# Patient Record
Sex: Male | Born: 1945
Health system: Southern US, Community
[De-identification: ages and names within clinical notes are randomized; demographics above are authoritative.]

## PROBLEM LIST (undated history)

## (undated) DIAGNOSIS — Z7901 Long term (current) use of anticoagulants: Secondary | ICD-10-CM

## (undated) DIAGNOSIS — I10 Essential (primary) hypertension: Secondary | ICD-10-CM

## (undated) DIAGNOSIS — E785 Hyperlipidemia, unspecified: Secondary | ICD-10-CM

## (undated) DIAGNOSIS — I4891 Unspecified atrial fibrillation: Secondary | ICD-10-CM

## (undated) DIAGNOSIS — Z720 Tobacco use: Secondary | ICD-10-CM

## (undated) HISTORY — DX: Hyperlipidemia, unspecified: E78.5

## (undated) HISTORY — DX: Essential (primary) hypertension: I10

## (undated) HISTORY — DX: Unspecified atrial fibrillation: I48.91

## (undated) HISTORY — PX: CARDIOVERSION: SHX1299

## (undated) HISTORY — DX: Tobacco use: Z72.0

## (undated) HISTORY — DX: Long term (current) use of anticoagulants: Z79.01

## (undated) HISTORY — PX: TONSILLECTOMY: SHX5217

## (undated) HISTORY — PX: NOSE SURGERY: SHX723

---

## 1997-09-17 ENCOUNTER — Emergency Department (HOSPITAL_COMMUNITY): Admission: EM | Admit: 1997-09-17 | Discharge: 1997-09-17 | Payer: Self-pay | Admitting: Unknown Physician Specialty

## 1999-03-29 ENCOUNTER — Encounter: Payer: Self-pay | Admitting: Family Medicine

## 1999-03-29 ENCOUNTER — Encounter: Admission: RE | Admit: 1999-03-29 | Discharge: 1999-03-29 | Payer: Self-pay | Admitting: Family Medicine

## 2003-01-03 ENCOUNTER — Encounter: Admission: RE | Admit: 2003-01-03 | Discharge: 2003-01-03 | Payer: Self-pay | Admitting: Family Medicine

## 2003-01-03 ENCOUNTER — Encounter: Payer: Self-pay | Admitting: Family Medicine

## 2003-01-10 ENCOUNTER — Encounter: Admission: RE | Admit: 2003-01-10 | Discharge: 2003-01-10 | Payer: Self-pay | Admitting: Family Medicine

## 2003-01-10 ENCOUNTER — Encounter: Payer: Self-pay | Admitting: Family Medicine

## 2003-01-15 ENCOUNTER — Ambulatory Visit (HOSPITAL_COMMUNITY): Admission: RE | Admit: 2003-01-15 | Discharge: 2003-01-15 | Payer: Self-pay | Admitting: Cardiovascular Disease

## 2003-01-15 HISTORY — PX: CARDIAC CATHETERIZATION: SHX172

## 2003-12-02 ENCOUNTER — Encounter (INDEPENDENT_AMBULATORY_CARE_PROVIDER_SITE_OTHER): Payer: Self-pay | Admitting: Specialist

## 2003-12-02 ENCOUNTER — Ambulatory Visit (HOSPITAL_COMMUNITY): Admission: RE | Admit: 2003-12-02 | Discharge: 2003-12-02 | Payer: Self-pay | Admitting: Gastroenterology

## 2003-12-02 HISTORY — PX: COLONOSCOPY W/ POLYPECTOMY: SHX1380

## 2004-11-19 ENCOUNTER — Ambulatory Visit (HOSPITAL_COMMUNITY): Admission: RE | Admit: 2004-11-19 | Discharge: 2004-11-19 | Payer: Self-pay | Admitting: *Deleted

## 2005-09-09 ENCOUNTER — Encounter: Admission: RE | Admit: 2005-09-09 | Discharge: 2005-09-09 | Payer: Self-pay | Admitting: Family Medicine

## 2005-09-28 ENCOUNTER — Encounter: Admission: RE | Admit: 2005-09-28 | Discharge: 2005-09-28 | Payer: Self-pay | Admitting: Family Medicine

## 2009-11-17 ENCOUNTER — Ambulatory Visit: Payer: Self-pay | Admitting: Cardiovascular Disease

## 2009-12-09 ENCOUNTER — Ambulatory Visit: Payer: Self-pay | Admitting: Cardiovascular Disease

## 2010-01-06 ENCOUNTER — Ambulatory Visit: Payer: Self-pay | Admitting: Cardiovascular Disease

## 2010-02-10 ENCOUNTER — Ambulatory Visit: Payer: Self-pay | Admitting: Cardiovascular Disease

## 2010-03-10 ENCOUNTER — Ambulatory Visit: Payer: Self-pay | Admitting: Cardiology

## 2010-03-18 ENCOUNTER — Ambulatory Visit: Payer: Self-pay | Admitting: Cardiology

## 2010-04-16 ENCOUNTER — Ambulatory Visit: Payer: Self-pay | Admitting: Cardiology

## 2010-05-18 ENCOUNTER — Ambulatory Visit: Payer: Self-pay | Admitting: Cardiovascular Disease

## 2010-06-16 ENCOUNTER — Encounter (INDEPENDENT_AMBULATORY_CARE_PROVIDER_SITE_OTHER): Payer: Medicare Other

## 2010-06-16 DIAGNOSIS — I4891 Unspecified atrial fibrillation: Secondary | ICD-10-CM

## 2010-06-16 DIAGNOSIS — Z7901 Long term (current) use of anticoagulants: Secondary | ICD-10-CM

## 2010-06-30 ENCOUNTER — Encounter (INDEPENDENT_AMBULATORY_CARE_PROVIDER_SITE_OTHER): Payer: Medicare Other

## 2010-06-30 DIAGNOSIS — Z7901 Long term (current) use of anticoagulants: Secondary | ICD-10-CM

## 2010-06-30 DIAGNOSIS — I4891 Unspecified atrial fibrillation: Secondary | ICD-10-CM

## 2010-07-13 ENCOUNTER — Ambulatory Visit (INDEPENDENT_AMBULATORY_CARE_PROVIDER_SITE_OTHER): Payer: Medicare Other | Admitting: *Deleted

## 2010-07-13 DIAGNOSIS — Z7901 Long term (current) use of anticoagulants: Secondary | ICD-10-CM

## 2010-07-13 DIAGNOSIS — I4891 Unspecified atrial fibrillation: Secondary | ICD-10-CM

## 2010-08-10 ENCOUNTER — Encounter: Payer: Medicare Other | Admitting: *Deleted

## 2010-08-24 ENCOUNTER — Ambulatory Visit (INDEPENDENT_AMBULATORY_CARE_PROVIDER_SITE_OTHER): Payer: Medicare Other | Admitting: *Deleted

## 2010-08-24 DIAGNOSIS — I4891 Unspecified atrial fibrillation: Secondary | ICD-10-CM

## 2010-08-24 LAB — POCT INR: INR: 2.9

## 2010-09-03 NOTE — H&P (Signed)
NAME:  John Buckley, John Buckley                        ACCOUNT NO.:  [id number]   MEDICAL RECORD NO.:  [id number]                   PATIENT TYPE:  OIB   LOCATION:  NA                                   FACILITY:  [facility]   PHYSICIAN:  Vesta Mixer, M.D.              DATE OF BIRTH:  02-16-1946   DATE OF ADMISSION:  DATE OF DISCHARGE:                                HISTORY & PHYSICAL   HISTORY OF PRESENT ILLNESS:  [name] is a 65 year old gentleman with a  history of hypertension and hypercholesterolemia.  He was recently seen in  our office for a treadmill test for further evaluation of some shortness of  breath.   [name] has had some fatigue and shortness of breath over the past  several months.  It all started as what sounded like a viral illness.  He  stated that most of these symptoms resolved but he still has some mild  shortness of breath.  He has never had any real episodes of chest  discomfort.  These episodes of shortness of breath typically occur with  exertion.  They are not associated with eating, drinking, changing position,  or taking a deep breath.   CURRENT MEDICATIONS:  1. Norvasc 10 mg a day.  2. Doxazosin 2 mg a day.  3. Lipitor 40 mg a day.  4. Aspirin 325 mg a day.   ALLERGIES:  No known drug allergies.   PAST MEDICAL HISTORY:  1. Hypertension.  2. Hypercholesterolemia.   SOCIAL HISTORY:  The patient works in Training and development officer for [employer].  He  does not get any regular exercise.  He smoked one-and-a-half packs of  cigarettes for the past 31 years.  He drinks beer regularly.   FAMILY HISTORY:  His father is 50 years old and still alive.  His mother is  62 years old and has Alzheimer's disease.   REVIEW OF SYSTEMS:  His review of systems was reviewed.  He denies any  problems with his eyes, ears, nose, and throat.  He denies any heat or cold  intolerance, or weight gain or weight loss.  He denies any cough or sputum  production.  He has the  shortness of breath as noted above.  He denies any  PND, orthopnea, syncope, or presyncope.  He denies any GI or GU problems.  He denies any rash or skin nodules.  His review of systems was reviewed and  is otherwise negative.   PHYSICAL EXAMINATION:  GENERAL:  He is a middle-aged gentleman in no acute  distress.  He is alert and oriented x3 and his mood and affect are normal.  VITAL SIGNS:  His weight is 293.  His blood pressure is 138/80 with a heart  rate of 72.  HEENT:  Reveals 2+ carotids.  He has no bruits.  There is no JVD, no  thyromegaly.  LUNGS:  Clear to auscultation.  HEART:  Regular rate, S1, S2.  He has no murmurs, gallops, or rubs.  ABDOMEN:  Reveals good bowel sounds and is nontender.  EXTREMITIES:  He has no clubbing, cyanosis, or edema.  NEUROLOGIC:  Nonfocal.   LABORATORY DATA:  The stress test today:  The patient walked for eight  minutes which is two minutes into stage 3.  He stopped due to generalized  fatigue and denied any chest pain.  He did have a moderate amount of  dyspnea.  He had several PVCs and several couplets during and after the  treadmill test.  At peak exercise he had no ST or T wave changes to suggest  ischemia.  He had one three-beat run of nonsustained ventricular tachycardia  during the recovery phase.   This is interpreted as an abnormal stress test because of his marked  ventricular ectopy.  I think that he is at increased risk for having  cardiovascular complications given this ectopy.  I have recommended that we  proceed with heart catheterization.  We discussed the risks, benefits, and  options of heart catheterization.  He understands and agrees to proceed.                                                Vesta Mixer, M.D.    PJN/MEDQ  D:  01/13/2003  T:  01/13/2003  Job:  [id number]   cc:   Talmadge Coventry, M.D.  [address]  [place]  [place] [address]  Fax: [phone]

## 2010-09-03 NOTE — H&P (Signed)
NAME:  John, Buckley NO.:  [id number]   MEDICAL RECORD NO.:  [id number]          PATIENT TYPE:  OIB   LOCATION:                               FACILITY:  [facility]   PHYSICIAN:  Vesta Mixer, M.D. DATE OF BIRTH:  [birth year]   DATE OF ADMISSION:  11/19/2004  DATE OF DISCHARGE:                                HISTORY & PHYSICAL   John Buckley is a middle-aged gentleman with a history of atrial fibrillation.  He  is now admitted to the hospital for elective cardioversion.   John Buckley has a history of hypertension, hypercholesterolemia, and has  had an abnormal stress test.  He had a heart catheterization back in  September 2004 which revealed only minor luminal coronary artery  irregularities.  He had atrial fibrillation recently.  We have had him on  Coumadin, and he has done fairly well.  He has not had any episodes of chest  pain, shortness of breath, syncope, or presyncope. He has been therapeutic  on his Coumadin now for several weeks and is scheduled for cardioversion.   CURRENT MEDICATIONS:  1.  Doxazosin 1 mg a day.  2.  Lipitor 40 mg a day.  3.  Aspirin 81 mg a day.  4.  Lotrel 10 mg/20 mg once a day.  5.  Coumadin 5 mg 7 days a week.  6.  Multivitamin once a day.  7.  Toprol XL 50 mg a day.  8.  Digoxin 0.25 mg a day.   PHYSICAL EXAMINATION:  GENERAL:  He is a middle-aged gentleman in no acute  distress.  He is alert and oriented x 3, and his mood and affect are normal.  VITAL SIGNS: Weight 280.  Blood pressure 110/70, heart rate 68.  HEENT:  Reveals 2+ carotids.  He has no bruits, no JVD, no thyromegaly.  LUNGS:  Clear to auscultation.  HEART:  Irregularly irregular.  ABDOMEN:  Obese but otherwise benign.  EXTREMITIES:  No clubbing, cyanosis, or edema.  NEUROLOGIC:  Exam was nonfocal.   ASSESSMENT AND PLAN:  Overall, John Buckley is doing fairly well.  We have  scheduled him for a cardioversion. We discussed the risks, benefits, and  options  concerning cardioversion.  He understands and agrees to proceed.   Once again I have reminded him to quit smoking.  I think that this will help  reduce his cardiovascular risks.  I will see him back in the office a week  or so following the cardioversion.           ______________________________  Vesta Mixer, M.D.     PJN/MEDQ  D:  11/02/2004  T:  11/02/2004  Job:  [id number]   cc:   Talmadge Coventry, M.D.  [address]  [place]  [place] [address]  Fax: [phone]

## 2010-09-03 NOTE — Op Note (Signed)
NAME:  John Buckley, John Buckley                        ACCOUNT NO.:  [id number]   MEDICAL RECORD NO.:  [id number]                   PATIENT TYPE:  AMB   LOCATION:  ENDO                                 FACILITY:  [facility]   PHYSICIAN:  John C. Madilyn Fireman, M.D.                 DATE OF BIRTH:  1945/05/22   DATE OF PROCEDURE:  12/02/2003  DATE OF DISCHARGE:                                 OPERATIVE REPORT   PROCEDURE:  Colonoscopy with polypectomy.   INDICATION FOR PROCEDURE:  Family history of colon cancer in a first degree  relative.   PROCEDURE:  The patient was placed in the left lateral decubitus position  and placed on a pulse monitor with continuous low-flow oxygen delivered by  nasal cannula.  He was sedated with 100 mcg IV fentanyl and 10 mg IV Versed.  The Olympus video colonoscope was inserted into the rectum and advanced to  the cecum, confirmed by transillumination of McBurney's point and  visualization of the ileocecal valve and the appendiceal orifice.  The prep  was fairly good but suboptimal in some areas, and I could not rule out  lesions less than 1 cm in all areas.  Otherwise, the cecum, ascending, and  transverse colon all appeared normal with no masses, polyps, diverticula, or  other mucosal abnormalities.  Within the descending and sigmoid colon there  were seen a few scattered diverticula.  In the rectum at 15 cm there was  seen a vaguely-delineated sessile polyp approximately 1.2-1.4 cm in  diameter.  An attempt was made to remove it with a snare, but the snare  inadvertently cut through part of the polyp, causing some bleeding and  obscuring the view thereafter.  I then snared what appeared to be the  remainder of the polyp, although it was somewhat vaguely delineated from the  surrounding mucosa and applied another snare and removed a second fragment.  It was not entirely clear whether the entire lesion was removed.  The  remainder of the rectum appeared normal.  The scope was  then withdrawn and  the patient returned to the recovery room in stable condition.  He tolerated  the procedure well, and there were no immediate complications.   IMPRESSION:  A moderate-sized rectal polyp at 15 cm.   PLAN:  Await histology to rule out malignancy and if villous component, will  probably repeat flexible sigmoidoscopy in six weeks to ensure that the  lesion was completely removed.                                               John C. Madilyn Fireman, M.D.    JCH/MEDQ  D:  12/02/2003  T:  12/02/2003  Job:  [id number]   cc:   Talmadge Coventry, M.D.  [address]  [place]  [place] [address]  Fax: [phone]

## 2010-09-03 NOTE — Cardiovascular Report (Signed)
   NAME:  John Buckley, John Buckley                        ACCOUNT NO.:  [id number]   MEDICAL RECORD NO.:  [id number]                   PATIENT TYPE:  OIB   LOCATION:  2855                                 FACILITY:  [facility]   PHYSICIAN:  Vesta Mixer, M.D.              DATE OF BIRTH:  02/26/1946   DATE OF PROCEDURE:  01/15/2003  DATE OF DISCHARGE:                              CARDIAC CATHETERIZATION   INDICATIONS:  [name] is a 65 year old gentleman who was referred for  heart catheterization after having an abnormal stress test.   PROCEDURE:  Left heart catheterization with coronary angiography.   The right femoral artery was easily cannulated using a modified Seldinger  technique.   HEMODYNAMICS:  The left ventricular pressure was 159/11 with an aortic  pressure of 159/116.   ANGIOGRAPHY:  The left main coronary artery is smooth and normal.   The left anterior descending artery has minor luminal irregularities, but is  otherwise normal.  The first diagonal vessel was normal.   The left circumflex artery is a large vessel.  There is a 20% stenosis at  the takeoff of the left circumflex artery but it is otherwise normal.   The ramus intermediate vessel is relatively normal.   The right coronary artery is large and dominant.  There are a few minor  luminal irregularities but the vessel is otherwise normal.   The posterior descending artery and the posterolateral segment artery are  normal.   LEFT VENTRICULOGRAM:  The left ventriculogram was performed in a 30 RAO  position.  It reveals normal left ventricular systolic function.  There are  no segmental wall motion abnormalities.  The ejection fraction is around  65%.   COMPLICATIONS:  None.    CONCLUSION:  1. Relatively smooth coronary arteries.  He has a few minor luminal     irregularities.  2. Normal left ventricular systolic function.                                               Vesta Mixer, M.D.    PJN/MEDQ  D:  01/15/2003  T:  01/15/2003  Job:  [id number]   cc:   Talmadge Coventry, M.D.  [address]  [place]  [place] [address]  Fax: [phone]

## 2010-09-21 ENCOUNTER — Ambulatory Visit (INDEPENDENT_AMBULATORY_CARE_PROVIDER_SITE_OTHER): Payer: Medicare Other | Admitting: *Deleted

## 2010-09-21 DIAGNOSIS — I4891 Unspecified atrial fibrillation: Secondary | ICD-10-CM

## 2010-10-19 ENCOUNTER — Ambulatory Visit (INDEPENDENT_AMBULATORY_CARE_PROVIDER_SITE_OTHER): Payer: Medicare Other | Admitting: *Deleted

## 2010-10-19 DIAGNOSIS — I4891 Unspecified atrial fibrillation: Secondary | ICD-10-CM

## 2010-11-04 ENCOUNTER — Encounter: Payer: Self-pay | Admitting: *Deleted

## 2010-11-05 ENCOUNTER — Ambulatory Visit (INDEPENDENT_AMBULATORY_CARE_PROVIDER_SITE_OTHER): Payer: Medicare Other | Admitting: Cardiovascular Disease

## 2010-11-05 ENCOUNTER — Encounter: Payer: Self-pay | Admitting: Cardiovascular Disease

## 2010-11-05 VITALS — BP 135/95 | HR 56 | Ht 74.0 in | Wt 292.0 lb

## 2010-11-05 DIAGNOSIS — I4891 Unspecified atrial fibrillation: Secondary | ICD-10-CM

## 2010-11-05 DIAGNOSIS — E785 Hyperlipidemia, unspecified: Secondary | ICD-10-CM

## 2010-11-05 DIAGNOSIS — I10 Essential (primary) hypertension: Secondary | ICD-10-CM

## 2010-11-05 DIAGNOSIS — E669 Obesity, unspecified: Secondary | ICD-10-CM

## 2010-11-05 MED ORDER — LISINOPRIL 20 MG PO TABS: 20.0000 mg | ORAL_TABLET | Freq: Every day | ORAL | Status: DC

## 2010-11-05 NOTE — Assessment & Plan Note (Signed)
His rhythm remained stable. We'll continue with the same medications.

## 2010-11-05 NOTE — Assessment & Plan Note (Signed)
He is to continue with his atorvastatin. We'll check a fasting lipid profile today and again in 6 months.

## 2010-11-05 NOTE — Progress Notes (Signed)
John Buckley Date of Birth  01-Feb-1946 [facility] / [facility] [address]     [address] [place], [place]  [address] [phone]  Fax  [phone]  History of Present Illness:  John Buckley is a 65 year old gentleman with a history of atrial fibrillation, obesity, hyperlipidemia, chronic anticoagulation, hypertension, and cigarette smoking. He has done very well since I last saw him. His heart rhythm has stayed fairly stable.  Current Outpatient Prescriptions on File Prior to Visit  Medication Sig Dispense Refill  . amLODipine (NORVASC) 5 MG tablet Take 2.5 mg by mouth daily.       Marland Kitchen aspirin 81 MG tablet Take 81 mg by mouth daily.        Marland Kitchen atorvastatin (LIPITOR) 40 MG tablet Take 40 mg by mouth daily.       . Cholecalciferol (VITAMIN D3) 2000 UNITS capsule Take 4,000 Units by mouth daily.        . digoxin (LANOXIN) 0.25 MG tablet Take 250 mcg by mouth daily.       . metoprolol (TOPROL-XL) 200 MG 24 hr tablet Take 200 mg by mouth daily.       . multivitamin (THERAGRAN) per tablet Take 1 tablet by mouth daily.        Marland Kitchen warfarin (COUMADIN) 5 MG tablet Take by mouth. Take as directed by the [facility]        No Known Allergies  Past Medical History  Diagnosis Date  . Hyperlipidemia   . Hypertension   . Atrial fibrillation   . Long-term (current) use of anticoagulants   . Tobacco abuse     Past Surgical History  Procedure Date  . Cardiac catheterization 01/15/2003    Est. EF is around 65% --  Relatively smooth coronary arteries.  He has a few minor luminal irregularities -- Normal left ventricular systolic function -- John Buckley, M.D.  . Colonoscopy w/ polypectomy 12/02/2003    A moderate-sized rectal polyp at 15 cm -- ohn C. Madilyn Buckley, M.D.  . Cardioversion 08/04/20006    was successful  . Tonsillectomy   . Nose surgery     History  Smoking status  . Current Everyday Smoker -- 0.5 packs/day for 39 years  . Types: Cigarettes  Smokeless  tobacco  . Not on file    History  Alcohol Use No    Family History  Problem Relation Age of Onset  . Alzheimer's disease Mother   . Colon cancer      family history of     Reviw of Systems:  Reviewed in the HPI.  All other systems are negative.  Physical Exam: BP 135/95  Pulse 56  Ht 6\' 2"  (1.88 m)  Wt 292 lb (132.45 kg)  BMI 37.49 kg/m2 The patient is alert and oriented x 3.  The mood and affect are normal.   Skin: warm and dry.  Color is normal.    HEENT:   the sclera are nonicteric.  The mucous membranes are moist.  The carotids are 2+ without bruits.  There is no thyromegaly.  There is no JVD.    Lungs: clear.  The chest wall is non tender.    Heart: regular rate with a normal S1 and S2.  There are no murmurs, gallops, or rubs. The PMI is not displaced.     Abdomin: good bowel sounds.  There is no guarding or rebound.  There is no hepatosplenomegaly or tenderness.  There are no masses.  Extremities:  no clubbing, cyanosis, or edema.  The legs are without rashes.  The distal pulses are intact.   Neuro:  Cranial nerves II - XII are intact.  Motor and sensory functions are intact.    The gait is normal.  ECG:  Assessment / Plan:

## 2010-11-05 NOTE — Assessment & Plan Note (Signed)
His blood pressure remains elevated today. We'll increase his Isordil to 20 mg a day. We may need to start HCTZ and potassium at his next visit.  I've asked him to exercise on a regular basis and to make sure he watches his salt. Rest and to lose weight which will help with his blood pressure control.

## 2010-11-10 ENCOUNTER — Other Ambulatory Visit (INDEPENDENT_AMBULATORY_CARE_PROVIDER_SITE_OTHER): Payer: Medicare Other | Admitting: *Deleted

## 2010-11-10 LAB — HEPATIC FUNCTION PANEL
Albumin: 4.6 g/dL (ref 3.5–5.2)
Alkaline Phosphatase: 87 U/L (ref 39–117)
Total Bilirubin: 1 mg/dL (ref 0.3–1.2)
Total Protein: 7.5 g/dL (ref 6.0–8.3)

## 2010-11-10 LAB — BASIC METABOLIC PANEL
BUN: 20 mg/dL (ref 6–23)
Glucose, Bld: 98 mg/dL (ref 70–99)
Potassium: 4.4 mEq/L (ref 3.5–5.1)
Sodium: 142 mEq/L (ref 135–145)

## 2010-11-10 LAB — LIPID PANEL
Cholesterol: 136 mg/dL (ref 0–200)
LDL Cholesterol: 59 mg/dL (ref 0–99)
Triglycerides: 156 mg/dL — ABNORMAL HIGH (ref 0.0–149.0)

## 2010-11-11 ENCOUNTER — Telehealth: Payer: Self-pay | Admitting: *Deleted

## 2010-11-11 NOTE — Telephone Encounter (Signed)
Message copied by Antony Odea on Thu Nov 11, 2010  4:59 PM ------      Message from: Vesta Mixer      Created: Thu Nov 11, 2010  6:01 AM       Needs to continue a low fat diet and lose weight to lower trigs

## 2010-11-11 NOTE — Telephone Encounter (Signed)
Patient called with lab results. Pt verbalized understanding. Jodette Timohty Renbarger RN  

## 2010-11-16 ENCOUNTER — Ambulatory Visit (INDEPENDENT_AMBULATORY_CARE_PROVIDER_SITE_OTHER): Payer: Medicare Other | Admitting: *Deleted

## 2010-11-16 DIAGNOSIS — I4891 Unspecified atrial fibrillation: Secondary | ICD-10-CM

## 2010-11-16 LAB — POCT INR: INR: 3.4

## 2010-12-13 ENCOUNTER — Ambulatory Visit (INDEPENDENT_AMBULATORY_CARE_PROVIDER_SITE_OTHER): Payer: Medicare Other | Admitting: *Deleted

## 2010-12-13 DIAGNOSIS — I4891 Unspecified atrial fibrillation: Secondary | ICD-10-CM

## 2010-12-13 LAB — POCT INR: INR: 2.9

## 2011-01-10 ENCOUNTER — Ambulatory Visit (INDEPENDENT_AMBULATORY_CARE_PROVIDER_SITE_OTHER): Payer: Medicare Other | Admitting: *Deleted

## 2011-01-10 DIAGNOSIS — I4891 Unspecified atrial fibrillation: Secondary | ICD-10-CM

## 2011-01-10 LAB — POCT INR: INR: 2.4

## 2011-02-07 ENCOUNTER — Ambulatory Visit (INDEPENDENT_AMBULATORY_CARE_PROVIDER_SITE_OTHER): Payer: Medicare Other | Admitting: *Deleted

## 2011-02-07 DIAGNOSIS — I4891 Unspecified atrial fibrillation: Secondary | ICD-10-CM

## 2011-03-21 ENCOUNTER — Ambulatory Visit (INDEPENDENT_AMBULATORY_CARE_PROVIDER_SITE_OTHER): Payer: Medicare Other | Admitting: *Deleted

## 2011-03-21 DIAGNOSIS — I4891 Unspecified atrial fibrillation: Secondary | ICD-10-CM

## 2011-03-21 LAB — POCT INR: INR: 2.5

## 2011-05-04 ENCOUNTER — Ambulatory Visit (INDEPENDENT_AMBULATORY_CARE_PROVIDER_SITE_OTHER): Payer: Medicare Other | Admitting: *Deleted

## 2011-05-04 ENCOUNTER — Encounter: Payer: Self-pay | Admitting: Cardiovascular Disease

## 2011-05-04 ENCOUNTER — Ambulatory Visit (INDEPENDENT_AMBULATORY_CARE_PROVIDER_SITE_OTHER): Payer: Medicare Other | Admitting: Cardiovascular Disease

## 2011-05-04 DIAGNOSIS — I10 Essential (primary) hypertension: Secondary | ICD-10-CM | POA: Diagnosis not present

## 2011-05-04 DIAGNOSIS — I4891 Unspecified atrial fibrillation: Secondary | ICD-10-CM | POA: Diagnosis not present

## 2011-05-04 DIAGNOSIS — E785 Hyperlipidemia, unspecified: Secondary | ICD-10-CM

## 2011-05-04 LAB — LIPID PANEL
Cholesterol: 157 mg/dL (ref 0–200)
HDL: 46.9 mg/dL (ref >39.00)
LDL Cholesterol: 75 mg/dL (ref 0–99)
Total CHOL/HDL Ratio: 3
Triglycerides: 175 mg/dL — ABNORMAL HIGH (ref 0.0–149.0)
VLDL: 35 mg/dL (ref 0.0–40.0)

## 2011-05-04 LAB — BASIC METABOLIC PANEL
BUN: 17 mg/dL (ref 6–23)
Calcium: 9 mg/dL (ref 8.4–10.5)
Chloride: 104 mEq/L (ref 96–112)
Creatinine, Ser: 1 mg/dL (ref 0.4–1.5)
GFR: 79.36 mL/min (ref >60.00)

## 2011-05-04 LAB — HEPATIC FUNCTION PANEL
AST: 24 U/L (ref 0–37)
Alkaline Phosphatase: 76 U/L (ref 39–117)
Total Protein: 7 g/dL (ref 6.0–8.3)

## 2011-05-04 MED ORDER — AMLODIPINE BESYLATE 5 MG PO TABS: 2.5000 mg | ORAL_TABLET | Freq: Every day | ORAL | Status: DC

## 2011-05-04 MED ORDER — LISINOPRIL 20 MG PO TABS: 20.0000 mg | ORAL_TABLET | Freq: Every day | ORAL | Status: DC

## 2011-05-04 MED ORDER — ATORVASTATIN CALCIUM 40 MG PO TABS: 40.0000 mg | ORAL_TABLET | Freq: Every day | ORAL | Status: DC

## 2011-05-04 MED ORDER — DIGOXIN 250 MCG PO TABS: 250.0000 ug | ORAL_TABLET | Freq: Every day | ORAL | Status: DC

## 2011-05-04 MED ORDER — METOPROLOL SUCCINATE ER 200 MG PO TB24: 200.0000 mg | ORAL_TABLET | Freq: Every day | ORAL | Status: DC

## 2011-05-04 NOTE — Progress Notes (Signed)
[name] Date of Birth  1945/06/17 [facility]     [place] Office  [address]    [address]   [address] [place], [place]  [address]    [place], [place]  [address] [phone]  Fax  [phone]  [phone]  Fax [phone]   History of Present Illness:  John Buckley is a 66 year old gentleman with history of chronic atrial fibrillation, obesity, hyperlipidemia, chronic anticoagulation, hypertension, and ongoing cigarette smoking. He's done very well since I last saw him. Unfortunately he still smokes about a pack of cigarettes a day. He's not had any particular problems.  During his last visit we increased his lisinopril to 20 mg a day which resulted in a well controlled blood pressure. Unfortunately he  reduced his dose back down to 10 mg a day because he thought he didn't need the extra dose.  He is feeling quite well. He denies any episodes of chest pain or shortness of breath.  Current Outpatient Prescriptions on File Prior to Visit  Medication Sig Dispense Refill  . amLODipine (NORVASC) 5 MG tablet Take 2.5 mg by mouth daily.       Marland Kitchen aspirin 81 MG tablet Take 81 mg by mouth daily.        Marland Kitchen atorvastatin (LIPITOR) 40 MG tablet Take 40 mg by mouth daily.       . Cholecalciferol (VITAMIN D3) 2000 UNITS capsule Take 4,000 Units by mouth daily.        . digoxin (LANOXIN) 0.25 MG tablet Take 250 mcg by mouth daily.       . metoprolol (TOPROL-XL) 200 MG 24 hr tablet Take 200 mg by mouth daily.       . multivitamin (THERAGRAN) per tablet Take 1 tablet by mouth daily.        Marland Kitchen warfarin (COUMADIN) 5 MG tablet Take by mouth. Take as directed by the [facility]        No Known Allergies  Past Medical History  Diagnosis Date  . Hyperlipidemia   . Hypertension   . Atrial fibrillation   . Long-term (current) use of anticoagulants   . Tobacco abuse     Past Surgical History  Procedure Date  . Cardiac catheterization 01/15/2003    Est. EF is around 65% --   Relatively smooth coronary arteries.  He has a few minor luminal irregularities -- Normal left ventricular systolic function -- Vesta Mixer, M.D.  . Colonoscopy w/ polypectomy 12/02/2003    A moderate-sized rectal polyp at 15 cm -- [name], M.D.  . Cardioversion 08/04/20006    was successful  . Tonsillectomy   . Nose surgery     History  Smoking status  . Current Everyday Smoker -- 0.5 packs/day for 39 years  . Types: Cigarettes  Smokeless tobacco  . Not on file    History  Alcohol Use No    Family History  Problem Relation Age of Onset  . Alzheimer's disease Mother   . Colon cancer      family history of     Reviw of Systems:  Reviewed in the HPI.  All other systems are negative.  Physical Exam: BP 146/92  Pulse 68  Ht 6\' 2"  (1.88 m)  Wt 286 lb (129.729 kg)  BMI 36.72 kg/m2 The patient is alert and oriented x 3.  The mood and affect are normal.   Skin: warm and dry.  Color is normal.    HEENT:  His mucous membranes are moist. His neck is supple. No JVD. Normal carotids.  Lungs: Lungs are clear.   Heart: Irregularly irregular.    Abdomen: He is mildly obese. He is good bowel sounds. There is no hepatosplenomegaly.  Extremities:  No clubbing cyanosis or edema the  Neuro:  There exam is nonfocal    ECG: Atrial fibrillation. His rate is well controlled.  Assessment / Plan:

## 2011-05-04 NOTE — Assessment & Plan Note (Signed)
We will increase his lisinopril back up to 20 mg a day. I've asked him to watch his diet and to limit his salt intake. I have asked him to exercise on a daily basis and to try to stop smoking. I'll see him again in 6 months. We'll check fasting labs at that time.

## 2011-05-04 NOTE — Patient Instructions (Signed)
Your physician wants you to follow-up in: 6 months  You will receive a reminder letter in the mail two months in advance. If you don't receive a letter, please call our office to schedule the follow-up appointment.  Your physician recommends that you return for a FASTING lipid profile: 6 months  WATCH YOUR SALT/SODIUM INTAKE LIKE LUNCH MEAT, FAST FOODS, READY PREPARED FOODS IN FREEZER SECTION, CHINESE FOOD, MEXICAN FOOD ETC...  Your physician has recommended you make the following change in your medication:   TAKE LISINOPRIL 20 MG FOR HIGH BLOOD PRESSURE

## 2011-05-04 NOTE — Assessment & Plan Note (Signed)
We'll draw fasting lipids today as well as basic metabolic profile, hepatic profile, and lipids.

## 2011-05-10 ENCOUNTER — Other Ambulatory Visit: Payer: Self-pay | Admitting: Cardiovascular Disease

## 2011-05-10 DIAGNOSIS — E785 Hyperlipidemia, unspecified: Secondary | ICD-10-CM

## 2011-05-10 DIAGNOSIS — I4891 Unspecified atrial fibrillation: Secondary | ICD-10-CM

## 2011-05-10 DIAGNOSIS — I10 Essential (primary) hypertension: Secondary | ICD-10-CM

## 2011-05-10 MED ORDER — LISINOPRIL 20 MG PO TABS: 20.0000 mg | ORAL_TABLET | Freq: Every day | ORAL | Status: DC

## 2011-05-10 MED ORDER — DIGOXIN 250 MCG PO TABS: 250.0000 ug | ORAL_TABLET | Freq: Every day | ORAL | Status: DC

## 2011-05-10 MED ORDER — METOPROLOL SUCCINATE ER 200 MG PO TB24: 200.0000 mg | ORAL_TABLET | Freq: Every day | ORAL | Status: DC

## 2011-05-10 MED ORDER — AMLODIPINE BESYLATE 5 MG PO TABS: 2.5000 mg | ORAL_TABLET | Freq: Every day | ORAL | Status: DC

## 2011-05-10 MED ORDER — ATORVASTATIN CALCIUM 40 MG PO TABS: 40.0000 mg | ORAL_TABLET | Freq: Every day | ORAL | Status: AC

## 2011-05-10 MED ORDER — WARFARIN SODIUM 5 MG PO TABS: ORAL_TABLET | ORAL | Status: DC

## 2011-05-10 NOTE — Telephone Encounter (Signed)
Pt was to get refill of six rx's for 90 day supply, only  Five were called in and called in for 30 , needs that changed to 90 days and his warfarin needs to be called in as well, [facility]

## 2011-05-24 DIAGNOSIS — I10 Essential (primary) hypertension: Secondary | ICD-10-CM | POA: Diagnosis not present

## 2011-05-24 DIAGNOSIS — N529 Male erectile dysfunction, unspecified: Secondary | ICD-10-CM | POA: Diagnosis not present

## 2011-06-15 ENCOUNTER — Ambulatory Visit (INDEPENDENT_AMBULATORY_CARE_PROVIDER_SITE_OTHER): Payer: Medicare Other | Admitting: *Deleted

## 2011-06-15 DIAGNOSIS — I4891 Unspecified atrial fibrillation: Secondary | ICD-10-CM

## 2011-07-27 ENCOUNTER — Ambulatory Visit (INDEPENDENT_AMBULATORY_CARE_PROVIDER_SITE_OTHER): Payer: Medicare Other | Admitting: *Deleted

## 2011-07-27 DIAGNOSIS — I4891 Unspecified atrial fibrillation: Secondary | ICD-10-CM

## 2011-07-27 DIAGNOSIS — Z7901 Long term (current) use of anticoagulants: Secondary | ICD-10-CM | POA: Diagnosis not present

## 2011-07-27 LAB — POCT INR: INR: 2.9

## 2011-09-07 ENCOUNTER — Ambulatory Visit (INDEPENDENT_AMBULATORY_CARE_PROVIDER_SITE_OTHER): Payer: Medicare Other | Admitting: Pharmacist

## 2011-09-07 DIAGNOSIS — I4891 Unspecified atrial fibrillation: Secondary | ICD-10-CM | POA: Diagnosis not present

## 2011-09-07 DIAGNOSIS — Z7901 Long term (current) use of anticoagulants: Secondary | ICD-10-CM | POA: Diagnosis not present

## 2011-09-07 LAB — POCT INR: INR: 3.4

## 2011-09-28 ENCOUNTER — Ambulatory Visit (INDEPENDENT_AMBULATORY_CARE_PROVIDER_SITE_OTHER): Payer: Medicare Other | Admitting: *Deleted

## 2011-09-28 DIAGNOSIS — I4891 Unspecified atrial fibrillation: Secondary | ICD-10-CM | POA: Diagnosis not present

## 2011-09-28 DIAGNOSIS — Z7901 Long term (current) use of anticoagulants: Secondary | ICD-10-CM

## 2011-09-28 LAB — POCT INR: INR: 2.3

## 2011-10-05 ENCOUNTER — Other Ambulatory Visit: Payer: Self-pay | Admitting: Cardiovascular Disease

## 2011-10-05 MED ORDER — WARFARIN SODIUM 5 MG PO TABS: ORAL_TABLET | ORAL | Status: DC

## 2011-10-05 NOTE — Telephone Encounter (Signed)
Pt needs refill of coumadin, [facility], 90 day refills

## 2011-10-26 ENCOUNTER — Ambulatory Visit (INDEPENDENT_AMBULATORY_CARE_PROVIDER_SITE_OTHER): Payer: Medicare Other | Admitting: Pharmacist

## 2011-10-26 DIAGNOSIS — I4891 Unspecified atrial fibrillation: Secondary | ICD-10-CM

## 2011-10-26 DIAGNOSIS — Z7901 Long term (current) use of anticoagulants: Secondary | ICD-10-CM

## 2011-10-26 LAB — POCT INR: INR: 2.4

## 2011-11-23 ENCOUNTER — Ambulatory Visit (INDEPENDENT_AMBULATORY_CARE_PROVIDER_SITE_OTHER): Payer: Medicare Other | Admitting: *Deleted

## 2011-11-23 DIAGNOSIS — Z7901 Long term (current) use of anticoagulants: Secondary | ICD-10-CM | POA: Diagnosis not present

## 2011-11-23 DIAGNOSIS — I4891 Unspecified atrial fibrillation: Secondary | ICD-10-CM | POA: Diagnosis not present

## 2011-11-24 DIAGNOSIS — I10 Essential (primary) hypertension: Secondary | ICD-10-CM | POA: Diagnosis not present

## 2011-11-24 DIAGNOSIS — Z1331 Encounter for screening for depression: Secondary | ICD-10-CM | POA: Diagnosis not present

## 2011-11-24 DIAGNOSIS — Z0000 Encounter for general adult medical examination without abnormal findings: Secondary | ICD-10-CM | POA: Diagnosis not present

## 2011-12-14 ENCOUNTER — Ambulatory Visit (INDEPENDENT_AMBULATORY_CARE_PROVIDER_SITE_OTHER): Payer: Medicare Other | Admitting: *Deleted

## 2011-12-14 DIAGNOSIS — I4891 Unspecified atrial fibrillation: Secondary | ICD-10-CM | POA: Diagnosis not present

## 2011-12-14 DIAGNOSIS — Z7901 Long term (current) use of anticoagulants: Secondary | ICD-10-CM

## 2011-12-14 LAB — POCT INR: INR: 3.1

## 2011-12-23 DIAGNOSIS — Z85828 Personal history of other malignant neoplasm of skin: Secondary | ICD-10-CM | POA: Diagnosis not present

## 2011-12-23 DIAGNOSIS — D235 Other benign neoplasm of skin of trunk: Secondary | ICD-10-CM | POA: Diagnosis not present

## 2012-01-04 ENCOUNTER — Ambulatory Visit (INDEPENDENT_AMBULATORY_CARE_PROVIDER_SITE_OTHER): Payer: Medicare Other | Admitting: Pharmacist

## 2012-01-04 DIAGNOSIS — I4891 Unspecified atrial fibrillation: Secondary | ICD-10-CM | POA: Diagnosis not present

## 2012-01-04 DIAGNOSIS — Z7901 Long term (current) use of anticoagulants: Secondary | ICD-10-CM

## 2012-01-04 LAB — POCT INR: INR: 2.3

## 2012-01-31 DIAGNOSIS — Z23 Encounter for immunization: Secondary | ICD-10-CM | POA: Diagnosis not present

## 2012-02-01 ENCOUNTER — Ambulatory Visit (INDEPENDENT_AMBULATORY_CARE_PROVIDER_SITE_OTHER): Payer: Medicare Other | Admitting: *Deleted

## 2012-02-01 DIAGNOSIS — I4891 Unspecified atrial fibrillation: Secondary | ICD-10-CM

## 2012-02-01 DIAGNOSIS — Z7901 Long term (current) use of anticoagulants: Secondary | ICD-10-CM | POA: Diagnosis not present

## 2012-02-29 ENCOUNTER — Ambulatory Visit (INDEPENDENT_AMBULATORY_CARE_PROVIDER_SITE_OTHER): Payer: Medicare Other | Admitting: *Deleted

## 2012-02-29 DIAGNOSIS — Z7901 Long term (current) use of anticoagulants: Secondary | ICD-10-CM | POA: Diagnosis not present

## 2012-02-29 DIAGNOSIS — I4891 Unspecified atrial fibrillation: Secondary | ICD-10-CM | POA: Diagnosis not present

## 2012-04-09 ENCOUNTER — Ambulatory Visit (INDEPENDENT_AMBULATORY_CARE_PROVIDER_SITE_OTHER): Payer: Medicare Other | Admitting: *Deleted

## 2012-04-09 DIAGNOSIS — I4891 Unspecified atrial fibrillation: Secondary | ICD-10-CM

## 2012-04-09 DIAGNOSIS — Z7901 Long term (current) use of anticoagulants: Secondary | ICD-10-CM

## 2012-04-23 ENCOUNTER — Ambulatory Visit (INDEPENDENT_AMBULATORY_CARE_PROVIDER_SITE_OTHER): Payer: Medicare Other | Admitting: *Deleted

## 2012-04-23 DIAGNOSIS — Z7901 Long term (current) use of anticoagulants: Secondary | ICD-10-CM | POA: Diagnosis not present

## 2012-04-23 DIAGNOSIS — I4891 Unspecified atrial fibrillation: Secondary | ICD-10-CM

## 2012-05-23 ENCOUNTER — Ambulatory Visit (INDEPENDENT_AMBULATORY_CARE_PROVIDER_SITE_OTHER): Payer: Medicare Other | Admitting: Pharmacist

## 2012-05-23 DIAGNOSIS — Z7901 Long term (current) use of anticoagulants: Secondary | ICD-10-CM | POA: Diagnosis not present

## 2012-05-23 DIAGNOSIS — I4891 Unspecified atrial fibrillation: Secondary | ICD-10-CM | POA: Diagnosis not present

## 2012-05-30 ENCOUNTER — Other Ambulatory Visit: Payer: Self-pay | Admitting: Internal Medicine

## 2012-05-30 DIAGNOSIS — E785 Hyperlipidemia, unspecified: Secondary | ICD-10-CM | POA: Diagnosis not present

## 2012-05-30 DIAGNOSIS — R918 Other nonspecific abnormal finding of lung field: Secondary | ICD-10-CM

## 2012-05-30 DIAGNOSIS — I10 Essential (primary) hypertension: Secondary | ICD-10-CM | POA: Diagnosis not present

## 2012-05-30 DIAGNOSIS — F17200 Nicotine dependence, unspecified, uncomplicated: Secondary | ICD-10-CM | POA: Diagnosis not present

## 2012-05-30 DIAGNOSIS — R05 Cough: Secondary | ICD-10-CM | POA: Diagnosis not present

## 2012-06-06 ENCOUNTER — Ambulatory Visit
Admission: RE | Admit: 2012-06-06 | Discharge: 2012-06-06 | Disposition: A | Discharge status: Home / Self Care | Payer: No Typology Code available for payment source | Source: Ambulatory Visit | Attending: Internal Medicine | Admitting: Internal Medicine

## 2012-07-04 ENCOUNTER — Ambulatory Visit (INDEPENDENT_AMBULATORY_CARE_PROVIDER_SITE_OTHER): Payer: Medicare Other | Admitting: *Deleted

## 2012-07-04 DIAGNOSIS — Z7901 Long term (current) use of anticoagulants: Secondary | ICD-10-CM | POA: Diagnosis not present

## 2012-07-13 ENCOUNTER — Encounter: Payer: Self-pay | Admitting: Cardiovascular Disease

## 2012-07-18 ENCOUNTER — Encounter: Payer: Self-pay | Admitting: Cardiovascular Disease

## 2012-07-18 ENCOUNTER — Other Ambulatory Visit: Payer: Self-pay | Admitting: Pharmacist

## 2012-07-18 ENCOUNTER — Ambulatory Visit (INDEPENDENT_AMBULATORY_CARE_PROVIDER_SITE_OTHER): Payer: Medicare Other | Admitting: Cardiovascular Disease

## 2012-07-18 VITALS — BP 114/74 | HR 53 | Ht 74.0 in | Wt 283.0 lb

## 2012-07-18 DIAGNOSIS — E785 Hyperlipidemia, unspecified: Secondary | ICD-10-CM

## 2012-07-18 DIAGNOSIS — I4891 Unspecified atrial fibrillation: Secondary | ICD-10-CM

## 2012-07-18 DIAGNOSIS — I10 Essential (primary) hypertension: Secondary | ICD-10-CM

## 2012-07-18 MED ORDER — WARFARIN SODIUM 5 MG PO TABS: ORAL_TABLET | ORAL | Status: DC

## 2012-07-18 MED ORDER — DIGOXIN 250 MCG PO TABS: 250.0000 ug | ORAL_TABLET | Freq: Every day | ORAL | Status: DC

## 2012-07-18 NOTE — Assessment & Plan Note (Signed)
John Buckley is doing well.  Continue same meds.  Will see him again in 1 year.  He knows that he needs to stop smoking.

## 2012-07-18 NOTE — Progress Notes (Signed)
John Buckley Date of Birth  05-03-1945 [facility]     [place] Office  [address]    [address]   [address] [place], [place]  [address]    [place], [place]  [address] [phone]  Fax  [phone]  [phone]  Fax [phone]  Problems: 1. Atrial fibrillation 2. Obesity 3. Hyperlipidemia 4. Chronic ankle relation 5. Hypertension History of Present Illness:  John Buckley is a 67 year old gentleman with history of chronic atrial fibrillation, obesity, hyperlipidemia, chronic anticoagulation, hypertension, and ongoing cigarette smoking. He's done very well since I last saw him. Unfortunately he still smokes about a pack of cigarettes a day. He's not had any particular problems.  During his last visit we increased his lisinopril to 20 mg a day which resulted in a well controlled blood pressure. Unfortunately he  reduced his dose back down to 10 mg a day because he thought he didn't need the extra dose.  He is feeling quite well. He denies any episodes of chest pain or shortness of breath.  July 18, 2012:  John Buckley is doing well.  No chest pain, no dyspnea.  Getting some exercise.   Current Outpatient Prescriptions on File Prior to Visit  Medication Sig Dispense Refill  . amLODipine (NORVASC) 5 MG tablet Take 5 mg by mouth daily.      Marland Kitchen aspirin 81 MG tablet Take 81 mg by mouth daily.        Marland Kitchen atorvastatin (LIPITOR) 40 MG tablet Take 1 tablet (40 mg total) by mouth daily.  90 tablet  3  . Cholecalciferol (VITAMIN D3) 2000 UNITS capsule Take 4,000 Units by mouth daily.        . digoxin (LANOXIN) 0.25 MG tablet Take 1 tablet (250 mcg total) by mouth daily.  90 tablet  3  . lisinopril (PRINIVIL,ZESTRIL) 20 MG tablet Take 1 tablet (20 mg total) by mouth daily.  90 tablet  3  . metoprolol (TOPROL-XL) 200 MG 24 hr tablet Take 1 tablet (200 mg total) by mouth daily.  90 tablet  3  . multivitamin (THERAGRAN) per tablet Take 1 tablet by mouth daily.        Marland Kitchen warfarin  (COUMADIN) 5 MG tablet Take as directed by the [facility]  90 tablet  1   No current facility-administered medications on file prior to visit.    No Known Allergies  Past Medical History  Diagnosis Date  . Hyperlipidemia   . Hypertension   . Atrial fibrillation   . Long-term (current) use of anticoagulants   . Tobacco abuse     Past Surgical History  Procedure Laterality Date  . Cardiac catheterization  01/15/2003    Est. EF is around 65% --  Relatively smooth coronary arteries.  He has a few minor luminal irregularities -- Normal left ventricular systolic function -- Vesta Mixer, M.D.  . Colonoscopy w/ polypectomy  12/02/2003    A moderate-sized rectal polyp at 15 cm -- [name], M.D.  . Cardioversion  08/04/20006    was successful  . Tonsillectomy    . Nose surgery      History  Smoking status  . Current Every Day Smoker -- 0.50 packs/day for 39 years  . Types: Cigarettes  Smokeless tobacco  . Not on file    History  Alcohol Use No    Family History  Problem Relation Age of Onset  . Alzheimer's disease Mother   . Colon cancer  family history of     Reviw of Systems:  Reviewed in the HPI.  All other systems are negative.  Physical Exam: BP 114/74  Pulse 53  Ht 6\' 2"  (1.88 m)  Wt 283 lb (128.368 kg)  BMI 36.32 kg/m2 The patient is alert and oriented x 3.  The mood and affect are normal.   Skin: warm and dry.  Color is normal.    HEENT:   His mucous membranes are moist. His neck is supple. No JVD. Normal carotids.  Lungs: Lungs are clear.   Heart: Irregularly irregular.    Abdomen: He is mildly obese. He is good bowel sounds. There is no hepatosplenomegaly.  Extremities:  No clubbing cyanosis or edema the  Neuro:  There exam is nonfocal    ECG: July 18, 2012:  Atrial fibrillation. His rate is well controlled at 53  Assessment / Plan:

## 2012-07-18 NOTE — Patient Instructions (Signed)
Your physician recommends that you continue on your current medications as directed. Please refer to the Current Medication list given to you today.  Your physician wants you to follow-up in: 1 Year You will receive a reminder letter in the mail two months in advance. If you don't receive a letter, please call our office to schedule the follow-up appointment.  

## 2012-08-06 ENCOUNTER — Encounter: Payer: Self-pay | Admitting: *Deleted

## 2012-08-06 ENCOUNTER — Ambulatory Visit (INDEPENDENT_AMBULATORY_CARE_PROVIDER_SITE_OTHER): Payer: Medicare Other | Admitting: *Deleted

## 2012-08-06 DIAGNOSIS — Z7901 Long term (current) use of anticoagulants: Secondary | ICD-10-CM

## 2012-08-06 DIAGNOSIS — I4891 Unspecified atrial fibrillation: Secondary | ICD-10-CM

## 2012-08-27 ENCOUNTER — Ambulatory Visit (INDEPENDENT_AMBULATORY_CARE_PROVIDER_SITE_OTHER): Payer: Medicare Other | Admitting: Pharmacist

## 2012-08-27 DIAGNOSIS — I4891 Unspecified atrial fibrillation: Secondary | ICD-10-CM | POA: Diagnosis not present

## 2012-08-27 DIAGNOSIS — Z7901 Long term (current) use of anticoagulants: Secondary | ICD-10-CM

## 2012-10-08 ENCOUNTER — Ambulatory Visit (INDEPENDENT_AMBULATORY_CARE_PROVIDER_SITE_OTHER): Payer: Medicare Other | Admitting: Pharmacist

## 2012-10-08 DIAGNOSIS — Z7901 Long term (current) use of anticoagulants: Secondary | ICD-10-CM

## 2012-10-08 DIAGNOSIS — I4891 Unspecified atrial fibrillation: Secondary | ICD-10-CM

## 2012-11-05 ENCOUNTER — Ambulatory Visit (INDEPENDENT_AMBULATORY_CARE_PROVIDER_SITE_OTHER): Payer: Medicare Other | Admitting: Pharmacist

## 2012-11-05 DIAGNOSIS — I4891 Unspecified atrial fibrillation: Secondary | ICD-10-CM

## 2012-11-05 DIAGNOSIS — Z7901 Long term (current) use of anticoagulants: Secondary | ICD-10-CM | POA: Diagnosis not present

## 2012-11-05 LAB — POCT INR: INR: 2.6

## 2012-11-21 ENCOUNTER — Other Ambulatory Visit: Payer: Self-pay

## 2012-11-27 DIAGNOSIS — F17200 Nicotine dependence, unspecified, uncomplicated: Secondary | ICD-10-CM | POA: Diagnosis not present

## 2012-11-27 DIAGNOSIS — I10 Essential (primary) hypertension: Secondary | ICD-10-CM | POA: Diagnosis not present

## 2012-12-03 ENCOUNTER — Ambulatory Visit (INDEPENDENT_AMBULATORY_CARE_PROVIDER_SITE_OTHER): Payer: Medicare Other | Admitting: Pharmacist

## 2012-12-03 DIAGNOSIS — I4891 Unspecified atrial fibrillation: Secondary | ICD-10-CM

## 2012-12-03 DIAGNOSIS — Z7901 Long term (current) use of anticoagulants: Secondary | ICD-10-CM

## 2012-12-03 LAB — POCT INR: INR: 2.3

## 2012-12-31 ENCOUNTER — Ambulatory Visit (INDEPENDENT_AMBULATORY_CARE_PROVIDER_SITE_OTHER): Payer: Medicare Other

## 2012-12-31 DIAGNOSIS — I4891 Unspecified atrial fibrillation: Secondary | ICD-10-CM | POA: Diagnosis not present

## 2012-12-31 DIAGNOSIS — Z7901 Long term (current) use of anticoagulants: Secondary | ICD-10-CM | POA: Diagnosis not present

## 2013-01-15 ENCOUNTER — Other Ambulatory Visit: Payer: Self-pay | Admitting: Cardiovascular Disease

## 2013-01-18 DIAGNOSIS — D235 Other benign neoplasm of skin of trunk: Secondary | ICD-10-CM | POA: Diagnosis not present

## 2013-01-18 DIAGNOSIS — D1801 Hemangioma of skin and subcutaneous tissue: Secondary | ICD-10-CM | POA: Diagnosis not present

## 2013-01-18 DIAGNOSIS — L723 Sebaceous cyst: Secondary | ICD-10-CM | POA: Diagnosis not present

## 2013-01-23 DIAGNOSIS — Z23 Encounter for immunization: Secondary | ICD-10-CM | POA: Diagnosis not present

## 2013-02-11 ENCOUNTER — Ambulatory Visit (INDEPENDENT_AMBULATORY_CARE_PROVIDER_SITE_OTHER): Payer: Medicare Other | Admitting: General Practice

## 2013-02-11 DIAGNOSIS — Z7901 Long term (current) use of anticoagulants: Secondary | ICD-10-CM

## 2013-02-11 DIAGNOSIS — I4891 Unspecified atrial fibrillation: Secondary | ICD-10-CM

## 2013-02-11 LAB — POCT INR: INR: 2.6

## 2013-02-21 ENCOUNTER — Other Ambulatory Visit: Payer: Self-pay

## 2013-03-25 ENCOUNTER — Encounter: Payer: Self-pay | Admitting: Cardiovascular Disease

## 2013-03-25 ENCOUNTER — Ambulatory Visit (INDEPENDENT_AMBULATORY_CARE_PROVIDER_SITE_OTHER): Payer: Medicare Other | Admitting: *Deleted

## 2013-03-25 DIAGNOSIS — I4891 Unspecified atrial fibrillation: Secondary | ICD-10-CM

## 2013-03-25 DIAGNOSIS — Z7901 Long term (current) use of anticoagulants: Secondary | ICD-10-CM

## 2013-03-25 LAB — POCT INR: INR: 2.9

## 2013-05-06 ENCOUNTER — Ambulatory Visit (INDEPENDENT_AMBULATORY_CARE_PROVIDER_SITE_OTHER): Payer: Medicare Other | Admitting: Pharmacist

## 2013-05-06 DIAGNOSIS — I4891 Unspecified atrial fibrillation: Secondary | ICD-10-CM | POA: Diagnosis not present

## 2013-05-06 DIAGNOSIS — Z7901 Long term (current) use of anticoagulants: Secondary | ICD-10-CM | POA: Diagnosis not present

## 2013-05-06 LAB — POCT INR: INR: 1.7

## 2013-05-28 ENCOUNTER — Other Ambulatory Visit: Payer: Self-pay | Admitting: Internal Medicine

## 2013-05-28 ENCOUNTER — Encounter: Payer: Self-pay | Admitting: Cardiovascular Disease

## 2013-05-28 DIAGNOSIS — I10 Essential (primary) hypertension: Secondary | ICD-10-CM | POA: Diagnosis not present

## 2013-05-28 DIAGNOSIS — R911 Solitary pulmonary nodule: Secondary | ICD-10-CM

## 2013-05-28 DIAGNOSIS — Z1331 Encounter for screening for depression: Secondary | ICD-10-CM | POA: Diagnosis not present

## 2013-05-28 DIAGNOSIS — F17200 Nicotine dependence, unspecified, uncomplicated: Secondary | ICD-10-CM | POA: Diagnosis not present

## 2013-05-28 DIAGNOSIS — E785 Hyperlipidemia, unspecified: Secondary | ICD-10-CM | POA: Diagnosis not present

## 2013-05-31 ENCOUNTER — Ambulatory Visit
Admission: RE | Admit: 2013-05-31 | Discharge: 2013-05-31 | Disposition: A | Discharge status: Home / Self Care | Payer: Medicare Other | Source: Ambulatory Visit | Attending: Internal Medicine | Admitting: Internal Medicine

## 2013-05-31 ENCOUNTER — Encounter: Payer: Self-pay | Admitting: Cardiovascular Disease

## 2013-05-31 DIAGNOSIS — R911 Solitary pulmonary nodule: Secondary | ICD-10-CM

## 2013-06-05 ENCOUNTER — Ambulatory Visit (INDEPENDENT_AMBULATORY_CARE_PROVIDER_SITE_OTHER): Payer: Medicare Other | Admitting: *Deleted

## 2013-06-05 DIAGNOSIS — Z7901 Long term (current) use of anticoagulants: Secondary | ICD-10-CM | POA: Diagnosis not present

## 2013-06-05 DIAGNOSIS — I4891 Unspecified atrial fibrillation: Secondary | ICD-10-CM | POA: Diagnosis not present

## 2013-06-05 LAB — POCT INR: INR: 2.7

## 2013-07-17 ENCOUNTER — Ambulatory Visit (INDEPENDENT_AMBULATORY_CARE_PROVIDER_SITE_OTHER): Payer: Medicare Other | Admitting: *Deleted

## 2013-07-17 DIAGNOSIS — Z7901 Long term (current) use of anticoagulants: Secondary | ICD-10-CM

## 2013-07-17 DIAGNOSIS — I4891 Unspecified atrial fibrillation: Secondary | ICD-10-CM | POA: Diagnosis not present

## 2013-07-17 LAB — POCT INR: INR: 2.6

## 2013-07-17 MED ORDER — WARFARIN SODIUM 5 MG PO TABS: 5.0000 mg | ORAL_TABLET | ORAL | Status: DC

## 2013-07-19 ENCOUNTER — Encounter: Payer: Self-pay | Admitting: Cardiovascular Disease

## 2013-07-19 ENCOUNTER — Ambulatory Visit (INDEPENDENT_AMBULATORY_CARE_PROVIDER_SITE_OTHER): Payer: Medicare Other | Admitting: Cardiovascular Disease

## 2013-07-19 VITALS — BP 148/84 | HR 53 | Ht 74.0 in | Wt 284.0 lb

## 2013-07-19 DIAGNOSIS — I10 Essential (primary) hypertension: Secondary | ICD-10-CM

## 2013-07-19 DIAGNOSIS — E785 Hyperlipidemia, unspecified: Secondary | ICD-10-CM | POA: Diagnosis not present

## 2013-07-19 DIAGNOSIS — I4891 Unspecified atrial fibrillation: Secondary | ICD-10-CM

## 2013-07-19 NOTE — Patient Instructions (Signed)
Your physician wants you to follow-up in: 1 YEAR.  You will receive a reminder letter in the mail two months in advance. If you don't receive a letter, please call our office to schedule the follow-up appointment.  Your physician recommends that you continue on your current medications as directed. Please refer to the Current Medication list given to you today.  

## 2013-07-19 NOTE — Assessment & Plan Note (Addendum)
John Buckley  is doing well. His episodes of chest pain or shortness breath. Stable atrial fibrillation.  Unfortunately, he continues to smoke. I've advised him to stop smoking. I will see him again in one year for followup visit.

## 2013-07-19 NOTE — Progress Notes (Signed)
John Buckley Date of Birth  1945-08-04 [place] [address]    [place]   [place], [place]  [address]    [place], [place]  [address] [phone]  Fax  [phone]  [phone]  Fax [phone]  Problems: 1. Atrial fibrillation 2. Obesity 3. Hyperlipidemia 4. Chronic ankle relation 5. Hypertension History of Present Illness:  John Buckley is a 68 year old gentleman with history of chronic atrial fibrillation, obesity, hyperlipidemia, chronic anticoagulation, hypertension, and ongoing cigarette smoking. He's done very well since I last saw him. Unfortunately he still smokes about a pack of cigarettes a day. He's not had any particular problems.  During his last visit we increased his lisinopril to 20 mg a day which resulted in a well controlled blood pressure. Unfortunately he  reduced his dose back down to 10 mg a day because he thought he didn't need the extra dose.  He is feeling quite well. He denies any episodes of chest pain or shortness of breath.  July 18, 2012:  John Buckley is doing well.  No chest pain, no dyspnea.  Getting some exercise.  July 19, 2013:  Feeling well.  Exercising some.  Still smoking - 1/2 ppd.    Current Outpatient Prescriptions on File Prior to Visit  Medication Sig Dispense Refill  . amLODipine (NORVASC) 5 MG tablet Take 5 mg by mouth daily.      Marland Kitchen aspirin 81 MG tablet Take 81 mg by mouth daily.        Marland Kitchen atorvastatin (LIPITOR) 40 MG tablet Take 1 tablet (40 mg total) by mouth daily.  90 tablet  3  . Cholecalciferol (VITAMIN D3) 2000 UNITS capsule Take 4,000 Units by mouth daily.        . digoxin (LANOXIN) 0.25 MG tablet Take 1 tablet (250 mcg total) by mouth daily.  90 tablet  3  . fish oil-omega-3 fatty acids 1000 MG capsule Take 2 g by mouth daily.      Marland Kitchen losartan (COZAAR) 100 MG tablet Take 100 mg by mouth daily.      . metoprolol (TOPROL-XL) 200 MG 24 hr tablet Take 1 tablet (200 mg total) by  mouth daily.  90 tablet  3  . multivitamin (THERAGRAN) per tablet Take 1 tablet by mouth daily.        Marland Kitchen warfarin (COUMADIN) 5 MG tablet Take 1 tablet (5 mg total) by mouth as directed.  90 tablet  3   No current facility-administered medications on file prior to visit.    Allergies  Allergen Reactions  . Lisinopril Cough    Past Medical History  Diagnosis Date  . Hyperlipidemia   . Hypertension   . Atrial fibrillation   . Long-term (current) use of anticoagulants   . Tobacco abuse     Past Surgical History  Procedure Laterality Date  . Cardiac catheterization  01/15/2003    Est. EF is around 65% --  Relatively smooth coronary arteries.  He has a few minor luminal irregularities -- Normal left ventricular systolic function -- Thayer Headings, M.D.  . Colonoscopy w/ polypectomy  12/02/2003    A moderate-sized rectal polyp at 15 cm -- ohn C. Amedeo Plenty, M.D.  . Cardioversion  08/04/20006    was successful  . Tonsillectomy    . Nose surgery      History  Smoking status  . Current Every Day Smoker -- 0.50 packs/day for 39 years  .  Types: Cigarettes  Smokeless tobacco  . Not on file    History  Alcohol Use No    Family History  Problem Relation Age of Onset  . Alzheimer's disease Mother   . Colon cancer      family history of     Reviw of Systems:  Reviewed in the HPI.  All other systems are negative.  Physical Exam: BP 148/84  Pulse 53  Ht 6\' 2"  (1.88 m)  Wt 284 lb (128.822 kg)  BMI 36.45 kg/m2 The patient is alert and oriented x 3.  The mood and affect are normal.   Skin: warm and dry.  Color is normal.    HEENT:   His mucous membranes are moist. His neck is supple. No JVD. Normal carotids.  Lungs: Lungs are clear.   Heart: Irregularly irregular.    Abdomen: He is mildly obese. He is good bowel sounds. There is no hepatosplenomegaly.  Extremities:  No clubbing cyanosis or edema   Neuro:  There exam is nonfocal    ECG: 07/19/2013: Atrial  fibrillation with controlled ventricular response at 53. His nonspecific ST and T wave changes.  Assessment / Plan:

## 2013-08-08 DIAGNOSIS — J069 Acute upper respiratory infection, unspecified: Secondary | ICD-10-CM | POA: Diagnosis not present

## 2013-08-28 ENCOUNTER — Ambulatory Visit (INDEPENDENT_AMBULATORY_CARE_PROVIDER_SITE_OTHER): Payer: Medicare Other | Admitting: *Deleted

## 2013-08-28 DIAGNOSIS — Z7901 Long term (current) use of anticoagulants: Secondary | ICD-10-CM

## 2013-08-28 DIAGNOSIS — I4891 Unspecified atrial fibrillation: Secondary | ICD-10-CM

## 2013-08-28 LAB — POCT INR: INR: 2.8

## 2013-10-09 ENCOUNTER — Ambulatory Visit (INDEPENDENT_AMBULATORY_CARE_PROVIDER_SITE_OTHER): Payer: Medicare Other | Admitting: Pharmacist

## 2013-10-09 DIAGNOSIS — Z7901 Long term (current) use of anticoagulants: Secondary | ICD-10-CM

## 2013-10-09 DIAGNOSIS — I4891 Unspecified atrial fibrillation: Secondary | ICD-10-CM | POA: Diagnosis not present

## 2013-10-09 LAB — POCT INR: INR: 2.4

## 2013-11-20 ENCOUNTER — Ambulatory Visit (INDEPENDENT_AMBULATORY_CARE_PROVIDER_SITE_OTHER): Payer: Medicare Other | Admitting: Pharmacist

## 2013-11-20 DIAGNOSIS — I4891 Unspecified atrial fibrillation: Secondary | ICD-10-CM | POA: Diagnosis not present

## 2013-11-20 DIAGNOSIS — Z7901 Long term (current) use of anticoagulants: Secondary | ICD-10-CM

## 2013-11-20 LAB — POCT INR: INR: 3.2

## 2013-11-27 ENCOUNTER — Telehealth: Payer: Self-pay | Admitting: Cardiovascular Disease

## 2013-11-27 NOTE — Telephone Encounter (Signed)
Received request from Nurse fax box, documents faxed for surgical clearance. To: [facility] Fax number: [phone] Attention: 8.12.15/km

## 2013-12-03 DIAGNOSIS — E669 Obesity, unspecified: Secondary | ICD-10-CM | POA: Diagnosis not present

## 2013-12-03 DIAGNOSIS — Z1331 Encounter for screening for depression: Secondary | ICD-10-CM | POA: Diagnosis not present

## 2013-12-03 DIAGNOSIS — I10 Essential (primary) hypertension: Secondary | ICD-10-CM | POA: Diagnosis not present

## 2013-12-03 DIAGNOSIS — Z0000 Encounter for general adult medical examination without abnormal findings: Secondary | ICD-10-CM | POA: Diagnosis not present

## 2013-12-03 DIAGNOSIS — Z23 Encounter for immunization: Secondary | ICD-10-CM | POA: Diagnosis not present

## 2013-12-03 DIAGNOSIS — I4891 Unspecified atrial fibrillation: Secondary | ICD-10-CM | POA: Diagnosis not present

## 2013-12-03 DIAGNOSIS — F17200 Nicotine dependence, unspecified, uncomplicated: Secondary | ICD-10-CM | POA: Diagnosis not present

## 2013-12-03 DIAGNOSIS — Z6837 Body mass index (BMI) 37.0-37.9, adult: Secondary | ICD-10-CM | POA: Diagnosis not present

## 2013-12-12 ENCOUNTER — Other Ambulatory Visit: Payer: Self-pay | Admitting: Gastroenterology

## 2013-12-12 DIAGNOSIS — Z09 Encounter for follow-up examination after completed treatment for conditions other than malignant neoplasm: Secondary | ICD-10-CM | POA: Diagnosis not present

## 2013-12-12 DIAGNOSIS — Z86010 Personal history of colonic polyps: Secondary | ICD-10-CM | POA: Diagnosis not present

## 2013-12-12 DIAGNOSIS — D128 Benign neoplasm of rectum: Secondary | ICD-10-CM | POA: Diagnosis not present

## 2013-12-12 DIAGNOSIS — K5289 Other specified noninfective gastroenteritis and colitis: Secondary | ICD-10-CM | POA: Diagnosis not present

## 2013-12-12 DIAGNOSIS — K6389 Other specified diseases of intestine: Secondary | ICD-10-CM | POA: Diagnosis not present

## 2013-12-12 DIAGNOSIS — K5730 Diverticulosis of large intestine without perforation or abscess without bleeding: Secondary | ICD-10-CM | POA: Diagnosis not present

## 2013-12-12 DIAGNOSIS — D126 Benign neoplasm of colon, unspecified: Secondary | ICD-10-CM | POA: Diagnosis not present

## 2013-12-12 DIAGNOSIS — D129 Benign neoplasm of anus and anal canal: Secondary | ICD-10-CM | POA: Diagnosis not present

## 2014-01-01 ENCOUNTER — Ambulatory Visit (INDEPENDENT_AMBULATORY_CARE_PROVIDER_SITE_OTHER): Payer: Medicare Other | Admitting: *Deleted

## 2014-01-01 DIAGNOSIS — I4891 Unspecified atrial fibrillation: Secondary | ICD-10-CM

## 2014-01-01 DIAGNOSIS — Z7901 Long term (current) use of anticoagulants: Secondary | ICD-10-CM | POA: Diagnosis not present

## 2014-01-01 LAB — POCT INR: INR: 2.2

## 2014-01-08 DIAGNOSIS — Z23 Encounter for immunization: Secondary | ICD-10-CM | POA: Diagnosis not present

## 2014-01-28 DIAGNOSIS — L219 Seborrheic dermatitis, unspecified: Secondary | ICD-10-CM | POA: Diagnosis not present

## 2014-01-28 DIAGNOSIS — Z08 Encounter for follow-up examination after completed treatment for malignant neoplasm: Secondary | ICD-10-CM | POA: Diagnosis not present

## 2014-01-28 DIAGNOSIS — Z85828 Personal history of other malignant neoplasm of skin: Secondary | ICD-10-CM | POA: Diagnosis not present

## 2014-01-28 DIAGNOSIS — B079 Viral wart, unspecified: Secondary | ICD-10-CM | POA: Diagnosis not present

## 2014-01-31 ENCOUNTER — Other Ambulatory Visit: Payer: Self-pay

## 2014-02-12 ENCOUNTER — Ambulatory Visit (INDEPENDENT_AMBULATORY_CARE_PROVIDER_SITE_OTHER): Payer: Medicare Other | Admitting: *Deleted

## 2014-02-12 DIAGNOSIS — I4891 Unspecified atrial fibrillation: Secondary | ICD-10-CM | POA: Diagnosis not present

## 2014-02-12 DIAGNOSIS — Z7901 Long term (current) use of anticoagulants: Secondary | ICD-10-CM | POA: Diagnosis not present

## 2014-02-12 LAB — POCT INR: INR: 2.2

## 2014-03-26 ENCOUNTER — Ambulatory Visit (INDEPENDENT_AMBULATORY_CARE_PROVIDER_SITE_OTHER): Payer: Medicare Other | Admitting: *Deleted

## 2014-03-26 DIAGNOSIS — I4891 Unspecified atrial fibrillation: Secondary | ICD-10-CM | POA: Diagnosis not present

## 2014-03-26 DIAGNOSIS — Z7901 Long term (current) use of anticoagulants: Secondary | ICD-10-CM | POA: Diagnosis not present

## 2014-03-26 LAB — POCT INR: INR: 2.3

## 2014-03-28 ENCOUNTER — Other Ambulatory Visit: Payer: Self-pay | Admitting: *Deleted

## 2014-03-28 MED ORDER — WARFARIN SODIUM 5 MG PO TABS: 5.0000 mg | ORAL_TABLET | ORAL | Status: DC

## 2014-04-24 DIAGNOSIS — L02612 Cutaneous abscess of left foot: Secondary | ICD-10-CM | POA: Diagnosis not present

## 2014-04-24 DIAGNOSIS — L03032 Cellulitis of left toe: Secondary | ICD-10-CM | POA: Diagnosis not present

## 2014-04-30 DIAGNOSIS — H9202 Otalgia, left ear: Secondary | ICD-10-CM | POA: Diagnosis not present

## 2014-05-07 ENCOUNTER — Ambulatory Visit (INDEPENDENT_AMBULATORY_CARE_PROVIDER_SITE_OTHER): Payer: Medicare Other | Admitting: *Deleted

## 2014-05-07 DIAGNOSIS — I4891 Unspecified atrial fibrillation: Secondary | ICD-10-CM

## 2014-05-07 DIAGNOSIS — Z7901 Long term (current) use of anticoagulants: Secondary | ICD-10-CM

## 2014-05-07 LAB — POCT INR: INR: 2.7

## 2014-05-29 ENCOUNTER — Other Ambulatory Visit: Payer: Self-pay | Admitting: Internal Medicine

## 2014-05-29 DIAGNOSIS — R911 Solitary pulmonary nodule: Secondary | ICD-10-CM

## 2014-05-29 DIAGNOSIS — F17210 Nicotine dependence, cigarettes, uncomplicated: Secondary | ICD-10-CM | POA: Diagnosis not present

## 2014-05-29 DIAGNOSIS — I482 Chronic atrial fibrillation: Secondary | ICD-10-CM | POA: Diagnosis not present

## 2014-05-29 DIAGNOSIS — Z125 Encounter for screening for malignant neoplasm of prostate: Secondary | ICD-10-CM | POA: Diagnosis not present

## 2014-05-29 DIAGNOSIS — I10 Essential (primary) hypertension: Secondary | ICD-10-CM | POA: Diagnosis not present

## 2014-05-29 DIAGNOSIS — E780 Pure hypercholesterolemia: Secondary | ICD-10-CM | POA: Diagnosis not present

## 2014-06-03 ENCOUNTER — Ambulatory Visit
Admission: RE | Admit: 2014-06-03 | Discharge: 2014-06-03 | Disposition: A | Discharge status: Home / Self Care | Payer: Medicare Other | Source: Ambulatory Visit | Attending: Internal Medicine | Admitting: Internal Medicine

## 2014-06-03 DIAGNOSIS — R911 Solitary pulmonary nodule: Secondary | ICD-10-CM | POA: Diagnosis not present

## 2014-06-18 ENCOUNTER — Ambulatory Visit (INDEPENDENT_AMBULATORY_CARE_PROVIDER_SITE_OTHER): Payer: Medicare Other | Admitting: *Deleted

## 2014-06-18 DIAGNOSIS — I4891 Unspecified atrial fibrillation: Secondary | ICD-10-CM | POA: Diagnosis not present

## 2014-06-18 DIAGNOSIS — Z7901 Long term (current) use of anticoagulants: Secondary | ICD-10-CM | POA: Diagnosis not present

## 2014-06-18 LAB — POCT INR: INR: 2.5

## 2014-07-04 ENCOUNTER — Other Ambulatory Visit: Payer: Self-pay | Admitting: *Deleted

## 2014-07-04 MED ORDER — WARFARIN SODIUM 5 MG PO TABS: 5.0000 mg | ORAL_TABLET | ORAL | Status: DC

## 2014-07-30 ENCOUNTER — Ambulatory Visit (INDEPENDENT_AMBULATORY_CARE_PROVIDER_SITE_OTHER): Payer: Medicare Other | Admitting: *Deleted

## 2014-07-30 DIAGNOSIS — I4891 Unspecified atrial fibrillation: Secondary | ICD-10-CM | POA: Diagnosis not present

## 2014-07-30 DIAGNOSIS — Z7901 Long term (current) use of anticoagulants: Secondary | ICD-10-CM

## 2014-07-30 LAB — POCT INR: INR: 3.1

## 2014-08-08 ENCOUNTER — Ambulatory Visit (INDEPENDENT_AMBULATORY_CARE_PROVIDER_SITE_OTHER): Payer: Medicare Other | Admitting: Cardiovascular Disease

## 2014-08-08 ENCOUNTER — Encounter: Payer: Self-pay | Admitting: Cardiovascular Disease

## 2014-08-08 VITALS — BP 140/70 | HR 56 | Ht 74.0 in | Wt 286.4 lb

## 2014-08-08 DIAGNOSIS — I10 Essential (primary) hypertension: Secondary | ICD-10-CM

## 2014-08-08 DIAGNOSIS — I4891 Unspecified atrial fibrillation: Secondary | ICD-10-CM

## 2014-08-08 MED ORDER — VALSARTAN 160 MG PO TABS: 160.0000 mg | ORAL_TABLET | Freq: Every day | ORAL | Status: DC

## 2014-08-08 MED ORDER — METOPROLOL SUCCINATE ER 100 MG PO TB24: 100.0000 mg | ORAL_TABLET | Freq: Every day | ORAL | Status: DC

## 2014-08-08 NOTE — Patient Instructions (Signed)
Medication Instructions:  Your physician has recommended you make the following change in your medication:  1. STOP Digoxin 2. STOP Losartan 3. DECREASE Metoprolol Succinate to 100mg  take one by mouth daily 4. START Valsartan (Diovan) 160mg  take one by mouth daily  Labwork: Your physician recommends that you return for lab work in: 1 MONTH (BMP)  Testing/Procedures: No new orders.  Follow-Up: Your physician recommends that you schedule a follow-up appointment in: 3 MONTHS with Dr Acie Fredrickson  Any Other Special Instructions Will Be Listed Below (If Applicable).

## 2014-08-08 NOTE — Progress Notes (Signed)
Cardiology Office Note   Date:  08/08/2014   ID:  John Buckley, DOB 1946/04/15, MRN [id number]  PCP:  Irven Shelling, MD  Cardiologist:   Thayer Headings, MD   Chief Complaint  Patient presents with  . Atrial Fibrillation   . Atrial fibrillation 2. Obesity 3. Hyperlipidemia 4. Chronic ankle relation 5. Hypertension History of Present Illness:  John Buckley is a 69 year old gentleman with history of chronic atrial fibrillation, obesity, hyperlipidemia, chronic anticoagulation, hypertension, and ongoing cigarette smoking. He's done very well since I last saw him. Unfortunately he still smokes about a pack of cigarettes a day. He's not had any particular problems.  During his last visit we increased his lisinopril to 20 mg a day which resulted in a well controlled blood pressure. Unfortunately he reduced his dose back down to 10 mg a day because he thought he didn't need the extra dose. He is feeling quite well. He denies any episodes of chest pain or shortness of breath.  July 18, 2012:  John Buckley is doing well. No chest pain, no dyspnea. Getting some exercise.  July 19, 2013:  Feeling well. Exercising some. Still smoking - 1/2 ppd.    August 08, 2014:  John Buckley is a 69 y.o. male who presents for follow-up of his atrial fibrillation. No CP , no dyspnea.  Getting some exercise.     Past Medical History  Diagnosis Date  . Hyperlipidemia   . Hypertension   . Atrial fibrillation   . Long-term (current) use of anticoagulants   . Tobacco abuse     Past Surgical History  Procedure Laterality Date  . Cardiac catheterization  01/15/2003    Est. EF is around 65% --  Relatively smooth coronary arteries.  He has a few minor luminal irregularities -- Normal left ventricular systolic function -- Thayer Headings, M.D.  . Colonoscopy w/ polypectomy  12/02/2003    A moderate-sized rectal polyp at 15 cm -- ohn C. Amedeo Plenty, M.D.  . Cardioversion  08/04/20006    was  successful  . Tonsillectomy    . Nose surgery       Current Outpatient Prescriptions  Medication Sig Dispense Refill  . amLODipine (NORVASC) 5 MG tablet Take 5 mg by mouth daily.    Marland Kitchen aspirin 81 MG tablet Take 81 mg by mouth daily.      Marland Kitchen atorvastatin (LIPITOR) 40 MG tablet Take 1 tablet (40 mg total) by mouth daily. 90 tablet 3  . Cholecalciferol (VITAMIN D3) 2000 UNITS capsule Take 4,000 Units by mouth daily.      . digoxin (LANOXIN) 0.25 MG tablet Take 1 tablet (250 mcg total) by mouth daily. 90 tablet 3  . fish oil-omega-3 fatty acids 1000 MG capsule Take 2 g by mouth daily.    Marland Kitchen losartan (COZAAR) 100 MG tablet Take 100 mg by mouth daily.    . metoprolol (TOPROL-XL) 200 MG 24 hr tablet Take 1 tablet (200 mg total) by mouth daily. 90 tablet 3  . multivitamin (THERAGRAN) per tablet Take 1 tablet by mouth daily.      Marland Kitchen warfarin (COUMADIN) 5 MG tablet Take 1 tablet (5 mg total) by mouth as directed. 90 tablet 0   No current facility-administered medications for this visit.    Allergies:   Lisinopril    Social History:  The patient  reports that he has been smoking Cigarettes.  He has a 19.5 pack-year smoking history. He does not have any smokeless tobacco history on  file. He reports that he does not drink alcohol or use illicit drugs.   Family History:  The patient's family history includes Alzheimer's disease in his mother; Colon cancer in an other family member.    ROS:  Please see the history of present illness.    Review of Systems: Constitutional:  denies fever, chills, diaphoresis, appetite change and fatigue.  HEENT: denies photophobia, eye pain, redness, hearing loss, ear pain, congestion, sore throat, rhinorrhea, sneezing, neck pain, neck stiffness and tinnitus.  Respiratory: denies SOB, DOE, cough, chest tightness, and wheezing.  Cardiovascular: denies chest pain, palpitations and leg swelling.  Gastrointestinal: denies nausea, vomiting, abdominal pain, diarrhea,  constipation, blood in stool.  Genitourinary: denies dysuria, urgency, frequency, hematuria, flank pain and difficulty urinating.  Musculoskeletal: denies  myalgias, back pain, joint swelling, arthralgias and gait problem.   Skin: denies pallor, rash and wound.  Neurological: denies dizziness, seizures, syncope, weakness, light-headedness, numbness and headaches.   Hematological: denies adenopathy, easy bruising, personal or family bleeding history.  Psychiatric/ Behavioral: denies suicidal ideation, mood changes, confusion, nervousness, sleep disturbance and agitation.       All other systems are reviewed and negative.    PHYSICAL EXAM: VS:  BP 140/70 mmHg  Pulse 56  Ht 6\' 2"  (1.88 m)  Wt 286 lb 6.4 oz (129.91 kg)  BMI 36.76 kg/m2 , BMI Body mass index is 36.76 kg/(m^2). GEN: Well nourished, well developed, in no acute distress HEENT: normal Neck: no JVD, carotid bruits, or masses Cardiac: RRR; no murmurs, rubs, or gallops,no edema  Respiratory:  clear to auscultation bilaterally, normal work of breathing GI: soft, nontender, nondistended, + BS MS: no deformity or atrophy Skin: warm and dry, no rash Neuro:  Strength and sensation are intact Psych: normal   EKG:  EKG is ordered today. The ekg ordered today demonstrates atrial fib with slow ventricular response    Recent Labs: No results found for requested labs within last 365 days.    Lipid Panel    Component Value Date/Time   CHOL 157 05/04/2011 1001   TRIG 175.0* 05/04/2011 1001   HDL 46.90 05/04/2011 1001   CHOLHDL 3 05/04/2011 1001   VLDL 35.0 05/04/2011 1001   LDLCALC 75 05/04/2011 1001      Wt Readings from Last 3 Encounters:  08/08/14 286 lb 6.4 oz (129.91 kg)  07/19/13 284 lb (128.822 kg)  07/18/12 283 lb (128.368 kg)      Other studies Reviewed: Additional studies/ records that were reviewed today include: . Review of the above records demonstrates:    ASSESSMENT AND PLAN:  1.  Atrial  fibrillation - he remains in atrial fibrillation. We will discontinue the.digoxin. Continue current dose of Coumadin. His heart rate is fairly slow. We'll decrease the metoprolol XL to 100 g a day. 2. Obesity 3. Hyperlipidemia - followed by his general medical doctor. 4.  Hypertension- we will discontinue the losartan and start him on valsartan 160 mg a day.   Current medicines are reviewed at length with the patient today.  The patient does not have concerns regarding medicines.  The following changes have been made:  no change  Labs/ tests ordered today include:  No orders of the defined types were placed in this encounter.     Disposition:   FU with me in 3 months     Signed, Clive Parcel, Wonda Cheng, MD  08/08/2014 4:19 PM    [facility] [place], [place], [place]  [address] Phone: [phone]; Fax: [phone]

## 2014-09-08 ENCOUNTER — Other Ambulatory Visit (INDEPENDENT_AMBULATORY_CARE_PROVIDER_SITE_OTHER): Payer: Medicare Other | Admitting: *Deleted

## 2014-09-08 DIAGNOSIS — I4891 Unspecified atrial fibrillation: Secondary | ICD-10-CM | POA: Diagnosis not present

## 2014-09-08 DIAGNOSIS — I10 Essential (primary) hypertension: Secondary | ICD-10-CM

## 2014-09-08 LAB — BASIC METABOLIC PANEL
BUN: 13 mg/dL (ref 6–23)
CALCIUM: 9.4 mg/dL (ref 8.4–10.5)
CO2: 27 meq/L (ref 19–32)
CREATININE: 0.74 mg/dL (ref 0.40–1.50)
Chloride: 103 mEq/L (ref 96–112)
GFR: 111.22 mL/min (ref >60.00)
Glucose, Bld: 124 mg/dL — ABNORMAL HIGH (ref 70–99)
Potassium: 4.3 mEq/L (ref 3.5–5.1)
Sodium: 136 mEq/L (ref 135–145)

## 2014-09-10 ENCOUNTER — Ambulatory Visit (INDEPENDENT_AMBULATORY_CARE_PROVIDER_SITE_OTHER): Payer: Medicare Other | Admitting: *Deleted

## 2014-09-10 DIAGNOSIS — Z7901 Long term (current) use of anticoagulants: Secondary | ICD-10-CM

## 2014-09-10 DIAGNOSIS — I4891 Unspecified atrial fibrillation: Secondary | ICD-10-CM

## 2014-09-10 LAB — POCT INR: INR: 2.2

## 2014-10-13 ENCOUNTER — Other Ambulatory Visit: Payer: Self-pay

## 2014-10-22 ENCOUNTER — Ambulatory Visit (INDEPENDENT_AMBULATORY_CARE_PROVIDER_SITE_OTHER): Payer: Medicare Other | Admitting: *Deleted

## 2014-10-22 DIAGNOSIS — I4891 Unspecified atrial fibrillation: Secondary | ICD-10-CM | POA: Diagnosis not present

## 2014-10-22 DIAGNOSIS — Z7901 Long term (current) use of anticoagulants: Secondary | ICD-10-CM | POA: Diagnosis not present

## 2014-10-22 LAB — POCT INR: INR: 2

## 2014-11-05 ENCOUNTER — Encounter: Payer: Self-pay | Admitting: Cardiovascular Disease

## 2014-11-05 DIAGNOSIS — I10 Essential (primary) hypertension: Secondary | ICD-10-CM

## 2014-11-05 DIAGNOSIS — I4891 Unspecified atrial fibrillation: Secondary | ICD-10-CM

## 2014-11-05 MED ORDER — VALSARTAN 160 MG PO TABS: 160.0000 mg | ORAL_TABLET | Freq: Every day | ORAL | Status: DC

## 2014-12-01 DIAGNOSIS — I10 Essential (primary) hypertension: Secondary | ICD-10-CM | POA: Diagnosis not present

## 2014-12-01 DIAGNOSIS — Z1389 Encounter for screening for other disorder: Secondary | ICD-10-CM | POA: Diagnosis not present

## 2014-12-05 ENCOUNTER — Ambulatory Visit (INDEPENDENT_AMBULATORY_CARE_PROVIDER_SITE_OTHER): Payer: Medicare Other | Admitting: *Deleted

## 2014-12-05 ENCOUNTER — Ambulatory Visit (INDEPENDENT_AMBULATORY_CARE_PROVIDER_SITE_OTHER): Payer: Medicare Other | Admitting: Cardiovascular Disease

## 2014-12-05 ENCOUNTER — Encounter: Payer: Self-pay | Admitting: Cardiovascular Disease

## 2014-12-05 VITALS — BP 136/88 | HR 81 | Ht 74.0 in | Wt 284.8 lb

## 2014-12-05 DIAGNOSIS — I4891 Unspecified atrial fibrillation: Secondary | ICD-10-CM

## 2014-12-05 DIAGNOSIS — I482 Chronic atrial fibrillation, unspecified: Secondary | ICD-10-CM

## 2014-12-05 DIAGNOSIS — I10 Essential (primary) hypertension: Secondary | ICD-10-CM

## 2014-12-05 DIAGNOSIS — Z7901 Long term (current) use of anticoagulants: Secondary | ICD-10-CM | POA: Diagnosis not present

## 2014-12-05 DIAGNOSIS — E785 Hyperlipidemia, unspecified: Secondary | ICD-10-CM | POA: Diagnosis not present

## 2014-12-05 LAB — POCT INR: INR: 2.2

## 2014-12-05 NOTE — Progress Notes (Signed)
Cardiology Office Note   Date:  12/05/2014   ID:  John Buckley, DOB 24-Sep-1945, MRN [id number]  PCP:  Irven Shelling, MD  Cardiologist:   Thayer Headings, MD   Chief Complaint  Patient presents with  . Follow-up   . Atrial fibrillation 2. Obesity 3. Hyperlipidemia  4. Chronic ankle relation 5. Hypertension History of Present Illness:  John Buckley is a 69 year old gentleman with history of chronic atrial fibrillation, obesity, hyperlipidemia, chronic anticoagulation, hypertension, and ongoing cigarette smoking. He's done very well since I last saw him. Unfortunately he still smokes about a pack of cigarettes a day. He's not had any particular problems.  During his last visit we increased his lisinopril to 20 mg a day which resulted in a well controlled blood pressure. Unfortunately he reduced his dose back down to 10 mg a day because he thought he didn't need the extra dose. He is feeling quite well. He denies any episodes of chest pain or shortness of breath.  July 18, 2012:  [name] is doing well. No chest pain, no dyspnea. Getting some exercise.  July 19, 2013:  Feeling well. Exercising some. Still smoking - 1/2 ppd.    August 08, 2014:  John Buckley is a 69 y.o. male who presents for follow-up of his atrial fibrillation. No CP , no dyspnea.  Getting some exercise.   December 05, 2014:   John Buckley is seen today for follow-up of his atrial fibrillation. No chest pains or shortness breath. He's getting a little bit of exercise. Wants to start golfing . Joining silver sneakers   We changed him to Valsartan 160 mg during the last vist. He is tolerating this well.    Past Medical History  Diagnosis Date  . Hyperlipidemia   . Hypertension   . Atrial fibrillation   . Long-term (current) use of anticoagulants   . Tobacco abuse     Past Surgical History  Procedure Laterality Date  . Cardiac catheterization  01/15/2003    Est. EF is around 65% --  Relatively  smooth coronary arteries.  He has a few minor luminal irregularities -- Normal left ventricular systolic function -- Thayer Headings, M.D.  . Colonoscopy w/ polypectomy  12/02/2003    A moderate-sized rectal polyp at 15 cm -- ohn C. Amedeo Plenty, M.D.  . Cardioversion  08/04/20006    was successful  . Tonsillectomy    . Nose surgery       Current Outpatient Prescriptions  Medication Sig Dispense Refill  . amLODipine (NORVASC) 5 MG tablet Take 5 mg by mouth daily.    Marland Kitchen aspirin 81 MG tablet Take 81 mg by mouth daily.      Marland Kitchen atorvastatin (LIPITOR) 40 MG tablet Take 1 tablet (40 mg total) by mouth daily. 90 tablet 3  . Cholecalciferol (VITAMIN D3) 2000 UNITS capsule Take 4,000 Units by mouth daily.      . fish oil-omega-3 fatty acids 1000 MG capsule Take 2 g by mouth daily.    . metoprolol (TOPROL-XL) 100 MG 24 hr tablet Take 1 tablet (100 mg total) by mouth daily.    . multivitamin (THERAGRAN) per tablet Take 1 tablet by mouth daily.      . valsartan (DIOVAN) 160 MG tablet Take 1 tablet (160 mg total) by mouth daily. 31 tablet 0  . warfarin (COUMADIN) 5 MG tablet Take 1 tablet (5 mg total) by mouth as directed. 90 tablet 0   No current facility-administered medications for this visit.  Allergies:   Lisinopril    Social History:  The patient  reports that he has been smoking Cigarettes.  He has a 19.5 pack-year smoking history. He does not have any smokeless tobacco history on file. He reports that he does not drink alcohol or use illicit drugs.   Family History:  The patient's family history includes Alzheimer's disease in his mother; Colon cancer in an other family member.    ROS:  Please see the history of present illness.    Review of Systems: Constitutional:  denies fever, chills, diaphoresis, appetite change and fatigue.  HEENT: denies photophobia, eye pain, redness, hearing loss, ear pain, congestion, sore throat, rhinorrhea, sneezing, neck pain, neck stiffness and tinnitus.    Respiratory: denies SOB, DOE, cough, chest tightness, and wheezing.  Cardiovascular: denies chest pain, palpitations and leg swelling.  Gastrointestinal: denies nausea, vomiting, abdominal pain, diarrhea, constipation, blood in stool.  Genitourinary: denies dysuria, urgency, frequency, hematuria, flank pain and difficulty urinating.  Musculoskeletal: denies  myalgias, back pain, joint swelling, arthralgias and gait problem.   Skin: denies pallor, rash and wound.  Neurological: denies dizziness, seizures, syncope, weakness, light-headedness, numbness and headaches.   Hematological: denies adenopathy, easy bruising, personal or family bleeding history.  Psychiatric/ Behavioral: denies suicidal ideation, mood changes, confusion, nervousness, sleep disturbance and agitation.       All other systems are reviewed and negative.    PHYSICAL EXAM: VS:  BP 136/88 mmHg  Pulse 81  Ht 6\' 2"  (1.88 m)  Wt 129.184 kg (284 lb 12.8 oz)  BMI 36.55 kg/m2 , BMI Body mass index is 36.55 kg/(m^2). GEN: Well nourished, well developed, in no acute distress HEENT: normal Neck: no JVD, carotid bruits, or masses Cardiac: RRR; no murmurs, rubs, or gallops,no edema  Respiratory:  clear to auscultation bilaterally, normal work of breathing GI: soft, nontender, nondistended, + BS MS: no deformity or atrophy Skin: warm and dry, no rash Neuro:  Strength and sensation are intact Psych: normal   EKG:  EKG is ordered today. The ekg ordered today demonstrates atrial fib with slow ventricular response    Recent Labs: 09/08/2014: BUN 13; Creatinine, Ser 0.74; Potassium 4.3; Sodium 136    Lipid Panel    Component Value Date/Time   CHOL 157 05/04/2011 1001   TRIG 175.0* 05/04/2011 1001   HDL 46.90 05/04/2011 1001   CHOLHDL 3 05/04/2011 1001   VLDL 35.0 05/04/2011 1001   LDLCALC 75 05/04/2011 1001      Wt Readings from Last 3 Encounters:  12/05/14 129.184 kg (284 lb 12.8 oz)  08/08/14 129.91 kg (286 lb  6.4 oz)  07/19/13 128.822 kg (284 lb)      Other studies Reviewed: Additional studies/ records that were reviewed today include: . Review of the above records demonstrates:    ASSESSMENT AND PLAN:  1.  Atrial fibrillation - he remains in atrial fibrillation.  Continue current dose of Coumadin. His heart rate is fairly slow. We'll decrease the metoprolol XL to 100 g a day. 2. Obesity 3. Hyperlipidemia - followed by his general medical doctor. 4.  Hypertension-   He is tolerating the Valsartan quite well .   Current medicines are reviewed at length with the patient today.  The patient does not have concerns regarding medicines.  The following changes have been made:  no change  Labs/ tests ordered today include:  No orders of the defined types were placed in this encounter.     Disposition:   FU with me  in 6 months     Signed, Nohemy Koop, Wonda Cheng, MD  12/05/2014 10:57 AM    [facility] [place], [place], [place]  [address] Phone: [phone]; Fax: [phone]

## 2014-12-05 NOTE — Patient Instructions (Signed)
Medication Instructions:  STOP Aspirin   Labwork: None Ordered   Testing/Procedures: None Ordered   Follow-Up: Your physician wants you to follow-up in: 6 months with Dr. Acie Fredrickson.  You will receive a reminder letter in the mail two months in advance. If you don't receive a letter, please call our office to schedule the follow-up appointment.

## 2014-12-08 ENCOUNTER — Encounter: Payer: Self-pay | Admitting: Cardiovascular Disease

## 2014-12-08 DIAGNOSIS — I4891 Unspecified atrial fibrillation: Secondary | ICD-10-CM

## 2014-12-08 DIAGNOSIS — I10 Essential (primary) hypertension: Secondary | ICD-10-CM

## 2014-12-08 MED ORDER — VALSARTAN 160 MG PO TABS
160.0000 mg | ORAL_TABLET | Freq: Every day | ORAL | Status: DC
Start: 2014-12-08 — End: 2016-12-15

## 2015-01-08 DIAGNOSIS — Z23 Encounter for immunization: Secondary | ICD-10-CM | POA: Diagnosis not present

## 2015-01-16 ENCOUNTER — Ambulatory Visit (INDEPENDENT_AMBULATORY_CARE_PROVIDER_SITE_OTHER): Payer: Medicare Other | Admitting: Pharmacist

## 2015-01-16 DIAGNOSIS — I4891 Unspecified atrial fibrillation: Secondary | ICD-10-CM

## 2015-01-16 DIAGNOSIS — Z7901 Long term (current) use of anticoagulants: Secondary | ICD-10-CM | POA: Diagnosis not present

## 2015-01-16 LAB — POCT INR: INR: 2.2

## 2015-02-03 DIAGNOSIS — L821 Other seborrheic keratosis: Secondary | ICD-10-CM | POA: Diagnosis not present

## 2015-02-03 DIAGNOSIS — D225 Melanocytic nevi of trunk: Secondary | ICD-10-CM | POA: Diagnosis not present

## 2015-02-03 DIAGNOSIS — Z85828 Personal history of other malignant neoplasm of skin: Secondary | ICD-10-CM | POA: Diagnosis not present

## 2015-02-03 DIAGNOSIS — L570 Actinic keratosis: Secondary | ICD-10-CM | POA: Diagnosis not present

## 2015-02-03 DIAGNOSIS — Z08 Encounter for follow-up examination after completed treatment for malignant neoplasm: Secondary | ICD-10-CM | POA: Diagnosis not present

## 2015-02-27 ENCOUNTER — Ambulatory Visit (INDEPENDENT_AMBULATORY_CARE_PROVIDER_SITE_OTHER): Payer: Medicare Other | Admitting: *Deleted

## 2015-02-27 DIAGNOSIS — Z7901 Long term (current) use of anticoagulants: Secondary | ICD-10-CM | POA: Diagnosis not present

## 2015-02-27 DIAGNOSIS — I4891 Unspecified atrial fibrillation: Secondary | ICD-10-CM

## 2015-02-27 LAB — POCT INR: INR: 1.7

## 2015-03-18 ENCOUNTER — Ambulatory Visit (INDEPENDENT_AMBULATORY_CARE_PROVIDER_SITE_OTHER): Payer: Medicare Other | Admitting: *Deleted

## 2015-03-18 DIAGNOSIS — Z7901 Long term (current) use of anticoagulants: Secondary | ICD-10-CM | POA: Diagnosis not present

## 2015-03-18 DIAGNOSIS — I4891 Unspecified atrial fibrillation: Secondary | ICD-10-CM | POA: Diagnosis not present

## 2015-03-18 LAB — POCT INR: INR: 2.4

## 2015-03-25 DIAGNOSIS — T148 Other injury of unspecified body region: Secondary | ICD-10-CM | POA: Diagnosis not present

## 2015-04-02 ENCOUNTER — Other Ambulatory Visit: Payer: Self-pay | Admitting: Cardiovascular Disease

## 2015-04-08 ENCOUNTER — Ambulatory Visit (INDEPENDENT_AMBULATORY_CARE_PROVIDER_SITE_OTHER): Payer: Medicare Other | Admitting: *Deleted

## 2015-04-08 DIAGNOSIS — I4891 Unspecified atrial fibrillation: Secondary | ICD-10-CM | POA: Diagnosis not present

## 2015-04-08 DIAGNOSIS — Z7901 Long term (current) use of anticoagulants: Secondary | ICD-10-CM

## 2015-04-08 LAB — POCT INR: INR: 2.5

## 2015-05-06 ENCOUNTER — Ambulatory Visit (INDEPENDENT_AMBULATORY_CARE_PROVIDER_SITE_OTHER): Payer: Medicare Other | Admitting: *Deleted

## 2015-05-06 DIAGNOSIS — I4891 Unspecified atrial fibrillation: Secondary | ICD-10-CM

## 2015-05-06 DIAGNOSIS — Z7901 Long term (current) use of anticoagulants: Secondary | ICD-10-CM | POA: Diagnosis not present

## 2015-05-06 LAB — POCT INR: INR: 2.6

## 2015-06-04 ENCOUNTER — Ambulatory Visit (INDEPENDENT_AMBULATORY_CARE_PROVIDER_SITE_OTHER): Payer: Medicare Other | Admitting: Cardiovascular Disease

## 2015-06-04 ENCOUNTER — Ambulatory Visit: Payer: Medicare Other | Admitting: Cardiovascular Disease

## 2015-06-04 ENCOUNTER — Encounter: Payer: Self-pay | Admitting: Cardiovascular Disease

## 2015-06-04 VITALS — BP 142/94 | HR 68 | Ht 74.0 in | Wt 295.0 lb

## 2015-06-04 DIAGNOSIS — I10 Essential (primary) hypertension: Secondary | ICD-10-CM | POA: Diagnosis not present

## 2015-06-04 DIAGNOSIS — I482 Chronic atrial fibrillation, unspecified: Secondary | ICD-10-CM

## 2015-06-04 DIAGNOSIS — L989 Disorder of the skin and subcutaneous tissue, unspecified: Secondary | ICD-10-CM | POA: Diagnosis not present

## 2015-06-04 DIAGNOSIS — E7800 Pure hypercholesterolemia, unspecified: Secondary | ICD-10-CM | POA: Diagnosis not present

## 2015-06-04 NOTE — Patient Instructions (Signed)

## 2015-06-04 NOTE — Progress Notes (Signed)
Cardiology Office Note   Date:  06/04/2015   ID:  John Buckley, DOB 1946/03/22, MRN [id number]  PCP:  Irven Shelling, MD  Cardiologist:   Thayer Headings, MD   Chief Complaint  Patient presents with  . Follow-up    atrial fib   . Atrial fibrillation 2. Obesity 3. Hyperlipidemia  4. Chronic ankle relation 5. Hypertension History of Present Illness:  John Buckley is a 70 year old gentleman with history of chronic atrial fibrillation, obesity, hyperlipidemia, chronic anticoagulation, hypertension, and ongoing cigarette smoking. He's done very well since I last saw him. Unfortunately he still smokes about a pack of cigarettes a day. He's not had any particular problems.  During his last visit we increased his lisinopril to 20 mg a day which resulted in a well controlled blood pressure. Unfortunately he reduced his dose back down to 10 mg a day because he thought he didn't need the extra dose. He is feeling quite well. He denies any episodes of chest pain or shortness of breath.  July 18, 2012:  John Buckley is doing well. No chest pain, no dyspnea. Getting some exercise.  July 19, 2013:  Feeling well. Exercising some. Still smoking - 1/2 ppd.    August 08, 2014:  John Buckley is a 70 y.o. male who presents for follow-up of his atrial fibrillation. No CP , no dyspnea.  Getting some exercise.   December 05, 2014:   John Buckley is seen today for follow-up of his atrial fibrillation. No chest pains or shortness breath. He's getting a little bit of exercise. Wants to start golfing . Joining silver sneakers   We changed him to Valsartan 160 mg during the last vist. He is tolerating this well.   Feb. 16, 2017:  No cardiac issues.  Had a mass in his right groin  - looked like a bruise,   Popped and has drained. Dr. Laurann Montana has evaluated this  - may have been a cyst   Past Medical History  Diagnosis Date  . Hyperlipidemia   . Hypertension   . Atrial fibrillation ([place])   .  Long-term (current) use of anticoagulants   . Tobacco abuse     Past Surgical History  Procedure Laterality Date  . Cardiac catheterization  01/15/2003    Est. EF is around 65% --  Relatively smooth coronary arteries.  He has a few minor luminal irregularities -- Normal left ventricular systolic function -- Thayer Headings, M.D.  . Colonoscopy w/ polypectomy  12/02/2003    A moderate-sized rectal polyp at 15 cm -- ohn C. Amedeo Plenty, M.D.  . Cardioversion  08/04/20006    was successful  . Tonsillectomy    . Nose surgery       Current Outpatient Prescriptions  Medication Sig Dispense Refill  . amLODipine (NORVASC) 5 MG tablet Take 5 mg by mouth daily.    Marland Kitchen atorvastatin (LIPITOR) 40 MG tablet Take 1 tablet (40 mg total) by mouth daily. 90 tablet 3  . Cholecalciferol (VITAMIN D3) 2000 UNITS capsule Take 4,000 Units by mouth daily.      . fish oil-omega-3 fatty acids 1000 MG capsule Take 2 g by mouth daily.    Marland Kitchen JANTOVEN 5 MG tablet TAKE 1 TABLET AS DIRECTED 90 tablet 1  . metoprolol (TOPROL-XL) 100 MG 24 hr tablet Take 1 tablet (100 mg total) by mouth daily.    . multivitamin (THERAGRAN) per tablet Take 1 tablet by mouth daily.      . valsartan (DIOVAN) 160  MG tablet Take 1 tablet (160 mg total) by mouth daily. 90 tablet 3  . warfarin (COUMADIN) 5 MG tablet Take 1 tablet (5 mg total) by mouth as directed. 90 tablet 0   No current facility-administered medications for this visit.    Allergies:   Lisinopril    Social History:  The patient  reports that he has been smoking Cigarettes.  He has a 19.5 pack-year smoking history. He does not have any smokeless tobacco history on file. He reports that he does not drink alcohol or use illicit drugs.   Family History:  The patient's family history includes Alzheimer's disease in his mother.    ROS:  Please see the history of present illness.    Review of Systems: Constitutional:  denies fever, chills, diaphoresis, appetite change and  fatigue.  HEENT: denies photophobia, eye pain, redness, hearing loss, ear pain, congestion, sore throat, rhinorrhea, sneezing, neck pain, neck stiffness and tinnitus.  Respiratory: denies SOB, DOE, cough, chest tightness, and wheezing.  Cardiovascular: denies chest pain, palpitations and leg swelling.  Gastrointestinal: denies nausea, vomiting, abdominal pain, diarrhea, constipation, blood in stool.  Genitourinary: denies dysuria, urgency, frequency, hematuria, flank pain and difficulty urinating.  Musculoskeletal: denies  myalgias, back pain, joint swelling, arthralgias and gait problem.   Skin: denies pallor, rash and wound.  Neurological: denies dizziness, seizures, syncope, weakness, light-headedness, numbness and headaches.   Hematological: denies adenopathy, easy bruising, personal or family bleeding history.  Psychiatric/ Behavioral: denies suicidal ideation, mood changes, confusion, nervousness, sleep disturbance and agitation.       All other systems are reviewed and negative.    PHYSICAL EXAM: VS:  BP 142/94 mmHg  Pulse 68  Ht 6\' 2"  (1.88 m)  Wt 295 lb (133.811 kg)  BMI 37.86 kg/m2 , BMI Body mass index is 37.86 kg/(m^2). GEN: Well nourished, well developed, in no acute distress HEENT: normal Neck: no JVD, carotid bruits, or masses Cardiac: irreg. Irreg. ; no murmurs, rubs, or gallops,no edema  Respiratory:  clear to auscultation bilaterally, normal work of breathing GI: soft, nontender, nondistended, + BS MS: no deformity or atrophy Skin: warm and dry, no rash Neuro:  Strength and sensation are intact Psych: normal   EKG:  EKG is not ordered today.     Recent Labs: 09/08/2014: BUN 13; Creatinine, Ser 0.74; Potassium 4.3; Sodium 136    Lipid Panel    Component Value Date/Time   CHOL 157 05/04/2011 1001   TRIG 175.0* 05/04/2011 1001   HDL 46.90 05/04/2011 1001   CHOLHDL 3 05/04/2011 1001   VLDL 35.0 05/04/2011 1001   LDLCALC 75 05/04/2011 1001      Wt  Readings from Last 3 Encounters:  06/04/15 295 lb (133.811 kg)  12/05/14 284 lb 12.8 oz (129.184 kg)  08/08/14 286 lb 6.4 oz (129.91 kg)      Other studies Reviewed: Additional studies/ records that were reviewed today include: . Review of the above records demonstrates:    ASSESSMENT AND PLAN:  1.  Atrial fibrillation - he remains in atrial fibrillation.  Continue current dose of Coumadin. His heart rate is fairly slow.   Has his coumadin checked in our coumadin clinic.   Levels are good   2. Obesity 3. Hyperlipidemia - followed by his general medical doctor. 4.  Hypertension-   He is tolerating the Valsartan quite well .   Current medicines are reviewed at length with the patient today.  The patient does not have concerns regarding medicines.  The  following changes have been made:  no change  Labs/ tests ordered today include:  No orders of the defined types were placed in this encounter.     Disposition:   FU with me in 1 year    Signed, Adriyanna Christians, Wonda Cheng, MD  06/04/2015 11:03 AM    [facility] [address], [place], [place]  [address] Phone: [phone]; Fax: [phone]

## 2015-06-17 ENCOUNTER — Ambulatory Visit (INDEPENDENT_AMBULATORY_CARE_PROVIDER_SITE_OTHER): Payer: Medicare Other | Admitting: *Deleted

## 2015-06-17 DIAGNOSIS — Z7901 Long term (current) use of anticoagulants: Secondary | ICD-10-CM | POA: Diagnosis not present

## 2015-06-17 DIAGNOSIS — I4891 Unspecified atrial fibrillation: Secondary | ICD-10-CM | POA: Diagnosis not present

## 2015-06-17 LAB — POCT INR: INR: 2

## 2015-07-29 ENCOUNTER — Ambulatory Visit (INDEPENDENT_AMBULATORY_CARE_PROVIDER_SITE_OTHER): Payer: Medicare Other | Admitting: Pharmacist

## 2015-07-29 DIAGNOSIS — Z7901 Long term (current) use of anticoagulants: Secondary | ICD-10-CM

## 2015-07-29 DIAGNOSIS — I4891 Unspecified atrial fibrillation: Secondary | ICD-10-CM

## 2015-07-29 LAB — POCT INR: INR: 2.4

## 2015-08-24 DIAGNOSIS — R319 Hematuria, unspecified: Secondary | ICD-10-CM | POA: Diagnosis not present

## 2015-09-09 ENCOUNTER — Ambulatory Visit (INDEPENDENT_AMBULATORY_CARE_PROVIDER_SITE_OTHER): Payer: Medicare Other | Admitting: *Deleted

## 2015-09-09 DIAGNOSIS — I4891 Unspecified atrial fibrillation: Secondary | ICD-10-CM

## 2015-09-09 DIAGNOSIS — Z7901 Long term (current) use of anticoagulants: Secondary | ICD-10-CM

## 2015-09-09 LAB — POCT INR: INR: 3.2

## 2015-09-23 ENCOUNTER — Ambulatory Visit (INDEPENDENT_AMBULATORY_CARE_PROVIDER_SITE_OTHER): Payer: Medicare Other | Admitting: *Deleted

## 2015-09-23 DIAGNOSIS — I4891 Unspecified atrial fibrillation: Secondary | ICD-10-CM

## 2015-09-23 DIAGNOSIS — Z7901 Long term (current) use of anticoagulants: Secondary | ICD-10-CM | POA: Diagnosis not present

## 2015-09-23 LAB — POCT INR: INR: 2.3

## 2015-09-29 ENCOUNTER — Other Ambulatory Visit: Payer: Self-pay | Admitting: Cardiovascular Disease

## 2015-09-29 ENCOUNTER — Other Ambulatory Visit: Payer: Self-pay | Admitting: *Deleted

## 2015-09-29 DIAGNOSIS — I10 Essential (primary) hypertension: Secondary | ICD-10-CM

## 2015-09-29 DIAGNOSIS — I4891 Unspecified atrial fibrillation: Secondary | ICD-10-CM

## 2015-09-29 MED ORDER — METOPROLOL SUCCINATE ER 100 MG PO TB24: 100.0000 mg | ORAL_TABLET | Freq: Every day | ORAL | Status: DC

## 2015-09-29 NOTE — Telephone Encounter (Signed)
Rx has been sent as requested. 

## 2015-10-24 ENCOUNTER — Other Ambulatory Visit: Payer: Self-pay | Admitting: Cardiovascular Disease

## 2015-10-30 DIAGNOSIS — T22212A Burn of second degree of left forearm, initial encounter: Secondary | ICD-10-CM | POA: Diagnosis not present

## 2015-11-04 ENCOUNTER — Ambulatory Visit (INDEPENDENT_AMBULATORY_CARE_PROVIDER_SITE_OTHER): Payer: Medicare Other | Admitting: *Deleted

## 2015-11-04 DIAGNOSIS — I4891 Unspecified atrial fibrillation: Secondary | ICD-10-CM | POA: Diagnosis not present

## 2015-11-04 DIAGNOSIS — Z7901 Long term (current) use of anticoagulants: Secondary | ICD-10-CM

## 2015-11-04 LAB — POCT INR: INR: 2.1

## 2015-12-01 DIAGNOSIS — Z720 Tobacco use: Secondary | ICD-10-CM | POA: Diagnosis not present

## 2015-12-01 DIAGNOSIS — E669 Obesity, unspecified: Secondary | ICD-10-CM | POA: Diagnosis not present

## 2015-12-01 DIAGNOSIS — I10 Essential (primary) hypertension: Secondary | ICD-10-CM | POA: Diagnosis not present

## 2015-12-01 DIAGNOSIS — E7800 Pure hypercholesterolemia, unspecified: Secondary | ICD-10-CM | POA: Diagnosis not present

## 2015-12-01 DIAGNOSIS — I482 Chronic atrial fibrillation: Secondary | ICD-10-CM | POA: Diagnosis not present

## 2015-12-01 DIAGNOSIS — Z6838 Body mass index (BMI) 38.0-38.9, adult: Secondary | ICD-10-CM | POA: Diagnosis not present

## 2015-12-01 DIAGNOSIS — Z0000 Encounter for general adult medical examination without abnormal findings: Secondary | ICD-10-CM | POA: Diagnosis not present

## 2015-12-01 DIAGNOSIS — Z1389 Encounter for screening for other disorder: Secondary | ICD-10-CM | POA: Diagnosis not present

## 2015-12-16 ENCOUNTER — Ambulatory Visit (INDEPENDENT_AMBULATORY_CARE_PROVIDER_SITE_OTHER): Payer: Medicare Other | Admitting: *Deleted

## 2015-12-16 DIAGNOSIS — I4891 Unspecified atrial fibrillation: Secondary | ICD-10-CM | POA: Diagnosis not present

## 2015-12-16 DIAGNOSIS — Z7901 Long term (current) use of anticoagulants: Secondary | ICD-10-CM

## 2015-12-16 LAB — POCT INR: INR: 2.4

## 2016-01-06 DIAGNOSIS — Z23 Encounter for immunization: Secondary | ICD-10-CM | POA: Diagnosis not present

## 2016-01-25 DIAGNOSIS — T148XXA Other injury of unspecified body region, initial encounter: Secondary | ICD-10-CM | POA: Diagnosis not present

## 2016-01-27 ENCOUNTER — Ambulatory Visit (INDEPENDENT_AMBULATORY_CARE_PROVIDER_SITE_OTHER): Payer: Medicare Other | Admitting: *Deleted

## 2016-01-27 DIAGNOSIS — Z7901 Long term (current) use of anticoagulants: Secondary | ICD-10-CM | POA: Diagnosis not present

## 2016-01-27 DIAGNOSIS — I4891 Unspecified atrial fibrillation: Secondary | ICD-10-CM

## 2016-01-27 LAB — POCT INR: INR: 2.4

## 2016-02-02 DIAGNOSIS — L0292 Furuncle, unspecified: Secondary | ICD-10-CM | POA: Diagnosis not present

## 2016-02-02 DIAGNOSIS — B079 Viral wart, unspecified: Secondary | ICD-10-CM | POA: Diagnosis not present

## 2016-02-02 DIAGNOSIS — D1801 Hemangioma of skin and subcutaneous tissue: Secondary | ICD-10-CM | POA: Diagnosis not present

## 2016-02-02 DIAGNOSIS — D225 Melanocytic nevi of trunk: Secondary | ICD-10-CM | POA: Diagnosis not present

## 2016-02-02 DIAGNOSIS — L853 Xerosis cutis: Secondary | ICD-10-CM | POA: Diagnosis not present

## 2016-02-02 DIAGNOSIS — Z85828 Personal history of other malignant neoplasm of skin: Secondary | ICD-10-CM | POA: Diagnosis not present

## 2016-02-02 DIAGNOSIS — L821 Other seborrheic keratosis: Secondary | ICD-10-CM | POA: Diagnosis not present

## 2016-02-02 DIAGNOSIS — L219 Seborrheic dermatitis, unspecified: Secondary | ICD-10-CM | POA: Diagnosis not present

## 2016-03-09 ENCOUNTER — Ambulatory Visit (INDEPENDENT_AMBULATORY_CARE_PROVIDER_SITE_OTHER): Payer: Medicare Other | Admitting: Pharmacist

## 2016-03-09 DIAGNOSIS — Z7901 Long term (current) use of anticoagulants: Secondary | ICD-10-CM

## 2016-03-09 DIAGNOSIS — I4891 Unspecified atrial fibrillation: Secondary | ICD-10-CM

## 2016-03-09 LAB — POCT INR: INR: 2.4

## 2016-04-20 ENCOUNTER — Ambulatory Visit (INDEPENDENT_AMBULATORY_CARE_PROVIDER_SITE_OTHER): Payer: Medicare Other | Admitting: *Deleted

## 2016-04-20 DIAGNOSIS — Z7901 Long term (current) use of anticoagulants: Secondary | ICD-10-CM

## 2016-04-20 DIAGNOSIS — I4891 Unspecified atrial fibrillation: Secondary | ICD-10-CM

## 2016-04-20 LAB — POCT INR: INR: 2.6

## 2016-06-01 ENCOUNTER — Ambulatory Visit (INDEPENDENT_AMBULATORY_CARE_PROVIDER_SITE_OTHER): Payer: Medicare Other | Admitting: *Deleted

## 2016-06-01 DIAGNOSIS — Z7901 Long term (current) use of anticoagulants: Secondary | ICD-10-CM | POA: Diagnosis not present

## 2016-06-01 DIAGNOSIS — I4891 Unspecified atrial fibrillation: Secondary | ICD-10-CM

## 2016-06-01 LAB — POCT INR: INR: 2.7

## 2016-06-06 DIAGNOSIS — E669 Obesity, unspecified: Secondary | ICD-10-CM | POA: Diagnosis not present

## 2016-06-06 DIAGNOSIS — Z6836 Body mass index (BMI) 36.0-36.9, adult: Secondary | ICD-10-CM | POA: Diagnosis not present

## 2016-06-06 DIAGNOSIS — I10 Essential (primary) hypertension: Secondary | ICD-10-CM | POA: Diagnosis not present

## 2016-06-06 DIAGNOSIS — Z720 Tobacco use: Secondary | ICD-10-CM | POA: Diagnosis not present

## 2016-06-14 ENCOUNTER — Other Ambulatory Visit: Payer: Self-pay | Admitting: Cardiovascular Disease

## 2016-06-14 DIAGNOSIS — I4891 Unspecified atrial fibrillation: Secondary | ICD-10-CM

## 2016-06-14 DIAGNOSIS — I10 Essential (primary) hypertension: Secondary | ICD-10-CM

## 2016-06-24 ENCOUNTER — Ambulatory Visit (INDEPENDENT_AMBULATORY_CARE_PROVIDER_SITE_OTHER): Payer: Medicare Other | Admitting: *Deleted

## 2016-06-24 DIAGNOSIS — I4891 Unspecified atrial fibrillation: Secondary | ICD-10-CM | POA: Diagnosis not present

## 2016-06-24 DIAGNOSIS — Z7901 Long term (current) use of anticoagulants: Secondary | ICD-10-CM | POA: Diagnosis not present

## 2016-06-24 LAB — POCT INR: INR: 2.5

## 2016-06-29 ENCOUNTER — Ambulatory Visit (INDEPENDENT_AMBULATORY_CARE_PROVIDER_SITE_OTHER): Payer: Medicare Other | Admitting: Cardiovascular Disease

## 2016-06-29 ENCOUNTER — Encounter: Payer: Self-pay | Admitting: Cardiovascular Disease

## 2016-06-29 VITALS — BP 144/84 | HR 88 | Ht 73.0 in | Wt 272.1 lb

## 2016-06-29 DIAGNOSIS — E782 Mixed hyperlipidemia: Secondary | ICD-10-CM

## 2016-06-29 DIAGNOSIS — I482 Chronic atrial fibrillation, unspecified: Secondary | ICD-10-CM

## 2016-06-29 NOTE — Progress Notes (Signed)
Cardiology Office Note   Date:  06/29/2016   ID:  John Buckley, DOB 1946/02/28, MRN [id number]  PCP:  Irven Shelling, MD  Cardiologist:   Mertie Moores, MD   Chief Complaint  Patient presents with  . Atrial Fibrillation   1. Atrial fibrillation 2. Obesity 3. Hyperlipidemia  4. Chronic ankle relation 5. Hypertension  History of Present Illness:  John Buckley is a 71 year old gentleman with history of chronic atrial fibrillation, obesity, hyperlipidemia, chronic anticoagulation, hypertension, and ongoing cigarette smoking. He's done very well since I last saw him. Unfortunately he still smokes about a pack of cigarettes a day. He's not had any particular problems.  During his last visit we increased his lisinopril to 20 mg a day which resulted in a well controlled blood pressure. Unfortunately he reduced his dose back down to 10 mg a day because he thought he didn't need the extra dose. He is feeling quite well. He denies any episodes of chest pain or shortness of breath.  July 18, 2012:  John Buckley is doing well. No chest pain, no dyspnea. Getting some exercise.  July 19, 2013:  Feeling well. Exercising some. Still smoking - 1/2 ppd.    August 08, 2014:  John Buckley is a 71 y.o. male who presents for follow-up of his atrial fibrillation. No CP , no dyspnea.  Getting some exercise.   December 05, 2014:   John Buckley is seen today for follow-up of his atrial fibrillation. No chest pains or shortness breath. He's getting a little bit of exercise. Wants to start golfing . Joining silver sneakers   We changed him to Valsartan 160 mg during the last vist. He is tolerating this well.   Feb. 16, 2017:  No cardiac issues.  Had a mass in his right groin  - looked like a bruise,   Popped and has drained. Dr. Laurann Montana has evaluated this  - may have been a cyst  June 29, 2016:  Is doing well Got a new dog recently - Pitbull.  Has lost 27 lbs walking with the dog.     Still in atrial fib    Past Medical History:  Diagnosis Date  . Atrial fibrillation ([place])   . Hyperlipidemia   . Hypertension   . Long-term (current) use of anticoagulants   . Tobacco abuse     Past Surgical History:  Procedure Laterality Date  . CARDIAC CATHETERIZATION  01/15/2003   Est. EF is around 65% --  Relatively smooth coronary arteries.  He has a few minor luminal irregularities -- Normal left ventricular systolic function -- Thayer Headings, M.D.  . CARDIOVERSION  08/04/20006   was successful  . COLONOSCOPY W/ POLYPECTOMY  12/02/2003   A moderate-sized rectal polyp at 15 cm -- ohn C. Amedeo Plenty, M.D.  . NOSE SURGERY    . TONSILLECTOMY       Current Outpatient Prescriptions  Medication Sig Dispense Refill  . amLODipine (NORVASC) 5 MG tablet Take 5 mg by mouth daily.    Marland Kitchen atorvastatin (LIPITOR) 40 MG tablet Take 1 tablet (40 mg total) by mouth daily. 90 tablet 3  . Cholecalciferol (VITAMIN D3) 2000 UNITS capsule Take 4,000 Units by mouth daily.      . fish oil-omega-3 fatty acids 1000 MG capsule Take 2 g by mouth daily.    Marland Kitchen JANTOVEN 5 MG tablet TAKE 1 TABLET AS DIRECTED 90 tablet 1  . metoprolol succinate (TOPROL-XL) 100 MG 24 hr tablet Take 1 tablet (100 mg total)  by mouth daily. 90 tablet 0  . multivitamin (THERAGRAN) per tablet Take 1 tablet by mouth daily.      . valsartan (DIOVAN) 160 MG tablet Take 1 tablet (160 mg total) by mouth daily. 90 tablet 3   No current facility-administered medications for this visit.     Allergies:   Lisinopril    Social History:  The patient  reports that he has been smoking Cigarettes.  He has a 19.50 pack-year smoking history. He has never used smokeless tobacco. He reports that he does not drink alcohol or use drugs.   Family History:  The patient's family history includes Alzheimer's disease in his mother.    ROS:  Please see the history of present illness.    Review of Systems: Constitutional:  denies fever, chills,  diaphoresis, appetite change and fatigue.  HEENT: denies photophobia, eye pain, redness, hearing loss, ear pain, congestion, sore throat, rhinorrhea, sneezing, neck pain, neck stiffness and tinnitus.  Respiratory: denies SOB, DOE, cough, chest tightness, and wheezing.  Cardiovascular: denies chest pain, palpitations and leg swelling.  Gastrointestinal: denies nausea, vomiting, abdominal pain, diarrhea, constipation, blood in stool.  Genitourinary: denies dysuria, urgency, frequency, hematuria, flank pain and difficulty urinating.  Musculoskeletal: denies  myalgias, back pain, joint swelling, arthralgias and gait problem.   Skin: denies pallor, rash and wound.  Neurological: denies dizziness, seizures, syncope, weakness, light-headedness, numbness and headaches.   Hematological: denies adenopathy, easy bruising, personal or family bleeding history.  Psychiatric/ Behavioral: denies suicidal ideation, mood changes, confusion, nervousness, sleep disturbance and agitation.       All other systems are reviewed and negative.    PHYSICAL EXAM: VS:  BP (!) 144/84   Pulse 88   Ht 6\' 1"  (1.854 m)   Wt 272 lb 1.9 oz (123.4 kg)   SpO2 97%   BMI 35.90 kg/m  , BMI Body mass index is 35.9 kg/m. GEN: Well nourished, well developed, in no acute distress  HEENT: normal  Neck: no JVD, carotid bruits, or masses Cardiac: irreg. Irreg. ; no murmurs, rubs, or gallops,no edema  Respiratory:  clear to auscultation bilaterally, normal work of breathing GI: soft, nontender, nondistended, + BS MS: no deformity or atrophy  Skin: warm and dry, no rash Neuro:  Strength and sensation are intact Psych: normal   EKG:  EKG is ordered today.   June 29, 2016:   Atrial fib with V rate of 67.  Otherwise normal ECG     Recent Labs: No results found for requested labs within last 8760 hours.    Lipid Panel    Component Value Date/Time   CHOL 157 05/04/2011 1001   TRIG 175.0 (H) 05/04/2011 1001   HDL  46.90 05/04/2011 1001   CHOLHDL 3 05/04/2011 1001   VLDL 35.0 05/04/2011 1001   LDLCALC 75 05/04/2011 1001      Wt Readings from Last 3 Encounters:  06/29/16 272 lb 1.9 oz (123.4 kg)  06/04/15 295 lb (133.8 kg)  12/05/14 284 lb 12.8 oz (129.2 kg)      Other studies Reviewed: Additional studies/ records that were reviewed today include: . Review of the above records demonstrates:    ASSESSMENT AND PLAN:  1.  Atrial fibrillation - he remains in atrial fibrillation.  Continue current dose of Coumadin. His heart rate is fairly slow.   Has his coumadin checked in our coumadin clinic.   Levels are good   2. Obesity - has lost some weight .   3. Hyperlipidemia -  followed by his general medical doctor. He will send in his labs  4.  Hypertension-   He is tolerating the Valsartan quite well .   Current medicines are reviewed at length with the patient today.  The patient does not have concerns regarding medicines.  The following changes have been made:  no change  Labs/ tests ordered today include:  No orders of the defined types were placed in this encounter.    Disposition:   FU with me in 1 year    Signed, Mertie Moores, MD  06/29/2016 8:31 AM    [facility] [place], [place], [place]  [address] Phone: [phone]; Fax: [phone]

## 2016-06-29 NOTE — Patient Instructions (Signed)

## 2016-07-20 ENCOUNTER — Other Ambulatory Visit: Payer: Self-pay | Admitting: *Deleted

## 2016-07-20 ENCOUNTER — Other Ambulatory Visit: Payer: Self-pay | Admitting: Cardiovascular Disease

## 2016-08-05 ENCOUNTER — Ambulatory Visit (INDEPENDENT_AMBULATORY_CARE_PROVIDER_SITE_OTHER): Payer: Medicare Other | Admitting: Pharmacist

## 2016-08-05 DIAGNOSIS — I4891 Unspecified atrial fibrillation: Secondary | ICD-10-CM | POA: Diagnosis not present

## 2016-08-05 DIAGNOSIS — Z7901 Long term (current) use of anticoagulants: Secondary | ICD-10-CM

## 2016-08-05 LAB — POCT INR: INR: 2.7

## 2016-09-12 ENCOUNTER — Other Ambulatory Visit: Payer: Self-pay | Admitting: Cardiovascular Disease

## 2016-09-12 DIAGNOSIS — I4891 Unspecified atrial fibrillation: Secondary | ICD-10-CM

## 2016-09-12 DIAGNOSIS — I10 Essential (primary) hypertension: Secondary | ICD-10-CM

## 2016-09-16 ENCOUNTER — Ambulatory Visit (INDEPENDENT_AMBULATORY_CARE_PROVIDER_SITE_OTHER): Payer: Medicare Other | Admitting: Pharmacist

## 2016-09-16 DIAGNOSIS — Z7901 Long term (current) use of anticoagulants: Secondary | ICD-10-CM | POA: Diagnosis not present

## 2016-09-16 DIAGNOSIS — I4891 Unspecified atrial fibrillation: Secondary | ICD-10-CM | POA: Diagnosis not present

## 2016-09-16 LAB — POCT INR: INR: 2.2

## 2016-10-26 ENCOUNTER — Ambulatory Visit (INDEPENDENT_AMBULATORY_CARE_PROVIDER_SITE_OTHER): Payer: Medicare Other | Admitting: Pharmacist

## 2016-10-26 DIAGNOSIS — I4891 Unspecified atrial fibrillation: Secondary | ICD-10-CM

## 2016-10-26 DIAGNOSIS — Z7901 Long term (current) use of anticoagulants: Secondary | ICD-10-CM | POA: Diagnosis not present

## 2016-10-26 LAB — POCT INR: INR: 1.9

## 2016-11-16 ENCOUNTER — Encounter: Payer: Self-pay | Admitting: Cardiovascular Disease

## 2016-11-17 ENCOUNTER — Telehealth: Payer: Self-pay | Admitting: Pharmacist

## 2016-11-17 NOTE — Telephone Encounter (Signed)
Dr. Amedeo Plenty office calling over for anticoag clearance for colonoscopy scheduled on 12/02/16. Will need to be off warfarin for 5 days prior.  Patient with diagnosis of Afib on warfarin for anticoagulation.    Procedure: colonoscopy Date of procedure: 12/02/16  CHADS2 score of 1 (HTN, AGE, DM2, stroke/tia x 2); CHADSVASC 2.   Per office protocol, patient can hold warfarin for 5 days prior to procedure.   Patient will not need bridging with Lovenox (enoxaparin) around procedure.  If not bridging, patient should restart warfarin on the evening of procedure or day after, at discretion of procedure MD.    Faxed to Dr. Amedeo Plenty office [phone] per office request.   [facility] with patient as next INR check scheduled for 11/30/16 and he will need to stop coumadin prior to that for procedure.

## 2016-11-17 NOTE — Telephone Encounter (Signed)
Spoke with patient and advised last dose of warfarin on 7/11 and resume with extra 1/2 tablet for 2 days on 7/17 unless otherwise advised. Rescheduled INR check for 7 days post procedure. Pt states appreciation and understanding.

## 2016-11-22 DIAGNOSIS — L02415 Cutaneous abscess of right lower limb: Secondary | ICD-10-CM | POA: Diagnosis not present

## 2016-12-02 DIAGNOSIS — Z86010 Personal history of colonic polyps: Secondary | ICD-10-CM | POA: Diagnosis not present

## 2016-12-02 DIAGNOSIS — K5730 Diverticulosis of large intestine without perforation or abscess without bleeding: Secondary | ICD-10-CM | POA: Diagnosis not present

## 2016-12-02 DIAGNOSIS — K922 Gastrointestinal hemorrhage, unspecified: Secondary | ICD-10-CM | POA: Diagnosis not present

## 2016-12-02 DIAGNOSIS — K635 Polyp of colon: Secondary | ICD-10-CM | POA: Diagnosis not present

## 2016-12-02 DIAGNOSIS — K5289 Other specified noninfective gastroenteritis and colitis: Secondary | ICD-10-CM | POA: Diagnosis not present

## 2016-12-05 DIAGNOSIS — K6389 Other specified diseases of intestine: Secondary | ICD-10-CM | POA: Diagnosis not present

## 2016-12-07 ENCOUNTER — Other Ambulatory Visit: Payer: Self-pay | Admitting: Internal Medicine

## 2016-12-07 DIAGNOSIS — I482 Chronic atrial fibrillation: Secondary | ICD-10-CM | POA: Diagnosis not present

## 2016-12-07 DIAGNOSIS — Z1389 Encounter for screening for other disorder: Secondary | ICD-10-CM | POA: Diagnosis not present

## 2016-12-07 DIAGNOSIS — I10 Essential (primary) hypertension: Secondary | ICD-10-CM | POA: Diagnosis not present

## 2016-12-07 DIAGNOSIS — E669 Obesity, unspecified: Secondary | ICD-10-CM | POA: Diagnosis not present

## 2016-12-07 DIAGNOSIS — F17210 Nicotine dependence, cigarettes, uncomplicated: Secondary | ICD-10-CM | POA: Diagnosis not present

## 2016-12-07 DIAGNOSIS — E7800 Pure hypercholesterolemia, unspecified: Secondary | ICD-10-CM | POA: Diagnosis not present

## 2016-12-07 DIAGNOSIS — Z6832 Body mass index (BMI) 32.0-32.9, adult: Secondary | ICD-10-CM | POA: Diagnosis not present

## 2016-12-07 DIAGNOSIS — R413 Other amnesia: Secondary | ICD-10-CM | POA: Diagnosis not present

## 2016-12-07 DIAGNOSIS — K635 Polyp of colon: Secondary | ICD-10-CM | POA: Diagnosis not present

## 2016-12-07 DIAGNOSIS — Z0000 Encounter for general adult medical examination without abnormal findings: Secondary | ICD-10-CM | POA: Diagnosis not present

## 2016-12-07 DIAGNOSIS — K5289 Other specified noninfective gastroenteritis and colitis: Secondary | ICD-10-CM | POA: Diagnosis not present

## 2016-12-09 ENCOUNTER — Ambulatory Visit (INDEPENDENT_AMBULATORY_CARE_PROVIDER_SITE_OTHER): Payer: Medicare Other | Admitting: *Deleted

## 2016-12-09 DIAGNOSIS — Z7901 Long term (current) use of anticoagulants: Secondary | ICD-10-CM | POA: Diagnosis not present

## 2016-12-09 DIAGNOSIS — I4891 Unspecified atrial fibrillation: Secondary | ICD-10-CM | POA: Diagnosis not present

## 2016-12-09 LAB — POCT INR: INR: 1.5

## 2016-12-15 ENCOUNTER — Ambulatory Visit (INDEPENDENT_AMBULATORY_CARE_PROVIDER_SITE_OTHER): Payer: Medicare Other | Admitting: *Deleted

## 2016-12-15 ENCOUNTER — Ambulatory Visit
Admission: RE | Admit: 2016-12-15 | Discharge: 2016-12-15 | Disposition: A | Discharge status: Home / Self Care | Payer: Medicare Other | Source: Ambulatory Visit | Attending: Internal Medicine | Admitting: Internal Medicine

## 2016-12-15 DIAGNOSIS — F17210 Nicotine dependence, cigarettes, uncomplicated: Secondary | ICD-10-CM

## 2016-12-15 DIAGNOSIS — I4891 Unspecified atrial fibrillation: Secondary | ICD-10-CM

## 2016-12-15 DIAGNOSIS — Z7901 Long term (current) use of anticoagulants: Secondary | ICD-10-CM | POA: Diagnosis not present

## 2016-12-15 LAB — POCT INR: INR: 1.7

## 2016-12-26 ENCOUNTER — Ambulatory Visit (INDEPENDENT_AMBULATORY_CARE_PROVIDER_SITE_OTHER): Payer: Medicare Other | Admitting: *Deleted

## 2016-12-26 DIAGNOSIS — I4891 Unspecified atrial fibrillation: Secondary | ICD-10-CM | POA: Diagnosis not present

## 2016-12-26 DIAGNOSIS — Z7901 Long term (current) use of anticoagulants: Secondary | ICD-10-CM

## 2016-12-26 DIAGNOSIS — I482 Chronic atrial fibrillation, unspecified: Secondary | ICD-10-CM

## 2016-12-26 LAB — POCT INR: INR: 1.9

## 2017-01-10 ENCOUNTER — Ambulatory Visit (INDEPENDENT_AMBULATORY_CARE_PROVIDER_SITE_OTHER): Payer: Medicare Other | Admitting: *Deleted

## 2017-01-10 DIAGNOSIS — Z7901 Long term (current) use of anticoagulants: Secondary | ICD-10-CM

## 2017-01-10 DIAGNOSIS — I4891 Unspecified atrial fibrillation: Secondary | ICD-10-CM

## 2017-01-10 DIAGNOSIS — Z5181 Encounter for therapeutic drug level monitoring: Secondary | ICD-10-CM | POA: Diagnosis not present

## 2017-01-10 LAB — POCT INR: INR: 2.5

## 2017-01-16 ENCOUNTER — Other Ambulatory Visit: Payer: Self-pay | Admitting: *Deleted

## 2017-01-16 ENCOUNTER — Other Ambulatory Visit: Payer: Self-pay | Admitting: Cardiovascular Disease

## 2017-01-23 DIAGNOSIS — Z23 Encounter for immunization: Secondary | ICD-10-CM | POA: Diagnosis not present

## 2017-01-31 ENCOUNTER — Ambulatory Visit (INDEPENDENT_AMBULATORY_CARE_PROVIDER_SITE_OTHER): Payer: Medicare Other | Admitting: *Deleted

## 2017-01-31 DIAGNOSIS — I4891 Unspecified atrial fibrillation: Secondary | ICD-10-CM

## 2017-01-31 DIAGNOSIS — Z7901 Long term (current) use of anticoagulants: Secondary | ICD-10-CM | POA: Diagnosis not present

## 2017-01-31 DIAGNOSIS — Z5181 Encounter for therapeutic drug level monitoring: Secondary | ICD-10-CM | POA: Diagnosis not present

## 2017-01-31 LAB — POCT INR: INR: 3.1

## 2017-02-21 ENCOUNTER — Ambulatory Visit (INDEPENDENT_AMBULATORY_CARE_PROVIDER_SITE_OTHER): Payer: Medicare Other | Admitting: *Deleted

## 2017-02-21 DIAGNOSIS — I4891 Unspecified atrial fibrillation: Secondary | ICD-10-CM

## 2017-02-21 DIAGNOSIS — Z7901 Long term (current) use of anticoagulants: Secondary | ICD-10-CM

## 2017-02-21 DIAGNOSIS — Z5181 Encounter for therapeutic drug level monitoring: Secondary | ICD-10-CM

## 2017-02-21 LAB — POCT INR: INR: 2.5

## 2017-02-27 DIAGNOSIS — L821 Other seborrheic keratosis: Secondary | ICD-10-CM | POA: Diagnosis not present

## 2017-02-27 DIAGNOSIS — L814 Other melanin hyperpigmentation: Secondary | ICD-10-CM | POA: Diagnosis not present

## 2017-02-27 DIAGNOSIS — D229 Melanocytic nevi, unspecified: Secondary | ICD-10-CM | POA: Diagnosis not present

## 2017-02-27 DIAGNOSIS — D1801 Hemangioma of skin and subcutaneous tissue: Secondary | ICD-10-CM | POA: Diagnosis not present

## 2017-03-17 DIAGNOSIS — I10 Essential (primary) hypertension: Secondary | ICD-10-CM | POA: Diagnosis not present

## 2017-03-17 DIAGNOSIS — L02415 Cutaneous abscess of right lower limb: Secondary | ICD-10-CM | POA: Diagnosis not present

## 2017-03-17 DIAGNOSIS — I482 Chronic atrial fibrillation: Secondary | ICD-10-CM | POA: Diagnosis not present

## 2017-03-21 ENCOUNTER — Ambulatory Visit (INDEPENDENT_AMBULATORY_CARE_PROVIDER_SITE_OTHER): Payer: Medicare Other | Admitting: *Deleted

## 2017-03-21 DIAGNOSIS — I4891 Unspecified atrial fibrillation: Secondary | ICD-10-CM

## 2017-03-21 DIAGNOSIS — Z5181 Encounter for therapeutic drug level monitoring: Secondary | ICD-10-CM

## 2017-03-21 DIAGNOSIS — Z7901 Long term (current) use of anticoagulants: Secondary | ICD-10-CM

## 2017-03-21 LAB — POCT INR: INR: 2.6

## 2017-03-21 NOTE — Patient Instructions (Signed)
Continue taking 1 pill everyday except 1/2 pill on Mondays and Fridays. Recheck in 5 weeks. Call with any medication changes or procedures [phone]

## 2017-04-14 DIAGNOSIS — I10 Essential (primary) hypertension: Secondary | ICD-10-CM | POA: Diagnosis not present

## 2017-04-14 DIAGNOSIS — I482 Chronic atrial fibrillation: Secondary | ICD-10-CM | POA: Diagnosis not present

## 2017-04-25 ENCOUNTER — Ambulatory Visit (INDEPENDENT_AMBULATORY_CARE_PROVIDER_SITE_OTHER): Payer: Medicare Other | Admitting: *Deleted

## 2017-04-25 DIAGNOSIS — Z7901 Long term (current) use of anticoagulants: Secondary | ICD-10-CM | POA: Diagnosis not present

## 2017-04-25 DIAGNOSIS — Z5181 Encounter for therapeutic drug level monitoring: Secondary | ICD-10-CM | POA: Diagnosis not present

## 2017-04-25 DIAGNOSIS — I4891 Unspecified atrial fibrillation: Secondary | ICD-10-CM | POA: Diagnosis not present

## 2017-04-25 LAB — POCT INR: INR: 2.7

## 2017-04-25 NOTE — Patient Instructions (Signed)
Description   Continue taking 1 pill everyday except 1/2 pill on Mondays and Fridays. Recheck in 6 weeks. Call with any medication changes or procedures [phone]

## 2017-06-02 ENCOUNTER — Other Ambulatory Visit: Payer: Self-pay | Admitting: Cardiovascular Disease

## 2017-06-02 DIAGNOSIS — I4891 Unspecified atrial fibrillation: Secondary | ICD-10-CM

## 2017-06-02 DIAGNOSIS — I10 Essential (primary) hypertension: Secondary | ICD-10-CM

## 2017-06-06 ENCOUNTER — Ambulatory Visit (INDEPENDENT_AMBULATORY_CARE_PROVIDER_SITE_OTHER): Payer: Medicare Other | Admitting: *Deleted

## 2017-06-06 DIAGNOSIS — Z7901 Long term (current) use of anticoagulants: Secondary | ICD-10-CM | POA: Diagnosis not present

## 2017-06-06 DIAGNOSIS — Z5181 Encounter for therapeutic drug level monitoring: Secondary | ICD-10-CM | POA: Diagnosis not present

## 2017-06-06 DIAGNOSIS — I4891 Unspecified atrial fibrillation: Secondary | ICD-10-CM | POA: Diagnosis not present

## 2017-06-06 LAB — POCT INR: INR: 3.1

## 2017-06-06 NOTE — Patient Instructions (Signed)
Description   Today Feb 19th take 1/2 tablet of coumadin then continue taking 1 tablet everyday except 1/2 tablet on Mondays and Fridays. Recheck in 3 weeks. Call with any medication changes or procedures [phone]

## 2017-06-09 ENCOUNTER — Encounter: Payer: Self-pay | Admitting: Neurology

## 2017-06-09 DIAGNOSIS — I10 Essential (primary) hypertension: Secondary | ICD-10-CM | POA: Diagnosis not present

## 2017-06-09 DIAGNOSIS — G3184 Mild cognitive impairment, so stated: Secondary | ICD-10-CM | POA: Diagnosis not present

## 2017-06-22 DIAGNOSIS — L02224 Furuncle of groin: Secondary | ICD-10-CM | POA: Diagnosis not present

## 2017-06-27 ENCOUNTER — Ambulatory Visit (INDEPENDENT_AMBULATORY_CARE_PROVIDER_SITE_OTHER): Payer: Medicare Other | Admitting: Pharmacist

## 2017-06-27 DIAGNOSIS — I4891 Unspecified atrial fibrillation: Secondary | ICD-10-CM | POA: Diagnosis not present

## 2017-06-27 DIAGNOSIS — Z7901 Long term (current) use of anticoagulants: Secondary | ICD-10-CM

## 2017-06-27 DIAGNOSIS — Z5181 Encounter for therapeutic drug level monitoring: Secondary | ICD-10-CM | POA: Diagnosis not present

## 2017-06-27 LAB — POCT INR: INR: 2.4

## 2017-06-27 NOTE — Patient Instructions (Signed)
Description   Continue taking 1 tablet everyday except 1/2 tablet on Mondays and Fridays. Recheck in 4 weeks. Call with any medication changes or procedures [phone]

## 2017-07-13 DIAGNOSIS — I482 Chronic atrial fibrillation: Secondary | ICD-10-CM | POA: Diagnosis not present

## 2017-07-13 DIAGNOSIS — I10 Essential (primary) hypertension: Secondary | ICD-10-CM | POA: Diagnosis not present

## 2017-07-14 ENCOUNTER — Other Ambulatory Visit: Payer: Self-pay | Admitting: Cardiovascular Disease

## 2017-07-25 ENCOUNTER — Ambulatory Visit (INDEPENDENT_AMBULATORY_CARE_PROVIDER_SITE_OTHER): Payer: Medicare Other | Admitting: *Deleted

## 2017-07-25 DIAGNOSIS — Z5181 Encounter for therapeutic drug level monitoring: Secondary | ICD-10-CM | POA: Diagnosis not present

## 2017-07-25 DIAGNOSIS — I4891 Unspecified atrial fibrillation: Secondary | ICD-10-CM | POA: Diagnosis not present

## 2017-07-25 LAB — POCT INR: INR: 2.3

## 2017-07-25 NOTE — Patient Instructions (Signed)
Description   Continue taking 1 tablet everyday except 1/2 tablet on Mondays and Fridays. Recheck in 5 weeks. Call with any medication changes or procedures [phone]

## 2017-08-11 ENCOUNTER — Encounter: Payer: Self-pay | Admitting: Cardiovascular Disease

## 2017-08-28 ENCOUNTER — Ambulatory Visit (INDEPENDENT_AMBULATORY_CARE_PROVIDER_SITE_OTHER): Payer: Medicare Other | Admitting: Neurology

## 2017-08-28 ENCOUNTER — Other Ambulatory Visit: Payer: Self-pay | Admitting: Cardiovascular Disease

## 2017-08-28 ENCOUNTER — Encounter: Payer: Self-pay | Admitting: Neurology

## 2017-08-28 ENCOUNTER — Other Ambulatory Visit: Payer: Self-pay

## 2017-08-28 VITALS — BP 134/62 | HR 69 | Ht 74.0 in | Wt 231.0 lb

## 2017-08-28 DIAGNOSIS — G3184 Mild cognitive impairment, so stated: Secondary | ICD-10-CM

## 2017-08-28 DIAGNOSIS — I4891 Unspecified atrial fibrillation: Secondary | ICD-10-CM

## 2017-08-28 DIAGNOSIS — I10 Essential (primary) hypertension: Secondary | ICD-10-CM

## 2017-08-28 MED ORDER — DONEPEZIL HCL 10 MG PO TABS: ORAL_TABLET | ORAL | 11 refills | Status: DC

## 2017-08-28 NOTE — Patient Instructions (Addendum)
1. Schedule MRI brain without contrast  We have sent a referral to [facility] for your MRI and they will call you directly to schedule your appt. They are located at [place]. If you need to contact them directly please call [phone].   2. Start Donepezil 10mg : Take 1/2 tablet daily for 2 weeks, then increase to 1 tablet daily 3. Continue to monitor mood 4. Follow-up in 6 months, call for any changes  DRIVING: Regarding driving, in patients with progressive memory problems, driving will be impaired. We advise to have someone else do the driving if trouble finding directions or if minor accidents are reported. Independent driving assessment is available to determine safety of driving.  ABILITY TO BE LEFT ALONE: If patient is unable to contact 911 operator, consider using LifeLine, or when the need is there, arrange for someone to stay with patients. Smoking is a fire hazard, consider supervision or cessation. Risk of wandering should be assessed by caregiver and if detected at any point, supervision and safe proof recommendations should be instituted.  MEDICATION SUPERVISION: Inability to self-administer medication needs to be constantly addressed. Implement a mechanism to ensure safe administration of the medications.  RECOMMENDATIONS FOR ALL PATIENTS WITH MEMORY PROBLEMS: 1. Continue to exercise (Recommend 30 minutes of walking everyday, or 3 hours every week) 2. Increase social interactions - continue going to [place] and enjoy social gatherings with friends and family 3. Eat healthy, avoid fried foods and eat more fruits and vegetables 4. Maintain adequate blood pressure, blood sugar, and blood cholesterol level. Reducing the risk of stroke and cardiovascular disease also helps promoting better memory. 5. Avoid stressful situations. Live a simple life and avoid aggravations. Organize your time and prepare for the next day in anticipation. 6. Sleep well, avoid any  interruptions of sleep and avoid any distractions in the bedroom that may interfere with adequate sleep quality 7. Avoid sugar, avoid sweets as there is a strong link between excessive sugar intake, diabetes, and cognitive impairment We discussed the Mediterranean diet, which has been shown to help patients reduce the risk of progressive memory disorders and reduces cardiovascular risk. This includes eating fish, eat fruits and green leafy vegetables, nuts like almonds and hazelnuts, walnuts, and also use olive oil. Avoid fast foods and fried foods as much as possible. Avoid sweets and sugar as sugar use has been linked to worsening of memory function.  There is always a concern of gradual progression of memory problems. If this is the case, then we may need to adjust level of care according to patient needs. Support, both to the patient and caregiver, should then be put into place.

## 2017-08-28 NOTE — Progress Notes (Signed)
NEUROLOGY CONSULTATION NOTE  KHYREN Buckley MRN: [id number] DOB: [birth year]  Referring provider: Dr. Lavone Orn Primary care provider: Dr. Lavone Orn  Reason for consult:  Memory changes  Dear Dr Laurann Montana:  Thank you for your kind referral of John Buckley for consultation of the above symptoms. Although his history is well known to you, please allow me to reiterate it for the purpose of our medical record. The patient was accompanied to the clinic by his wife who also provides collateral information. Records and images were personally reviewed where available.   HISTORY OF PRESENT ILLNESS: This is a pleasant 72 year old right-handed man with a history of hypertension, hyperlipidemia, atrial fibrillation, tobacco use, presenting for evaluation of worsening memory. He feels his memory is "not worth a hoot." He and his wife started noticing changes a little over a year ago. He feels that it seemed to start all of a sudden, he would be having a conversation and could not think of a word in the middle of his sentence. It takes a minute or two to come back. He is misplacing things frequently at home. He continues to drive without getting lost. Most of the bills are on draft, he denies missing bills. He has an alarm for his medications and rarely misses them. His wife first noticed changes when they were writing Christmas cards together, he was having trouble spelling addresses copying them from their address book. This year they decided to use labels. He has more difficulty concentrating and following programs on TV as easily as he used to. They were driving in [place] last August, which they do every year, but she noticed a change in behavior which was just out of character. They are quite familiar with the city but he became really anxious bout where to turn and what to do. He reports his wife was navigating with her phone and they were using a rental car, which stressed him out. Otherwise  at home, she has not noticed similar significant anxiety. He has always had a quick temper and is a little more irritable/cranky now. No paranoia or hallucinations. He is able to bathe and dress independently.   He has brief unsteadiness when he lays on his right side and gets up, with associated nausea. He denies any headaches, diplopia, dysarthria/dysphagia, neck/back pain, focal numbness/tingling/weakness, bladder dysfunction, tremor. He has frequent bowel movements. His sense of smell has been affected by his smoking. His mother and maternal aunt had dementia. He denies any history of significant head injuries. He drinks alcohol occasionally.   Laboratory Data: 11/2016 TSH and B12 normal at PCP office.  PAST MEDICAL HISTORY: Past Medical History:  Diagnosis Date  . Atrial fibrillation ([place])   . Hyperlipidemia   . Hypertension   . Long-term (current) use of anticoagulants   . Tobacco abuse     PAST SURGICAL HISTORY: Past Surgical History:  Procedure Laterality Date  . CARDIAC CATHETERIZATION  01/15/2003   Est. EF is around 65% --  Relatively smooth coronary arteries.  He has a few minor luminal irregularities -- Normal left ventricular systolic function -- Thayer Headings, M.D.  . CARDIOVERSION  08/04/20006   was successful  . COLONOSCOPY W/ POLYPECTOMY  12/02/2003   A moderate-sized rectal polyp at 15 cm -- ohn C. Amedeo Plenty, M.D.  . NOSE SURGERY    . TONSILLECTOMY      MEDICATIONS: Current Outpatient Medications on File Prior to Visit  Medication Sig Dispense Refill  . amLODipine (  NORVASC) 5 MG tablet Take 5 mg by mouth daily.    Marland Kitchen atorvastatin (LIPITOR) 40 MG tablet Take 1 tablet (40 mg total) by mouth daily. 90 tablet 3  . Cholecalciferol (VITAMIN D3) 2000 UNITS capsule Take 4,000 Units by mouth daily.      . fish oil-omega-3 fatty acids 1000 MG capsule Take 2 g by mouth daily.    Marland Kitchen JANTOVEN 5 MG tablet TAKE AS DIRECTED BY        [facility] 90 tablet 1  . JANTOVEN 5 MG  tablet TAKE AS DIRECTED BY        [facility] 90 tablet 1  . losartan (COZAAR) 100 MG tablet Take 100 mg by mouth daily.    . metoprolol succinate (TOPROL-XL) 100 MG 24 hr tablet Take 1 tablet (100 mg total) by mouth daily. 90 tablet 2  . metoprolol succinate (TOPROL-XL) 100 MG 24 hr tablet TAKE 1 TABLET DAILY 90 tablet 0  . multivitamin (THERAGRAN) per tablet Take 1 tablet by mouth daily.       No current facility-administered medications on file prior to visit.     ALLERGIES: Allergies  Allergen Reactions  . Lisinopril Cough    FAMILY HISTORY: Family History  Problem Relation Age of Onset  . Alzheimer's disease Mother   . Colon cancer Unknown        family history of     SOCIAL HISTORY: Social History   Socioeconomic History  . Marital status: Married    Spouse name: Not on file  . Number of children: Not on file  . Years of education: Not on file  . Highest education level: Not on file  Occupational History  . Not on file  Social Needs  . Financial resource strain: Not on file  . Food insecurity:    Worry: Not on file    Inability: Not on file  . Transportation needs:    Medical: Not on file    Non-medical: Not on file  Tobacco Use  . Smoking status: Current Every Day Smoker    Packs/day: 0.50    Years: 39.00    Pack years: 19.50    Types: Cigarettes  . Smokeless tobacco: Never Used  Substance and Sexual Activity  . Alcohol use: No  . Drug use: No  . Sexual activity: Not on file  Lifestyle  . Physical activity:    Days per week: Not on file    Minutes per session: Not on file  . Stress: Not on file  Relationships  . Social connections:    Talks on phone: Not on file    Gets together: Not on file    Attends religious service: Not on file    Active member of club or organization: Not on file    Attends meetings of clubs or organizations: Not on file    Relationship status: Not on file  . Intimate partner violence:    Fear of current or ex  partner: Not on file    Emotionally abused: Not on file    Physically abused: Not on file    Forced sexual activity: Not on file  Other Topics Concern  . Not on file  Social History Narrative  . Not on file    REVIEW OF SYSTEMS: Constitutional: No fevers, chills, or sweats, no generalized fatigue, change in appetite Eyes: No visual changes, double vision, eye pain Ear, nose and throat: No hearing loss, ear pain, nasal congestion, sore throat Cardiovascular: No chest  pain, palpitations Respiratory:  No shortness of breath at rest or with exertion, wheezes GastrointestinaI: No nausea, vomiting, diarrhea, abdominal pain, fecal incontinence Genitourinary:  No dysuria, urinary retention or frequency Musculoskeletal:  No neck pain, back pain Integumentary: No rash, pruritus, skin lesions Neurological: as above Psychiatric: No depression, insomnia, anxiety Endocrine: No palpitations, fatigue, diaphoresis, mood swings, change in appetite, change in weight, increased thirst Hematologic/Lymphatic:  No anemia, purpura, petechiae. Allergic/Immunologic: no itchy/runny eyes, nasal congestion, recent allergic reactions, rashes  PHYSICAL EXAM: Vitals:   08/28/17 1028  BP: 134/62  Pulse: 69  SpO2: 95%   General: No acute distress Head:  Normocephalic/atraumatic Eyes: Fundoscopic exam shows bilateral sharp discs, no vessel changes, exudates, or hemorrhages Neck: supple, no paraspinal tenderness, full range of motion Back: No paraspinal tenderness Heart: regular rate and rhythm Lungs: Clear to auscultation bilaterally. Vascular: No carotid bruits. Skin/Extremities: No rash, no edema Neurological Exam: Mental status: alert and oriented to person, place, and time, no dysarthria or aphasia, Fund of knowledge is appropriate.  Recent and remote memory are intact.  Attention and concentration are normal.    Able to name objects and repeat phrases. Montreal Cognitive Assessment  08/28/2017    Visuospatial/ Executive (0/5) 4  Naming (0/3) 3  Attention: Read list of digits (0/2) 2  Attention: Read list of letters (0/1) 1  Attention: Serial 7 subtraction starting at 100 (0/3) 3  Language: Repeat phrase (0/2) 2  Language : Fluency (0/1) 0  Abstraction (0/2) 2  Delayed Recall (0/5) 2  Orientation (0/6) 6  Total 25   Cranial nerves: CN I: not tested CN II: pupils equal, round and reactive to light, visual fields intact, fundi unremarkable. CN III, IV, VI:  full range of motion, no nystagmus, no ptosis CN V: facial sensation intact CN VII: upper and lower face symmetric CN VIII: hearing intact to finger rub CN IX, X: gag intact, uvula midline CN XI: sternocleidomastoid and trapezius muscles intact CN XII: tongue midline Bulk & Tone: normal, no fasciculations. Motor: 5/5 throughout with no pronator drift. Sensation: intact to light touch, cold, pin on both UE, decreased pin on left LE, decreased vibration to left knee.  No extinction to double simultaneous stimulation.  Romberg test slight sway Deep Tendon Reflexes: +2 on both UE, +1 both LE, no ankle clonus Plantar responses: downgoing bilaterally Cerebellar: no incoordination on finger to nose. No dysdiadochokinesia Gait: narrow-based and steady, able to tandem walk adequately. Tremor: none  IMPRESSION: This is a pleasant 72 year old right-handed man with a history of hypertension, hyperlipidemia, atrial fibrillation, tobacco use, presenting for evaluation of worsening memory. His neurological exam shows decreased sensation on the left leg. MOCA score 25/30. Symptoms suggestive of mild cognitive impairment, possibly very early mild dementia. TSH and B12 normal. MRI brain without contrast will be ordered to assess for underlying structural abnormality. We discussed starting Donepezil, including side effects and expectations from the medication. We discussed the importance of control of vascular risk factors, smoking cessation,  physical exercise, and brain stimulation exercises for brain health. We discussed mood changes that can occur with memory changes, continue to monitor, he may benefit from an SSRI in the future. He will follow-up in 6 months and knows to call for any changes.   Thank you for allowing me to participate in the care of this patient. Please do not hesitate to call for any questions or concerns.   Ellouise Newer, M.D.  CC: Dr. Laurann Montana

## 2017-08-30 ENCOUNTER — Encounter: Payer: Self-pay | Admitting: Cardiovascular Disease

## 2017-08-30 ENCOUNTER — Ambulatory Visit (INDEPENDENT_AMBULATORY_CARE_PROVIDER_SITE_OTHER): Payer: Medicare Other | Admitting: Cardiovascular Disease

## 2017-08-30 ENCOUNTER — Ambulatory Visit (INDEPENDENT_AMBULATORY_CARE_PROVIDER_SITE_OTHER): Payer: Medicare Other | Admitting: *Deleted

## 2017-08-30 VITALS — BP 110/70 | HR 67 | Ht 74.0 in | Wt 229.8 lb

## 2017-08-30 DIAGNOSIS — I482 Chronic atrial fibrillation, unspecified: Secondary | ICD-10-CM

## 2017-08-30 DIAGNOSIS — E782 Mixed hyperlipidemia: Secondary | ICD-10-CM

## 2017-08-30 DIAGNOSIS — I4891 Unspecified atrial fibrillation: Secondary | ICD-10-CM

## 2017-08-30 DIAGNOSIS — Z5181 Encounter for therapeutic drug level monitoring: Secondary | ICD-10-CM | POA: Diagnosis not present

## 2017-08-30 LAB — BASIC METABOLIC PANEL
BUN/Creatinine Ratio: 19 (ref 10–24)
BUN: 16 mg/dL (ref 8–27)
CALCIUM: 9.4 mg/dL (ref 8.6–10.2)
CHLORIDE: 101 mmol/L (ref 96–106)
CO2: 25 mmol/L (ref 20–29)
Creatinine, Ser: 0.84 mg/dL (ref 0.76–1.27)
GFR calc Af Amer: 101 mL/min/{1.73_m2} (ref >59)
GFR, EST NON AFRICAN AMERICAN: 87 mL/min/{1.73_m2} (ref >59)
Glucose: 93 mg/dL (ref 65–99)
POTASSIUM: 4.3 mmol/L (ref 3.5–5.2)
Sodium: 140 mmol/L (ref 134–144)

## 2017-08-30 LAB — HEPATIC FUNCTION PANEL
ALT: 13 IU/L (ref 0–44)
AST: 19 IU/L (ref 0–40)
Albumin: 4.7 g/dL (ref 3.5–4.8)
Alkaline Phosphatase: 85 IU/L (ref 39–117)
BILIRUBIN TOTAL: 1.1 mg/dL (ref 0.0–1.2)
BILIRUBIN, DIRECT: 0.35 mg/dL (ref 0.00–0.40)
Total Protein: 6.8 g/dL (ref 6.0–8.5)

## 2017-08-30 LAB — POCT INR: INR: 2.7

## 2017-08-30 LAB — LIPID PANEL
CHOLESTEROL TOTAL: 129 mg/dL (ref 100–199)
Chol/HDL Ratio: 2.8 ratio (ref 0.0–5.0)
HDL: 46 mg/dL (ref >39)
LDL CALC: 53 mg/dL (ref 0–99)
TRIGLYCERIDES: 149 mg/dL (ref 0–149)
VLDL Cholesterol Cal: 30 mg/dL (ref 5–40)

## 2017-08-30 NOTE — Patient Instructions (Signed)
Medication Instructions:  Your physician recommends that you continue on your current medications as directed. Please refer to the Current Medication list given to you today.   Labwork: TODAY - cholesterol, liver panel, basic metabolic panel   Testing/Procedures: None Ordered   Follow-Up: Your physician wants you to follow-up in: 1 year with Dr. Nahser.  You will receive a reminder letter in the mail two months in advance. If you don't receive a letter, please call our office to schedule the follow-up appointment.   If you need a refill on your cardiac medications before your next appointment, please call your pharmacy.   Thank you for choosing [facility]! Konnar Ben, RN [phone]    

## 2017-08-30 NOTE — Patient Instructions (Signed)
Description   Continue taking 1 tablet everyday except 1/2 tablet on Mondays and Fridays. Recheck in 6 weeks. Call with any medication changes or procedures [phone]     

## 2017-08-30 NOTE — Progress Notes (Signed)
Cardiology Office Note   Date:  08/30/2017   ID:  John Buckley, DOB [birth year], MRN [id number]  PCP:  Lavone Orn, MD  Cardiologist:   Mertie Moores, MD   Chief Complaint  Patient presents with  . Atrial Fibrillation  . Hypertension   1. Atrial fibrillation 2. Obesity 3. Hyperlipidemia  4. Chronic ankle relation 5. Hypertension  History of Present Illness:  John Buckley is a 72 year old gentleman with history of chronic atrial fibrillation, obesity, hyperlipidemia, chronic anticoagulation, hypertension, and ongoing cigarette smoking. He's done very well since I last saw him. Unfortunately he still smokes about a pack of cigarettes a day. He's not had any particular problems.  During his last visit we increased his lisinopril to 20 mg a day which resulted in a well controlled blood pressure. Unfortunately he reduced his dose back down to 10 mg a day because he thought he didn't need the extra dose. He is feeling quite well. He denies any episodes of chest pain or shortness of breath.  July 18, 2012:  John Buckley is doing well. No chest pain, no dyspnea. Getting some exercise.  July 19, 2013:  Feeling well. Exercising some. Still smoking - 1/2 ppd.    August 08, 2014:  John Buckley is a 72 y.o. male who presents for follow-up of his atrial fibrillation. No CP , no dyspnea.  Getting some exercise.   December 05, 2014:   John Buckley is seen today for follow-up of his atrial fibrillation. No chest pains or shortness breath. He's getting a little bit of exercise. Wants to start golfing . Joining silver sneakers   We changed him to Valsartan 160 mg during the last vist. He is tolerating this well.   Feb. 16, 2017:  No cardiac issues.  Had a mass in his right groin  - looked like a bruise,   Popped and has drained. Dr. Laurann Montana has evaluated this  - may have been a cyst  June 29, 2016:  Is doing well Got a new dog recently - Pitbull.  Has lost 27 lbs walking with the  dog.    Still in atrial fib   Aug 30, 2017:  John Buckley is seen today for follow-up visit.  He remains in atrial fibrillation.  He has a history of hypertension. Has been losing some weight.   Has been on a better diet  Wt. Is 230 , down from 300 several years ago  Feeling well  , has cut back on bread  Still smoking .   Past Medical History:  Diagnosis Date  . Atrial fibrillation ([place])   . Hyperlipidemia   . Hypertension   . Long-term (current) use of anticoagulants   . Tobacco abuse     Past Surgical History:  Procedure Laterality Date  . CARDIAC CATHETERIZATION  01/15/2003   Est. EF is around 65% --  Relatively smooth coronary arteries.  He has a few minor luminal irregularities -- Normal left ventricular systolic function -- Thayer Headings, M.D.  . CARDIOVERSION  08/04/20006   was successful  . COLONOSCOPY W/ POLYPECTOMY  12/02/2003   A moderate-sized rectal polyp at 15 cm -- ohn C. Amedeo Plenty, M.D.  . NOSE SURGERY    . TONSILLECTOMY       Current Outpatient Medications  Medication Sig Dispense Refill  . amLODipine (NORVASC) 5 MG tablet Take 5 mg by mouth daily.    Marland Kitchen atorvastatin (LIPITOR) 40 MG tablet Take 1 tablet (40 mg total) by mouth daily. 90 tablet  3  . Cholecalciferol (VITAMIN D3) 2000 UNITS capsule Take 4,000 Units by mouth daily.      Marland Kitchen donepezil (ARICEPT) 10 MG tablet Take 1/2 tablet daily for 2 weeks, then increase to 1 tablet daily 30 tablet 11  . fish oil-omega-3 fatty acids 1000 MG capsule Take 2 g by mouth daily.    Marland Kitchen JANTOVEN 5 MG tablet TAKE AS DIRECTED BY        [facility] 90 tablet 1  . losartan (COZAAR) 100 MG tablet Take 100 mg by mouth daily.    . metoprolol succinate (TOPROL-XL) 100 MG 24 hr tablet Take 100 mg by mouth daily. Take with or immediately following a meal.    . multivitamin (THERAGRAN) per tablet Take 1 tablet by mouth daily.       No current facility-administered medications for this visit.     Allergies:   Lisinopril    Social  History:  The patient  reports that he has been smoking cigarettes.  He has a 19.50 pack-year smoking history. He has never used smokeless tobacco. He reports that he does not drink alcohol or use drugs.   Family History:  The patient's family history includes Alzheimer's disease in his mother; Colon cancer in his unknown relative.    ROS: Noted in current history, otherwise review of systems is negative.    Physical Exam: Blood pressure 110/70, pulse 67, height 6\' 2"  (1.88 m), weight 229 lb 12.8 oz (104.2 kg), SpO2 99 %.  GEN:  Well nourished, well developed in no acute distress HEENT: Normal NECK: No JVD; No carotid bruits LYMPHATICS: No lymphadenopathy CARDIAC: Irreg. Irreg.   RESPIRATORY:   Few rhonchi bilaterally , especially with cough  ABDOMEN: Soft, non-tender, non-distended MUSCULOSKELETAL:  No edema; No deformity  SKIN: Warm and dry NEUROLOGIC:  Alert and oriented x 3   EKG:   Aug 30, 2017: Atrial fibrillation with a heart rate of 69.  No ST or T wave changes.   Recent Labs: No results found for requested labs within last 8760 hours.    Lipid Panel    Component Value Date/Time   CHOL 157 05/04/2011 1001   TRIG 175.0 (H) 05/04/2011 1001   HDL 46.90 05/04/2011 1001   CHOLHDL 3 05/04/2011 1001   VLDL 35.0 05/04/2011 1001   LDLCALC 75 05/04/2011 1001      Wt Readings from Last 3 Encounters:  08/30/17 229 lb 12.8 oz (104.2 kg)  08/28/17 231 lb (104.8 kg)  06/29/16 272 lb 1.9 oz (123.4 kg)      Other studies Reviewed: Additional studies/ records that were reviewed today include: . Review of the above records demonstrates:    ASSESSMENT AND PLAN:  1.  Atrial fibrillation -   On coumadin.   IRN was 2. 7  HR is well controlled.   2. Obesity -  Has lost 40 lbs since last year   3. Hyperlipidemia -  Will get labs today .  Continue atorvastatin for now    4.  Hypertension-   BP is stable   5.  COPD :   Still smokes.  Encouraged him to stop     Current medicines are reviewed at length with the patient today.  The patient does not have concerns regarding medicines.  The following changes have been made:  no change  Labs/ tests ordered today include:  No orders of the defined types were placed in this encounter.    Disposition:   FU with me in  1 year    Signed, Mertie Moores, MD  08/30/2017 10:00 AM    [address], [place], [place]  [address] Phone: [phone]; Fax: [phone]

## 2017-09-05 ENCOUNTER — Ambulatory Visit
Admission: RE | Admit: 2017-09-05 | Discharge: 2017-09-05 | Disposition: A | Discharge status: Home / Self Care | Payer: Medicare Other | Source: Ambulatory Visit | Attending: Neurology | Admitting: Neurology

## 2017-09-05 DIAGNOSIS — R413 Other amnesia: Secondary | ICD-10-CM | POA: Diagnosis not present

## 2017-09-05 DIAGNOSIS — G3184 Mild cognitive impairment, so stated: Secondary | ICD-10-CM

## 2017-09-07 ENCOUNTER — Telehealth: Payer: Self-pay

## 2017-09-07 NOTE — Telephone Encounter (Signed)
Spoke with pt relaying message below. Pt asked for copy of MRI report.  Let pt know that I will print copy of report and place at our front desk for pt to pick up at his convenience.

## 2017-09-07 NOTE — Telephone Encounter (Signed)
-----   Message from Cameron Sprang, MD sent at 09/06/2017 12:49 PM EDT ----- Pls let patient/wife know the MRI brain is fine, no evidence of tumor, stroke, or bleed. It showed age-related changes. Thanks

## 2017-10-11 ENCOUNTER — Ambulatory Visit (INDEPENDENT_AMBULATORY_CARE_PROVIDER_SITE_OTHER): Payer: Medicare Other | Admitting: Pharmacist

## 2017-10-11 DIAGNOSIS — Z5181 Encounter for therapeutic drug level monitoring: Secondary | ICD-10-CM

## 2017-10-11 DIAGNOSIS — I4891 Unspecified atrial fibrillation: Secondary | ICD-10-CM | POA: Diagnosis not present

## 2017-10-11 LAB — POCT INR: INR: 2.2 (ref 2.0–3.0)

## 2017-10-11 NOTE — Patient Instructions (Signed)
Description   Continue taking 1 tablet everyday except 1/2 tablet on Mondays and Fridays. Recheck in 6 weeks. Call with any medication changes or procedures [phone]

## 2017-11-22 ENCOUNTER — Ambulatory Visit (INDEPENDENT_AMBULATORY_CARE_PROVIDER_SITE_OTHER): Payer: Medicare Other | Admitting: *Deleted

## 2017-11-22 DIAGNOSIS — I4891 Unspecified atrial fibrillation: Secondary | ICD-10-CM | POA: Diagnosis not present

## 2017-11-22 DIAGNOSIS — Z5181 Encounter for therapeutic drug level monitoring: Secondary | ICD-10-CM

## 2017-11-22 LAB — POCT INR: INR: 2.6 (ref 2.0–3.0)

## 2017-11-22 NOTE — Patient Instructions (Signed)
Description   Continue taking 1 tablet everyday except 1/2 tablet on Mondays and Fridays. Recheck in 6 weeks. Call with any medication changes or procedures [phone]

## 2017-12-08 DIAGNOSIS — E669 Obesity, unspecified: Secondary | ICD-10-CM | POA: Diagnosis not present

## 2017-12-08 DIAGNOSIS — G3184 Mild cognitive impairment, so stated: Secondary | ICD-10-CM | POA: Diagnosis not present

## 2017-12-08 DIAGNOSIS — Z1211 Encounter for screening for malignant neoplasm of colon: Secondary | ICD-10-CM | POA: Diagnosis not present

## 2017-12-08 DIAGNOSIS — I10 Essential (primary) hypertension: Secondary | ICD-10-CM | POA: Diagnosis not present

## 2017-12-08 DIAGNOSIS — Z0000 Encounter for general adult medical examination without abnormal findings: Secondary | ICD-10-CM | POA: Diagnosis not present

## 2017-12-08 DIAGNOSIS — E7800 Pure hypercholesterolemia, unspecified: Secondary | ICD-10-CM | POA: Diagnosis not present

## 2017-12-08 DIAGNOSIS — Z720 Tobacco use: Secondary | ICD-10-CM | POA: Diagnosis not present

## 2017-12-08 DIAGNOSIS — I482 Chronic atrial fibrillation: Secondary | ICD-10-CM | POA: Diagnosis not present

## 2017-12-08 DIAGNOSIS — Z1389 Encounter for screening for other disorder: Secondary | ICD-10-CM | POA: Diagnosis not present

## 2017-12-08 DIAGNOSIS — Z6829 Body mass index (BMI) 29.0-29.9, adult: Secondary | ICD-10-CM | POA: Diagnosis not present

## 2018-01-03 ENCOUNTER — Ambulatory Visit (INDEPENDENT_AMBULATORY_CARE_PROVIDER_SITE_OTHER): Payer: Medicare Other

## 2018-01-03 DIAGNOSIS — Z5181 Encounter for therapeutic drug level monitoring: Secondary | ICD-10-CM

## 2018-01-03 DIAGNOSIS — I4891 Unspecified atrial fibrillation: Secondary | ICD-10-CM

## 2018-01-03 LAB — POCT INR: INR: 2.9 (ref 2.0–3.0)

## 2018-01-03 NOTE — Patient Instructions (Signed)
Description   Continue taking 1 tablet everyday except 1/2 tablet on Mondays and Fridays. Recheck in 6 weeks. Call with any medication changes or procedures [phone]

## 2018-01-19 DIAGNOSIS — Z23 Encounter for immunization: Secondary | ICD-10-CM | POA: Diagnosis not present

## 2018-02-09 ENCOUNTER — Ambulatory Visit (INDEPENDENT_AMBULATORY_CARE_PROVIDER_SITE_OTHER): Payer: Medicare Other | Admitting: Pharmacist

## 2018-02-09 DIAGNOSIS — I4891 Unspecified atrial fibrillation: Secondary | ICD-10-CM | POA: Diagnosis not present

## 2018-02-09 DIAGNOSIS — Z5181 Encounter for therapeutic drug level monitoring: Secondary | ICD-10-CM | POA: Diagnosis not present

## 2018-02-09 LAB — POCT INR: INR: 2.6 (ref 2.0–3.0)

## 2018-02-09 NOTE — Patient Instructions (Signed)
Description   Continue taking 1 tablet everyday except 1/2 tablet on Mondays and Fridays. Recheck in 6 weeks. Call with any medication changes or procedures [phone]

## 2018-02-27 DIAGNOSIS — D229 Melanocytic nevi, unspecified: Secondary | ICD-10-CM | POA: Diagnosis not present

## 2018-02-27 DIAGNOSIS — L218 Other seborrheic dermatitis: Secondary | ICD-10-CM | POA: Diagnosis not present

## 2018-02-27 DIAGNOSIS — L819 Disorder of pigmentation, unspecified: Secondary | ICD-10-CM | POA: Diagnosis not present

## 2018-02-27 DIAGNOSIS — Z85828 Personal history of other malignant neoplasm of skin: Secondary | ICD-10-CM | POA: Diagnosis not present

## 2018-02-27 DIAGNOSIS — L821 Other seborrheic keratosis: Secondary | ICD-10-CM | POA: Diagnosis not present

## 2018-02-27 DIAGNOSIS — L814 Other melanin hyperpigmentation: Secondary | ICD-10-CM | POA: Diagnosis not present

## 2018-03-02 DIAGNOSIS — K529 Noninfective gastroenteritis and colitis, unspecified: Secondary | ICD-10-CM | POA: Diagnosis not present

## 2018-03-02 DIAGNOSIS — Z86010 Personal history of colonic polyps: Secondary | ICD-10-CM | POA: Diagnosis not present

## 2018-03-05 ENCOUNTER — Telehealth: Payer: Self-pay

## 2018-03-05 NOTE — Telephone Encounter (Signed)
   [facility] Pre-operative Risk Assessment    Request for surgical clearance:  1. What type of surgery is being performed? Colonoscopy - history of colon polyps/ inflammation of the colonic mucosa  2. When is this surgery scheduled?  04/05/18   3. What type of clearance is required (medical clearance vs. Pharmacy clearance to hold med vs. Both)? Both  4. Are there any medications that need to be held prior to surgery and how long? Coumadin   5. Practice name and name of physician performing surgery?  [facility] , Dr. Therisa Doyne   6. What is your office phone number [phone]    7.   What is your office fax number  [phone]  8.   Anesthesia type (None, local, MAC, general) ? Unknown   John Buckley 03/05/2018, 5:30 PM  _________________________________________________________________   (provider comments below)

## 2018-03-06 NOTE — Telephone Encounter (Signed)
Pt takes warfarin for afib with CHADS2VASc score of 2 (age, HTN). Ok to hold warfarin for 5 days prior to procedure.

## 2018-03-07 NOTE — Telephone Encounter (Signed)
LMTCB

## 2018-03-08 NOTE — Telephone Encounter (Signed)
   Primary Cardiologist: Dr Acie Fredrickson  Chart reviewed and patient interviewed over the phone today as part of pre-operative protocol coverage. Given past medical history and time since last visit, based on ACC/AHA guidelines, BRAY VICKERMAN would be at acceptable risk for the planned procedure without further cardiovascular testing.   Okay to hold Coumadin 5 days preop, resume as soon is possible postoperatively  I will route this recommendation to the requesting party via Epic fax function and remove from pre-op pool.  Please call with questions.  Kerin Ransom, PA-C 03/08/2018, 4:47 PM

## 2018-03-20 ENCOUNTER — Ambulatory Visit (INDEPENDENT_AMBULATORY_CARE_PROVIDER_SITE_OTHER): Payer: Medicare Other | Admitting: Neurology

## 2018-03-20 ENCOUNTER — Encounter: Payer: Self-pay | Admitting: Neurology

## 2018-03-20 VITALS — BP 100/70 | HR 55 | Ht 73.0 in | Wt 216.1 lb

## 2018-03-20 DIAGNOSIS — G3184 Mild cognitive impairment, so stated: Secondary | ICD-10-CM

## 2018-03-20 MED ORDER — DONEPEZIL HCL 10 MG PO TABS: ORAL_TABLET | ORAL | 3 refills | Status: DC

## 2018-03-20 NOTE — Patient Instructions (Signed)
1. Continue Donepezil 10mg  daily 2. Continue to monitor mood 3. Follow-up in 6 months or so, call for any changes  FALL PRECAUTIONS: Be cautious when walking. Scan the area for obstacles that may increase the risk of trips and falls. When getting up in the mornings, sit up at the edge of the bed for a few minutes before getting out of bed. Consider elevating the bed at the head end to avoid drop of blood pressure when getting up. Walk always in a well-lit room (use night lights in the walls). Avoid area rugs or power cords from appliances in the middle of the walkways. Use a walker or a cane if necessary and consider physical therapy for balance exercise. Get your eyesight checked regularly.  HOME SAFETY: Consider the safety of the kitchen when operating appliances like stoves, microwave oven, and blender. Consider having supervision and share cooking responsibilities until no longer able to participate in those. Accidents with firearms and other hazards in the house should be identified and addressed as well.  DRIVING: Regarding driving, in patients with progressive memory problems, driving will be impaired. We advise to have someone else do the driving if trouble finding directions or if minor accidents are reported. Independent driving assessment is available to determine safety of driving.  ABILITY TO BE LEFT ALONE: If patient is unable to contact 911 operator, consider using LifeLine, or when the need is there, arrange for someone to stay with patients. Smoking is a fire hazard, consider supervision or cessation. Risk of wandering should be assessed by caregiver and if detected at any point, supervision and safe proof recommendations should be instituted.  MEDICATION SUPERVISION: Inability to self-administer medication needs to be constantly addressed. Implement a mechanism to ensure safe administration of the medications.  RECOMMENDATIONS FOR ALL PATIENTS WITH MEMORY PROBLEMS: 1. Continue to  exercise (Recommend 30 minutes of walking everyday, or 3 hours every week) 2. Increase social interactions - continue going to [place] and enjoy social gatherings with friends and family 3. Eat healthy, avoid fried foods and eat more fruits and vegetables 4. Maintain adequate blood pressure, blood sugar, and blood cholesterol level. Reducing the risk of stroke and cardiovascular disease also helps promoting better memory. 5. Avoid stressful situations. Live a simple life and avoid aggravations. Organize your time and prepare for the next day in anticipation. 6. Sleep well, avoid any interruptions of sleep and avoid any distractions in the bedroom that may interfere with adequate sleep quality 7. Avoid sugar, avoid sweets as there is a strong link between excessive sugar intake, diabetes, and cognitive impairment The Mediterranean diet has been shown to help patients reduce the risk of progressive memory disorders and reduces cardiovascular risk. This includes eating fish, eat fruits and green leafy vegetables, nuts like almonds and hazelnuts, walnuts, and also use olive oil. Avoid fast foods and fried foods as much as possible. Avoid sweets and sugar as sugar use has been linked to worsening of memory function.  There is always a concern of gradual progression of memory problems. If this is the case, then we may need to adjust level of care according to patient needs. Support, both to the patient and caregiver, should then be put into place.

## 2018-03-20 NOTE — Progress Notes (Signed)
NEUROLOGY FOLLOW UP OFFICE NOTE  John Buckley [id number] 03-13-1946  HISTORY OF PRESENT ILLNESS: I had the pleasure of seeing Levern Pitter in follow-up in the neurology clinic on 03/20/2018. (72 years old)  The patient was last seen on 6 months ago for mild cognitive impairment. MOCA score 25/30 in May 2019. He is again accompanied by his wife who helps supplement the history today.  Records and images were personally reviewed where available. I personally reviewed MRI brain without contrast done 08/2017 which did not show any acute changes. There was moderate diffuse atrophy, mild chronic microvascular disease. He was started on Donepezil 10mg  daily which he is tolerating without side effects. He and his wife report memory is about the same. No driving concerns, he denies getting lost driving. He denies missing bills (majority are online) and occasionally misses medications when he is busy at night. His wife has noticed more personality changes, he used to be able to multitask but now is very single minded and has to finish his train of thought first before going to the next topic. He rages a lot more at the TV. His wife does not think he sleeps well, he takes naps during they day. He denies any headaches, dizziness, vision changes, no falls.   History on Initial Assessment 08/28/2017: This is a pleasant 72 year old right-handed man with a history of hypertension, hyperlipidemia, atrial fibrillation, tobacco use, presenting for evaluation of worsening memory. He feels his memory is "not worth a hoot." He and his wife started noticing changes a little over a year ago. He feels that it seemed to start all of a sudden, he would be having a conversation and could not think of a word in the middle of his sentence. It takes a minute or two to come back. He is misplacing things frequently at home. He continues to drive without getting lost. Most of the bills are on draft, he denies missing bills. He has an alarm for his  medications and rarely misses them. His wife first noticed changes when they were writing Christmas cards together, he was having trouble spelling addresses copying them from their address book. This year they decided to use labels. He has more difficulty concentrating and following programs on TV as easily as he used to. They were driving in [place] last August, which they do every year, but she noticed a change in behavior which was just out of character. They are quite familiar with the city but he became really anxious bout where to turn and what to do. He reports his wife was navigating with her phone and they were using a rental car, which stressed him out. Otherwise at home, she has not noticed similar significant anxiety. He has always had a quick temper and is a little more irritable/cranky now. No paranoia or hallucinations. He is able to bathe and dress independently.   He has brief unsteadiness when he lays on his right side and gets up, with associated nausea. He denies any headaches, diplopia, dysarthria/dysphagia, neck/back pain, focal numbness/tingling/weakness, bladder dysfunction, tremor. He has frequent bowel movements. His sense of smell has been affected by his smoking. His mother and maternal aunt had dementia. He denies any history of significant head injuries. He drinks alcohol occasionally.   Laboratory Data: 11/2016 TSH and B12 normal at PCP office.  PAST MEDICAL HISTORY: Past Medical History:  Diagnosis Date  . Atrial fibrillation ([place])   . Hyperlipidemia   . Hypertension   . Long-term (current) use  of anticoagulants   . Tobacco abuse     MEDICATIONS: Current Outpatient Medications on File Prior to Visit  Medication Sig Dispense Refill  . amLODipine (NORVASC) 5 MG tablet Take 5 mg by mouth daily.    Marland Kitchen atorvastatin (LIPITOR) 40 MG tablet Take 1 tablet (40 mg total) by mouth daily. 90 tablet 3  . Cholecalciferol (VITAMIN D3) 2000 UNITS capsule Take 4,000 Units by mouth  daily.      Marland Kitchen donepezil (ARICEPT) 10 MG tablet Take 1/2 tablet daily for 2 weeks, then increase to 1 tablet daily 30 tablet 11  . fish oil-omega-3 fatty acids 1000 MG capsule Take 2 g by mouth daily.    Marland Kitchen JANTOVEN 5 MG tablet TAKE AS DIRECTED BY        [facility] 90 tablet 1  . losartan (COZAAR) 100 MG tablet Take 100 mg by mouth daily.    . metoprolol succinate (TOPROL-XL) 100 MG 24 hr tablet Take 100 mg by mouth daily. Take with or immediately following a meal.    . multivitamin (THERAGRAN) per tablet Take 1 tablet by mouth daily.       No current facility-administered medications on file prior to visit.     ALLERGIES: Allergies  Allergen Reactions  . Lisinopril Cough    FAMILY HISTORY: Family History  Problem Relation Age of Onset  . Alzheimer's disease Mother   . Colon cancer Unknown        family history of     SOCIAL HISTORY: Social History   Socioeconomic History  . Marital status: Married    Spouse name: Not on file  . Number of children: Not on file  . Years of education: Not on file  . Highest education level: Not on file  Occupational History  . Not on file  Social Needs  . Financial resource strain: Not on file  . Food insecurity:    Worry: Not on file    Inability: Not on file  . Transportation needs:    Medical: Not on file    Non-medical: Not on file  Tobacco Use  . Smoking status: Current Every Day Smoker    Packs/day: 0.50    Years: 39.00    Pack years: 19.50    Types: Cigarettes  . Smokeless tobacco: Never Used  Substance and Sexual Activity  . Alcohol use: No  . Drug use: No  . Sexual activity: Not on file  Lifestyle  . Physical activity:    Days per week: Not on file    Minutes per session: Not on file  . Stress: Not on file  Relationships  . Social connections:    Talks on phone: Not on file    Gets together: Not on file    Attends religious service: Not on file    Active member of club or organization: Not on file    Attends  meetings of clubs or organizations: Not on file    Relationship status: Not on file  . Intimate partner violence:    Fear of current or ex partner: Not on file    Emotionally abused: Not on file    Physically abused: Not on file    Forced sexual activity: Not on file  Other Topics Concern  . Not on file  Social History Narrative  . Not on file    REVIEW OF SYSTEMS: Constitutional: No fevers, chills, or sweats, no generalized fatigue, change in appetite Eyes: No visual changes, double vision, eye pain Ear,  nose and throat: No hearing loss, ear pain, nasal congestion, sore throat Cardiovascular: No chest pain, palpitations Respiratory:  No shortness of breath at rest or with exertion, wheezes GastrointestinaI: No nausea, vomiting, diarrhea, abdominal pain, fecal incontinence Genitourinary:  No dysuria, urinary retention or frequency Musculoskeletal:  No neck pain, back pain Integumentary: No rash, pruritus, skin lesions Neurological: as above Psychiatric: No depression, insomnia, anxiety Endocrine: No palpitations, fatigue, diaphoresis, mood swings, change in appetite, change in weight, increased thirst Hematologic/Lymphatic:  No anemia, purpura, petechiae. Allergic/Immunologic: no itchy/runny eyes, nasal congestion, recent allergic reactions, rashes  PHYSICAL EXAM: Vitals:   03/20/18 1119  BP: 100/70  Pulse: (!) 55  SpO2: 97%   General: No acute distress Head:  Normocephalic/atraumatic Neck: supple, no paraspinal tenderness, full range of motion Heart:  Regular rate and rhythm Lungs:  Clear to auscultation bilaterally Back: No paraspinal tenderness Skin/Extremities: No rash, no edema Neurological Exam: alert and oriented to person, place, and time. No aphasia or dysarthria. Fund of knowledge is appropriate.  Recent and remote memory are intact.  Attention and concentration are normal.    Able to name objects and repeat phrases. CDT 5/5 MMSE - Mini Mental State Exam  03/20/2018  Orientation to time 4  Orientation to Place 5  Registration 3  Attention/ Calculation 3  Recall 2  Language- name 2 objects 2  Language- repeat 1  Language- follow 3 step command 3  Language- read & follow direction 1  Write a sentence 1  Copy design 1  Total score 26   Cranial nerves: Pupils equal, round, reactive to light. Extraocular movements intact with no nystagmus. Visual fields full. Facial sensation intact. No facial asymmetry. Tongue, uvula, palate midline.  Motor: Bulk and tone normal, muscle strength 5/5 throughout with no pronator drift.  Sensation to light touch intact.  No extinction to double simultaneous stimulation. Finger to nose testing intact.  Gait narrow-based and steady, able to tandem walk adequately.  Romberg negative.  IMPRESSION: This is a pleasant 72 yo RH man with a history of hypertension, hyperlipidemia, atrial fibrillation, tobacco use, with mild cognitive impairment. MMSE today 26/30. No significant difficulties with complex tasks. Continue Donepezil 10mg  daily. We again discussed the importance of control of vascular risk factors, smoking cessation, physical exercise, and brain stimulation exercises for brain health. Continue to monitor mood, may consider SSRI in the future. Follow-up in 6 months, they know to call for any changes.   Thank you for allowing me to participate in his care.  Please do not hesitate to call for any questions or concerns.  The duration of this appointment visit was 30 minutes of face-to-face time with the patient.  Greater than 50% of this time was spent in counseling, explanation of diagnosis, planning of further management, and coordination of care.   Ellouise Newer, M.D.   CC: Dr. Laurann Montana

## 2018-03-23 ENCOUNTER — Ambulatory Visit (INDEPENDENT_AMBULATORY_CARE_PROVIDER_SITE_OTHER): Payer: Medicare Other | Admitting: Pharmacist

## 2018-03-23 DIAGNOSIS — Z5181 Encounter for therapeutic drug level monitoring: Secondary | ICD-10-CM | POA: Diagnosis not present

## 2018-03-23 DIAGNOSIS — I4891 Unspecified atrial fibrillation: Secondary | ICD-10-CM | POA: Diagnosis not present

## 2018-03-23 LAB — POCT INR: INR: 3.2 — AB (ref 2.0–3.0)

## 2018-03-23 NOTE — Patient Instructions (Signed)
Description   Skip dose of warfarin today then continue taking 1 tablet everyday except 1/2 tablet on Mondays and Fridays. Then follow the instructions below around procedure. Call with any medication changes or procedures [phone]

## 2018-04-05 DIAGNOSIS — K633 Ulcer of intestine: Secondary | ICD-10-CM | POA: Diagnosis not present

## 2018-04-05 DIAGNOSIS — K922 Gastrointestinal hemorrhage, unspecified: Secondary | ICD-10-CM | POA: Diagnosis not present

## 2018-04-05 DIAGNOSIS — Z86010 Personal history of colonic polyps: Secondary | ICD-10-CM | POA: Diagnosis not present

## 2018-04-05 DIAGNOSIS — D125 Benign neoplasm of sigmoid colon: Secondary | ICD-10-CM | POA: Diagnosis not present

## 2018-04-05 DIAGNOSIS — K529 Noninfective gastroenteritis and colitis, unspecified: Secondary | ICD-10-CM | POA: Diagnosis not present

## 2018-04-05 DIAGNOSIS — K5289 Other specified noninfective gastroenteritis and colitis: Secondary | ICD-10-CM | POA: Diagnosis not present

## 2018-04-05 DIAGNOSIS — K635 Polyp of colon: Secondary | ICD-10-CM | POA: Diagnosis not present

## 2018-04-05 DIAGNOSIS — K5730 Diverticulosis of large intestine without perforation or abscess without bleeding: Secondary | ICD-10-CM | POA: Diagnosis not present

## 2018-04-05 DIAGNOSIS — D123 Benign neoplasm of transverse colon: Secondary | ICD-10-CM | POA: Diagnosis not present

## 2018-04-10 DIAGNOSIS — D125 Benign neoplasm of sigmoid colon: Secondary | ICD-10-CM | POA: Diagnosis not present

## 2018-04-10 DIAGNOSIS — D123 Benign neoplasm of transverse colon: Secondary | ICD-10-CM | POA: Diagnosis not present

## 2018-04-10 DIAGNOSIS — K5289 Other specified noninfective gastroenteritis and colitis: Secondary | ICD-10-CM | POA: Diagnosis not present

## 2018-04-20 ENCOUNTER — Ambulatory Visit (INDEPENDENT_AMBULATORY_CARE_PROVIDER_SITE_OTHER): Payer: Medicare Other | Admitting: Pharmacist

## 2018-04-20 DIAGNOSIS — I4891 Unspecified atrial fibrillation: Secondary | ICD-10-CM

## 2018-04-20 DIAGNOSIS — Z5181 Encounter for therapeutic drug level monitoring: Secondary | ICD-10-CM | POA: Diagnosis not present

## 2018-04-20 LAB — POCT INR: INR: 2.5 (ref 2.0–3.0)

## 2018-04-20 NOTE — Patient Instructions (Signed)
Description   Continue taking 1 tablet everyday except 1/2 tablet on Mondays and Fridays. Then follow the instructions below around procedure. Recheck INR 4 weeks. Call with any medication changes or procedures [phone]

## 2018-05-06 DIAGNOSIS — R319 Hematuria, unspecified: Secondary | ICD-10-CM | POA: Diagnosis not present

## 2018-05-18 ENCOUNTER — Ambulatory Visit (INDEPENDENT_AMBULATORY_CARE_PROVIDER_SITE_OTHER): Payer: Medicare Other

## 2018-05-18 DIAGNOSIS — I4891 Unspecified atrial fibrillation: Secondary | ICD-10-CM | POA: Diagnosis not present

## 2018-05-18 DIAGNOSIS — Z5181 Encounter for therapeutic drug level monitoring: Secondary | ICD-10-CM | POA: Diagnosis not present

## 2018-05-18 LAB — POCT INR: INR: 2.6 (ref 2.0–3.0)

## 2018-05-18 NOTE — Patient Instructions (Signed)
Description   Continue on same dosage 1 tablet everyday except 1/2 tablet on Mondays and Fridays.  Recheck INR 4 weeks. Call with any medication changes or procedures [phone]

## 2018-05-31 DIAGNOSIS — R3915 Urgency of urination: Secondary | ICD-10-CM | POA: Diagnosis not present

## 2018-05-31 DIAGNOSIS — R3121 Asymptomatic microscopic hematuria: Secondary | ICD-10-CM | POA: Diagnosis not present

## 2018-05-31 DIAGNOSIS — R310 Gross hematuria: Secondary | ICD-10-CM | POA: Diagnosis not present

## 2018-05-31 DIAGNOSIS — N401 Enlarged prostate with lower urinary tract symptoms: Secondary | ICD-10-CM | POA: Diagnosis not present

## 2018-06-11 DIAGNOSIS — R310 Gross hematuria: Secondary | ICD-10-CM | POA: Diagnosis not present

## 2018-06-11 DIAGNOSIS — G3184 Mild cognitive impairment, so stated: Secondary | ICD-10-CM | POA: Diagnosis not present

## 2018-06-11 DIAGNOSIS — K529 Noninfective gastroenteritis and colitis, unspecified: Secondary | ICD-10-CM | POA: Diagnosis not present

## 2018-06-11 DIAGNOSIS — I10 Essential (primary) hypertension: Secondary | ICD-10-CM | POA: Diagnosis not present

## 2018-06-15 ENCOUNTER — Ambulatory Visit (INDEPENDENT_AMBULATORY_CARE_PROVIDER_SITE_OTHER): Payer: Medicare Other

## 2018-06-15 DIAGNOSIS — I4891 Unspecified atrial fibrillation: Secondary | ICD-10-CM | POA: Diagnosis not present

## 2018-06-15 DIAGNOSIS — Z5181 Encounter for therapeutic drug level monitoring: Secondary | ICD-10-CM | POA: Diagnosis not present

## 2018-06-15 LAB — POCT INR: INR: 2.5 (ref 2.0–3.0)

## 2018-06-15 NOTE — Patient Instructions (Signed)
Description   Continue on same dosage 1 tablet everyday except 1/2 tablet on Mondays and Fridays.  Recheck INR 6 weeks. Call with any medication changes or procedures [phone]

## 2018-06-25 DIAGNOSIS — N201 Calculus of ureter: Secondary | ICD-10-CM | POA: Diagnosis not present

## 2018-06-25 DIAGNOSIS — N401 Enlarged prostate with lower urinary tract symptoms: Secondary | ICD-10-CM | POA: Diagnosis not present

## 2018-06-25 DIAGNOSIS — R310 Gross hematuria: Secondary | ICD-10-CM | POA: Diagnosis not present

## 2018-06-25 DIAGNOSIS — R3915 Urgency of urination: Secondary | ICD-10-CM | POA: Diagnosis not present

## 2018-07-26 ENCOUNTER — Telehealth: Payer: Self-pay

## 2018-07-26 NOTE — Telephone Encounter (Signed)

## 2018-07-27 ENCOUNTER — Other Ambulatory Visit: Payer: Self-pay

## 2018-07-27 ENCOUNTER — Ambulatory Visit (INDEPENDENT_AMBULATORY_CARE_PROVIDER_SITE_OTHER): Payer: Medicare Other | Admitting: Pharmacist

## 2018-07-27 DIAGNOSIS — Z5181 Encounter for therapeutic drug level monitoring: Secondary | ICD-10-CM

## 2018-07-27 DIAGNOSIS — I4891 Unspecified atrial fibrillation: Secondary | ICD-10-CM

## 2018-07-27 LAB — POCT INR: INR: 3 (ref 2.0–3.0)

## 2018-08-27 DIAGNOSIS — R310 Gross hematuria: Secondary | ICD-10-CM | POA: Diagnosis not present

## 2018-08-27 DIAGNOSIS — N200 Calculus of kidney: Secondary | ICD-10-CM | POA: Diagnosis not present

## 2018-09-05 NOTE — Progress Notes (Signed)
Virtual Visit via Video Note   This visit type was conducted due to national recommendations for restrictions regarding the COVID-19 Pandemic (e.g. social distancing) in an effort to limit this patient's exposure and mitigate transmission in our community.  Due to his co-morbid illnesses, this patient is at least at moderate risk for complications without adequate follow up.  This format is felt to be most appropriate for this patient at this time.  All issues noted in this document were discussed and addressed.  A limited physical exam was performed with this format.  Please refer to the patient's chart for his consent to telehealth for [facility].   Date:  09/06/2018   ID:  John Buckley, DOB 07-14-1945, MRN [id number]  Patient Location: Home Provider Location: Office  PCP:  Lavone Orn, MD  Cardiologist:   Nahser  Electrophysiologist:  None   Evaluation Performed:  Follow-Up Visit  Chief Complaint:  Atrial fib   Problem List  1. Atrial fibrillation 2. Obesity 3. Hyperlipidemia  4. Chronic ankle relation 5. Hypertension  Previous notes   John Buckley is a 73 year old gentleman with history of chronic atrial fibrillation, obesity, hyperlipidemia, chronic anticoagulation, hypertension, and ongoing cigarette smoking. He's done very well since I last saw him. Unfortunately he still smokes about a pack of cigarettes a day. He's not had any particular problems.  During his last visit we increased his lisinopril to 20 mg a day which resulted in a well controlled blood pressure. Unfortunately he reduced his dose back down to 10 mg a day because he thought he didn't need the extra dose. He is feeling quite well. He denies any episodes of chest pain or shortness of breath.  July 18, 2012:  John Buckley is doing well. No chest pain, no dyspnea. Getting some exercise.  July 19, 2013:  Feeling well. Exercising some. Still smoking - 1/2 ppd.    August 08, 2014:  John Buckley is a 73 y.o. male who presents for follow-up of his atrial fibrillation. No CP , no dyspnea.  Getting some exercise.   December 05, 2014:   [name] is seen today for follow-up of his atrial fibrillation. No chest pains or shortness breath. He's getting a little bit of exercise. Wants to start golfing . Joining silver sneakers   We changed him to Valsartan 160 mg during the last vist. He is tolerating this well.   Feb. 16, 2017:  No cardiac issues.  Had a mass in his right groin  - looked like a bruise,   Popped and has drained. Dr. Laurann Montana has evaluated this  - may have been a cyst  June 29, 2016:  Is doing well Got a new dog recently - Pitbull.  Has lost 27 lbs walking with the dog.    Still in atrial fib   Aug 30, 2017:  John Buckley is seen today for follow-up visit.  He remains in atrial fibrillation.  He has a history of hypertension. Has been losing some weight.   Has been on a better diet  Wt. Is 230 , down from 300 several years ago  Feeling well  , has cut back on bread  Still smoking .     Sep 06, 2018  John Buckley is a 73 y.o. male with atrial fib and HTN BP and HR have been great.  Wt today is 194  - down 36 lbs from last year  HAS BEEN eating less and has been exercising daily   Has  been having some bladder issues.  Had kidney stones.    Feeling great  Still smoking - admitted that he needs to work on that INR levels checked here - inr levels have been normal    Wears a mask,  Social distancing  The patient does not have symptoms concerning for COVID-19 infection (fever, chills, cough, or new shortness of breath).    Past Medical History:  Diagnosis Date  . Atrial fibrillation ([place])   . Hyperlipidemia   . Hypertension   . Long-term (current) use of anticoagulants   . Tobacco abuse    Past Surgical History:  Procedure Laterality Date  . CARDIAC CATHETERIZATION  01/15/2003   Est. EF is around 65% --  Relatively smooth coronary  arteries.  He has a few minor luminal irregularities -- Normal left ventricular systolic function -- Thayer Headings, M.D.  . CARDIOVERSION  08/04/20006   was successful  . COLONOSCOPY W/ POLYPECTOMY  12/02/2003   A moderate-sized rectal polyp at 15 cm -- ohn C. Amedeo Plenty, M.D.  . NOSE SURGERY    . TONSILLECTOMY       Current Meds  Medication Sig  . amLODipine (NORVASC) 5 MG tablet Take 5 mg by mouth daily.  Marland Kitchen atorvastatin (LIPITOR) 40 MG tablet Take 1 tablet (40 mg total) by mouth daily.  . Cholecalciferol (VITAMIN D3) 2000 UNITS capsule Take 4,000 Units by mouth daily.    Marland Kitchen donepezil (ARICEPT) 10 MG tablet Take 1 tablet daily  . fish oil-omega-3 fatty acids 1000 MG capsule Take 2 g by mouth once a week.   Marland Kitchen JANTOVEN 5 MG tablet TAKE AS DIRECTED BY        [facility]  . losartan (COZAAR) 100 MG tablet Take 100 mg by mouth daily.  . metoprolol succinate (TOPROL-XL) 100 MG 24 hr tablet Take 100 mg by mouth daily. Take with or immediately following a meal.  . multivitamin (THERAGRAN) per tablet Take 1 tablet by mouth daily.       Allergies:   Lisinopril   Social History   Tobacco Use  . Smoking status: Current Every Day Smoker    Packs/day: 0.50    Years: 39.00    Pack years: 19.50    Types: Cigarettes  . Smokeless tobacco: Never Used  Substance Use Topics  . Alcohol use: No  . Drug use: No     Family Hx: The patient's family history includes Alzheimer's disease in his mother; Colon cancer in his unknown relative.  ROS:   Please see the history of present illness.     All other systems reviewed and are negative.   Prior CV studies:   The following studies were reviewed today:    Labs/Other Tests and Data Reviewed:    EKG:  No ECG reviewed.  Recent Labs: No results found for requested labs within last 8760 hours.   Recent Lipid Panel Lab Results  Component Value Date/Time   CHOL 129 08/30/2017 10:19 AM   TRIG 149 08/30/2017 10:19 AM   HDL 46 08/30/2017  10:19 AM   CHOLHDL 2.8 08/30/2017 10:19 AM   CHOLHDL 3 05/04/2011 10:01 AM   LDLCALC 53 08/30/2017 10:19 AM    Wt Readings from Last 3 Encounters:  09/06/18 194 lb (88 kg)  03/20/18 216 lb 2 oz (98 kg)  08/30/17 229 lb 12.8 oz (104.2 kg)     Objective:    Vital Signs:  BP 117/67 (BP Location: Left Arm, Patient Position: Sitting, Cuff Size: Normal)  Pulse 62   Ht 6\' 2"  (1.88 m)   Wt 194 lb (88 kg)   BMI 24.91 kg/m    VITAL SIGNS:  reviewed GEN:  no acute distress EYES:  sclerae anicteric, EOMI - Extraocular Movements Intact RESPIRATORY:  normal respiratory effort, symmetric expansion CARDIOVASCULAR:  no peripheral edema SKIN:  no rash, lesions or ulcers. MUSCULOSKELETAL:  no obvious deformities. NEURO:  alert and oriented x 3, no obvious focal deficit PSYCH:  normal affect  ASSESSMENT & PLAN:    1. Atrial fib:   HR and BP are stable .  Continue coumadin .   Asked about AF ablation.   He has had long standing AF and is asymptomatic.   Will discuss later    2.  Essential HTN:  BP looks good.   Advised him that we may need to decrease his amlodipine if he continues to lose weight .    COVID-19 Education: The signs and symptoms of COVID-19 were discussed with the patient and how to seek care for testing (follow up with PCP or arrange E-visit).  The importance of social distancing was discussed today.  Time:   Today, I have spent  17  minutes with the patient with telehealth technology discussing the above problems.     Medication Adjustments/Labs and Tests Ordered: Current medicines are reviewed at length with the patient today.  Concerns regarding medicines are outlined above.   Tests Ordered: No orders of the defined types were placed in this encounter.   Medication Changes: No orders of the defined types were placed in this encounter.   Disposition:  Follow up in 6 month(s)  Signed, Mertie Moores, MD  09/06/2018 10:51 AM    [facility]

## 2018-09-06 ENCOUNTER — Telehealth (INDEPENDENT_AMBULATORY_CARE_PROVIDER_SITE_OTHER): Payer: Medicare Other | Admitting: Cardiovascular Disease

## 2018-09-06 ENCOUNTER — Ambulatory Visit: Payer: Medicare Other | Admitting: Cardiovascular Disease

## 2018-09-06 ENCOUNTER — Other Ambulatory Visit: Payer: Self-pay

## 2018-09-06 ENCOUNTER — Encounter: Payer: Self-pay | Admitting: Cardiovascular Disease

## 2018-09-06 VITALS — BP 117/67 | HR 62 | Ht 74.0 in | Wt 194.0 lb

## 2018-09-06 DIAGNOSIS — Z7189 Other specified counseling: Secondary | ICD-10-CM | POA: Diagnosis not present

## 2018-09-06 DIAGNOSIS — I10 Essential (primary) hypertension: Secondary | ICD-10-CM

## 2018-09-06 DIAGNOSIS — I4891 Unspecified atrial fibrillation: Secondary | ICD-10-CM

## 2018-09-06 NOTE — Patient Instructions (Signed)
Medication Instructions:  Your physician recommends that you continue on your current medications as directed. Please refer to the Current Medication list given to you today.  If you need a refill on your cardiac medications before your next appointment, please call your pharmacy.    Lab work: None Ordered   Testing/Procedures: None Ordered   Follow-Up: At [facility], you and your health needs are our priority.  As part of our continuing mission to provide you with exceptional heart care, we have created designated Provider Care Teams.  These Care Teams include your primary Cardiologist (physician) and Advanced Practice Providers (APPs -  Physician Assistants and Nurse Practitioners) who all work together to provide you with the care you need, when you need it. You will need a follow up appointment in:  6 months.  Please call our office 2 months in advance to schedule this appointment.  You may see Dr. Acie Fredrickson or one of the following Advanced Practice Providers on your designated Care Team: Richardson Dopp, PA-C [place], [place] . Daune Perch, NP

## 2018-09-18 ENCOUNTER — Telehealth: Payer: Self-pay

## 2018-09-18 NOTE — Telephone Encounter (Signed)

## 2018-09-21 ENCOUNTER — Other Ambulatory Visit: Payer: Self-pay

## 2018-09-21 ENCOUNTER — Ambulatory Visit (INDEPENDENT_AMBULATORY_CARE_PROVIDER_SITE_OTHER): Payer: Medicare Other | Admitting: *Deleted

## 2018-09-21 DIAGNOSIS — I4891 Unspecified atrial fibrillation: Secondary | ICD-10-CM

## 2018-09-21 DIAGNOSIS — Z5181 Encounter for therapeutic drug level monitoring: Secondary | ICD-10-CM

## 2018-09-21 LAB — POCT INR: INR: 2.4 (ref 2.0–3.0)

## 2018-09-21 MED ORDER — WARFARIN SODIUM 5 MG PO TABS: ORAL_TABLET | ORAL | 1 refills | Status: DC

## 2018-09-21 NOTE — Patient Instructions (Addendum)
  Description   Continue on same dosage 1 tablet every day except 1/2 tablet on Mondays and Fridays.  Recheck INR 8 weeks. Call with any medication changes or procedures [phone]

## 2018-10-05 DIAGNOSIS — R21 Rash and other nonspecific skin eruption: Secondary | ICD-10-CM | POA: Diagnosis not present

## 2018-10-26 ENCOUNTER — Telehealth (INDEPENDENT_AMBULATORY_CARE_PROVIDER_SITE_OTHER): Payer: Medicare Other | Admitting: Neurology

## 2018-10-26 ENCOUNTER — Encounter: Payer: Self-pay | Admitting: Neurology

## 2018-10-26 ENCOUNTER — Other Ambulatory Visit: Payer: Self-pay

## 2018-10-26 DIAGNOSIS — G3184 Mild cognitive impairment, so stated: Secondary | ICD-10-CM | POA: Diagnosis not present

## 2018-10-26 MED ORDER — DONEPEZIL HCL 10 MG PO TABS: ORAL_TABLET | ORAL | 3 refills | Status: DC

## 2018-10-26 NOTE — Progress Notes (Signed)
Virtual Visit via Video Note The purpose of this virtual visit is to provide medical care while limiting exposure to the novel coronavirus.    Consent was obtained for video visit:  Yes.   Answered questions that patient had about telehealth interaction:  Yes.   I discussed the limitations, risks, security and privacy concerns of performing an evaluation and management service by telemedicine. I also discussed with the patient that there may be a patient responsible charge related to this service. The patient expressed understanding and agreed to proceed.  Pt location: Home Physician Location: office Name of referring provider:  Lavone Orn, MD I connected with John Buckley at patients initiation/request on 10/26/2018 at  4:00 PM EDT by video enabled telemedicine application and verified that I am speaking with the correct person using two identifiers. Pt MRN:  [id number] Pt DOB:  29-Apr-1945 Video Participants:  John Buckley;  John Buckley (spouse)   History of Present Illness:  The patient was seen as a virtual video visit on 10/26/2018. He was last seen in December 2019 for mild cognitive impairment. MMSE 26/30 in 03/2018 (25/30 in 08/2017). He is on Donepezil 10mg  daily. He does not think his memory is too good, he gets panicky when someone asks him to do something fast. His wife has not noticed any changes in memory since last visit. He continues to drive without getting lost, wife has no driving concerns. He denies missing medications or bills. He stopped smoking 3 weeks ago. His wife notices he gets frustrated easily, but he has always been one to have a quick temper but resolve quickly. This has not worsened since last visit. He denies any headaches, dizziness, vision changes, focal numbness/tingling/weakness, no falls. Sleep is good. Mood is good.   History on Initial Assessment 08/28/2017: This is a pleasant 73 year old right-handed man with a history of hypertension,  hyperlipidemia, atrial fibrillation, tobacco use, presenting for evaluation of worsening memory. He feels his memory is "not worth a hoot." He and his wife started noticing changes a little over a year ago. He feels that it seemed to start all of a sudden, he would be having a conversation and could not think of a word in the middle of his sentence. It takes a minute or two to come back. He is misplacing things frequently at home. He continues to drive without getting lost. Most of the bills are on draft, he denies missing bills. He has an alarm for his medications and rarely misses them. His wife first noticed changes when they were writing Christmas cards together, he was having trouble spelling addresses copying them from their address book. This year they decided to use labels. He has more difficulty concentrating and following programs on TV as easily as he used to. They were driving in [place] last August, which they do every year, but she noticed a change in behavior which was just out of character. They are quite familiar with the city but he became really anxious bout where to turn and what to do. He reports his wife was navigating with her phone and they were using a rental car, which stressed him out. Otherwise at home, she has not noticed similar significant anxiety. He has always had a quick temper and is a little more irritable/cranky now. No paranoia or hallucinations. He is able to bathe and dress independently.   He has brief unsteadiness when he lays on his right side and gets up, with associated nausea.  He denies any headaches, diplopia, dysarthria/dysphagia, neck/back pain, focal numbness/tingling/weakness, bladder dysfunction, tremor. He has frequent bowel movements. His sense of smell has been affected by his smoking. His mother and maternal aunt had dementia. He denies any history of significant head injuries. He drinks alcohol occasionally.   Laboratory Data: 11/2016 TSH and B12 normal  at PCP office. MRI brain without contrast done 08/2017 which did not show any acute changes. There was moderate diffuse atrophy, mild chronic microvascular disease.   Current Outpatient Medications on File Prior to Visit  Medication Sig Dispense Refill   amLODipine (NORVASC) 5 MG tablet Take 5 mg by mouth daily.     atorvastatin (LIPITOR) 40 MG tablet Take 1 tablet (40 mg total) by mouth daily. 90 tablet 3   Cholecalciferol (VITAMIN D3) 2000 UNITS capsule Take 2,000 Units by mouth daily.      donepezil (ARICEPT) 10 MG tablet Take 1 tablet daily 90 tablet 3   fish oil-omega-3 fatty acids 1000 MG capsule Take 2 g by mouth once a week.      losartan (COZAAR) 100 MG tablet Take 100 mg by mouth daily.     metoprolol succinate (TOPROL-XL) 100 MG 24 hr tablet Take 100 mg by mouth daily. Take with or immediately following a meal.     multivitamin (THERAGRAN) per tablet Take 1 tablet by mouth daily. Takes two per week     warfarin (JANTOVEN) 5 MG tablet TAKE AS DIRECTED BY COUMADIN CLINIC (Patient taking differently: 5 mg. TAKE AS DIRECTED BY COUMADIN CLINIC) 90 tablet 1   No current facility-administered medications on file prior to visit.      Observations/Objective:   Vitals:   10/26/18 1133  Weight: 198 lb 8 oz (90 kg)  Height: 6\' 2"  (1.88 m)   GEN:  The patient appears stated age and is in NAD.  Neurological examination: Patient is awake, alert, oriented x 3. No aphasia or dysarthria. Decreased fluency, intact comprehension. Recent memory intact. Able to name and repeat. Cranial nerves: Extraocular movements intact with no nystagmus. No facial asymmetry. Motor: moves all extremities symmetrically, at least anti-gravity x 4. No incoordination on finger to nose testing. Gait: narrow-based and steady, able to tandem walk adequately. Negative Romberg test.  Montreal Cognitive Assessment  10/26/2018 08/28/2017  Visuospatial/ Executive (0/5) 4 4  Naming (0/3) 3 3  Attention: Read list of  digits (0/2) 1 2  Attention: Read list of letters (0/1) 1 1  Attention: Serial 7 subtraction starting at 100 (0/3) 1 3  Language: Repeat phrase (0/2) 1 2  Language : Fluency (0/1) 0 0  Abstraction (0/2) 0 2  Delayed Recall (0/5) 0 2  Orientation (0/6) 6 6  Total 17 25    Assessment and Plan:   This is a pleasant 73 yo RH man with a history of hypertension, hyperlipidemia, atrial fibrillation, tobacco use, with mild cognitive impairment. MOCA score today 17/30 (25/30 in 08/2017). Although there has been decline with testing, they both continue to report no significant difficulties with complex tasks. Continue Donepezil 10mg  daily. Continue to monitor mood. He was encouraged to continue with smoking cessation. We again discussed the importance of control of vascular risk factors, physical exercise, and brain stimulation exercises for brain health. Follow-up in 6 months, they know to call for any changes.    Follow Up Instructions:    -I discussed the assessment and treatment plan with the patient. The patient was provided an opportunity to ask questions and all were answered.  The patient agreed with the plan and demonstrated an understanding of the instructions.   The patient was advised to call back or seek an in-person evaluation if the symptoms worsen or if the condition fails to improve as anticipated.    Cameron Sprang, MD

## 2018-11-05 IMAGING — CT CT CHEST LUNG CANCER SCREENING LOW DOSE W/O CM
1 of 5 series · 15 of 40 positions shown, 19 images · non-contrast
Comparison: 06/03/2014 chest CT.

CLINICAL DATA: 71-year-old asymptomatic male current smoker with 92
pack-year smoking history.

EXAM:
CT CHEST WITHOUT CONTRAST LOW-DOSE FOR LUNG CANCER SCREENING
TECHNIQUE: Multidetector CT imaging of the chest was performed following the
standard protocol without IV contrast.

[Series 3: lung windows · axial · 0.82mm/px · z∈[-319,+12]mm · 15 of 293 slices shown, 19 images]
[im 14/293  mediastinal]
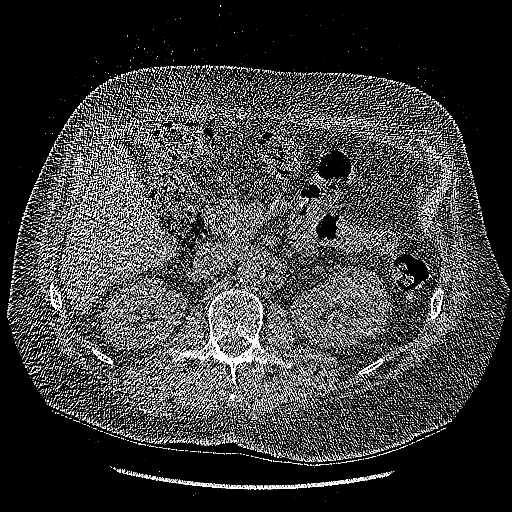
[im 14/293  lung]
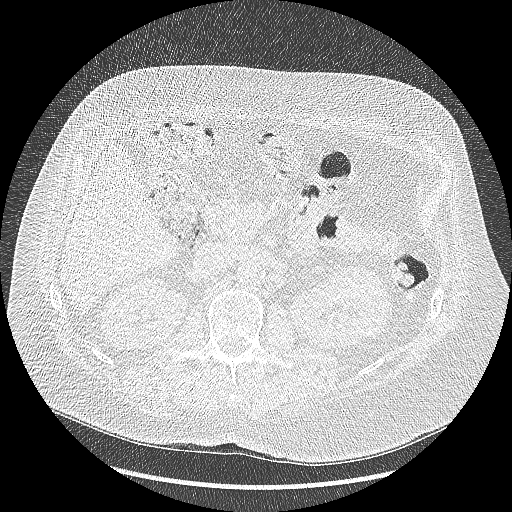
[im 42/293  lung]
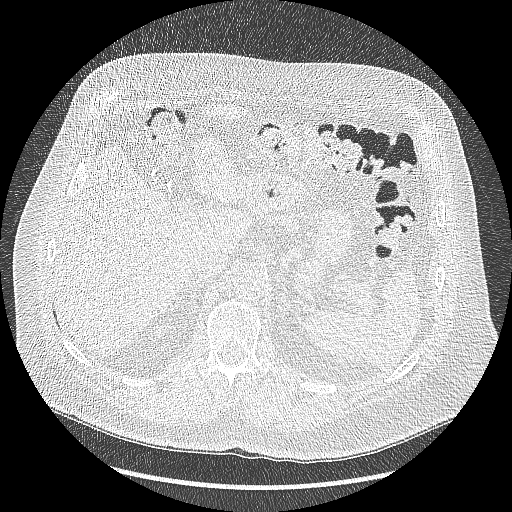
[im 56/293  lung]
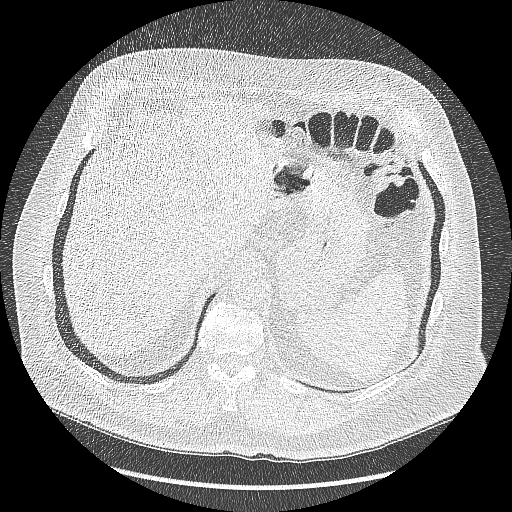
[im 70/293  lung]
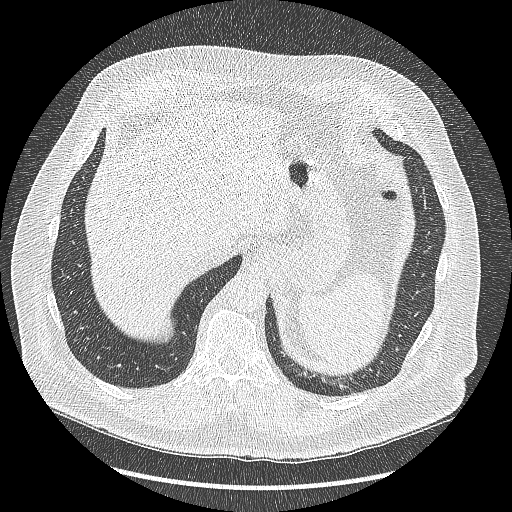
[im 98/293  mediastinal]
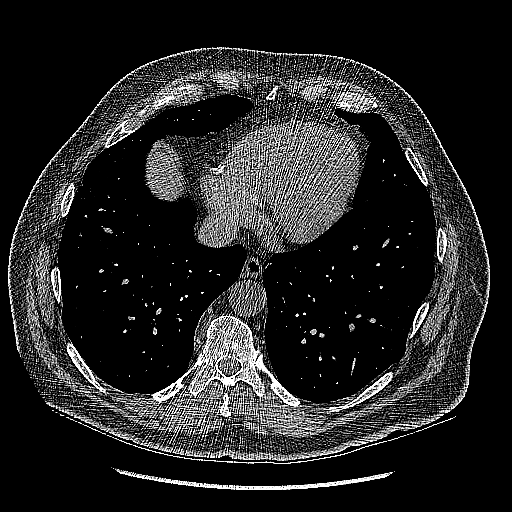
[im 98/293  lung]
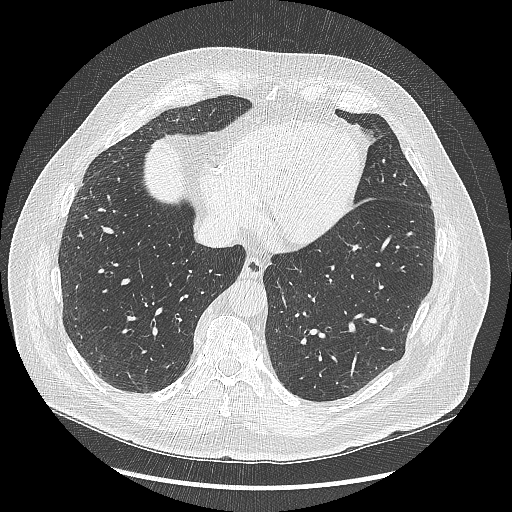
[im 112/293  lung]
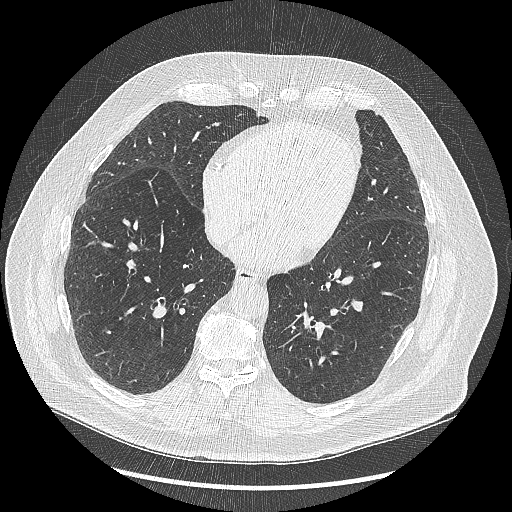
[im 126/293  lung]
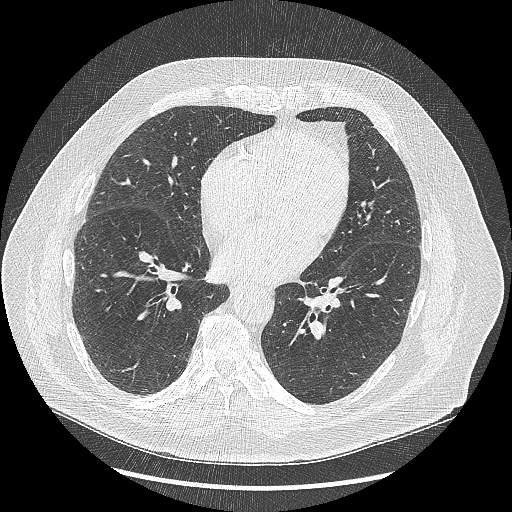
[im 153/293  lung]
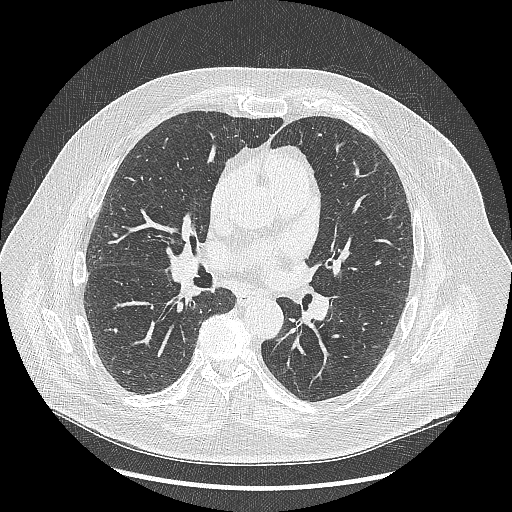
[im 167/293  mediastinal]
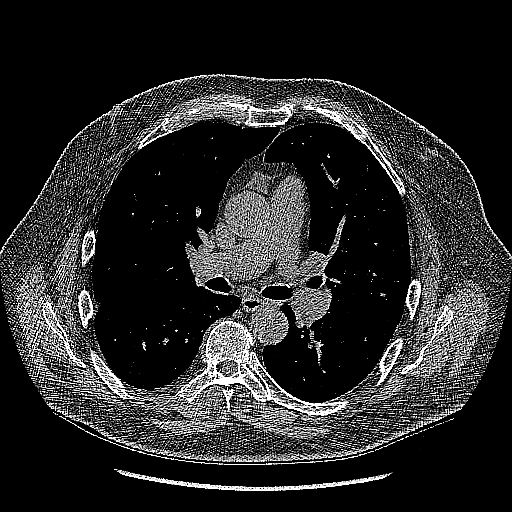
[im 167/293  lung]
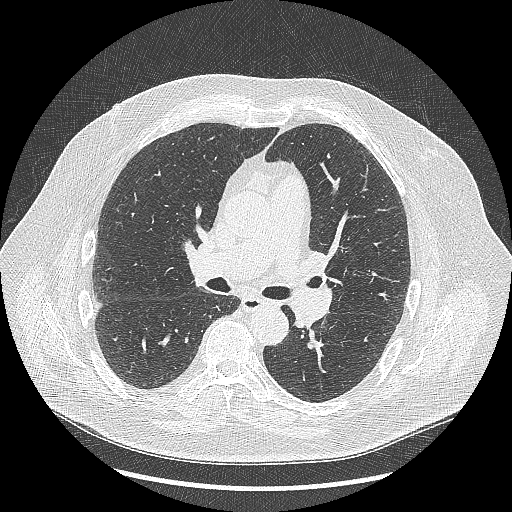
[im 181/293  lung]
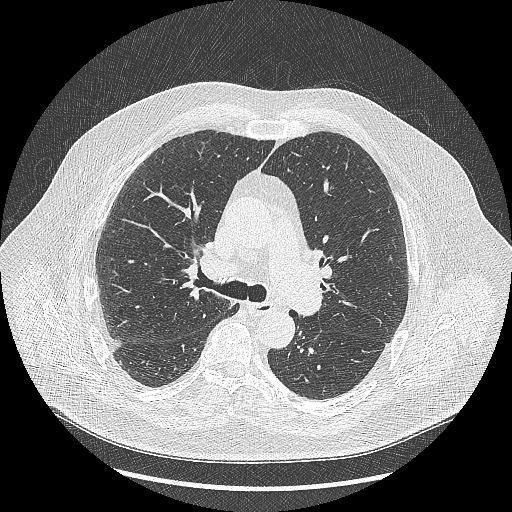
[im 209/293  lung]
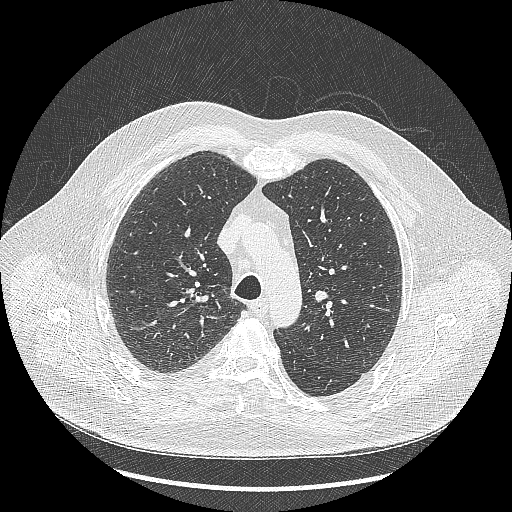
[im 223/293  lung]
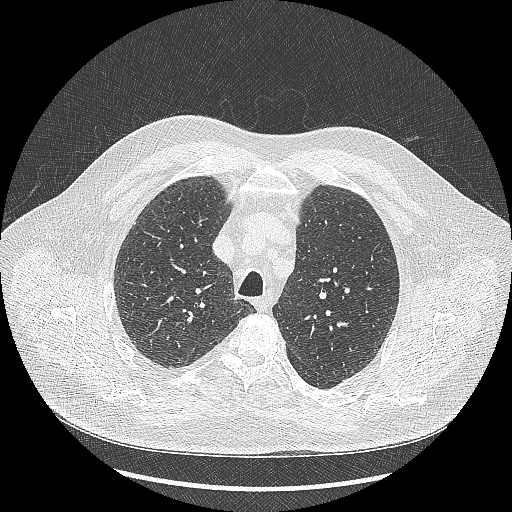
[im 237/293  mediastinal]
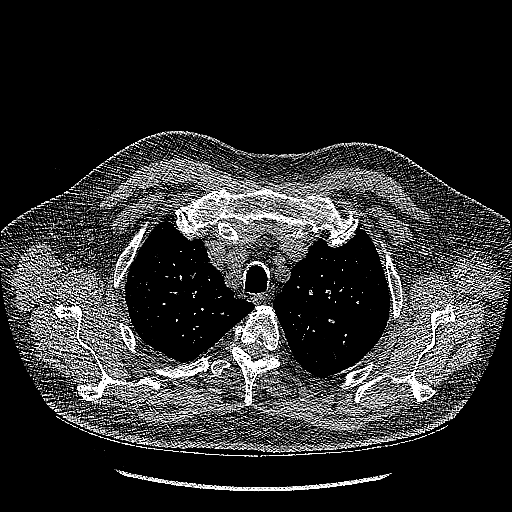
[im 237/293  lung]
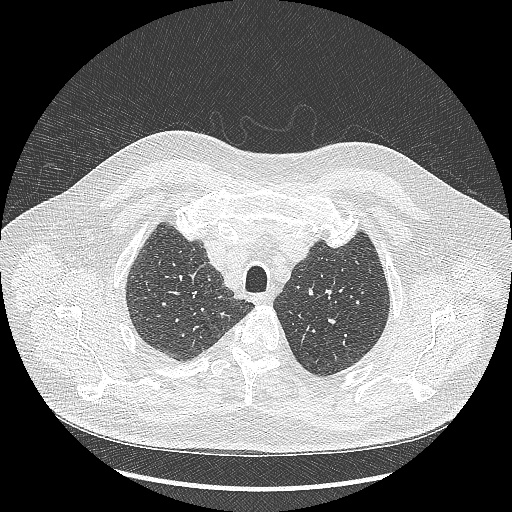
[im 265/293  lung]
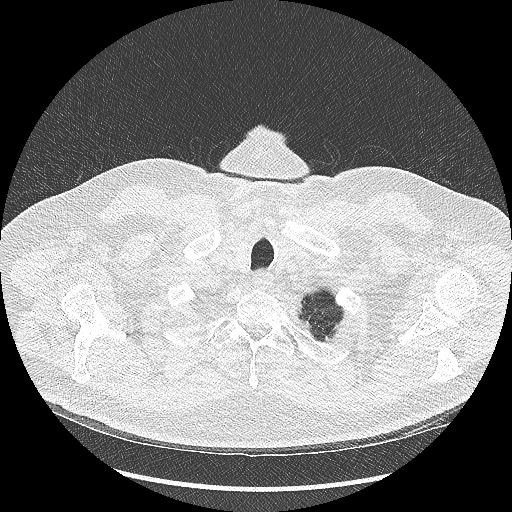
[im 279/293  lung]
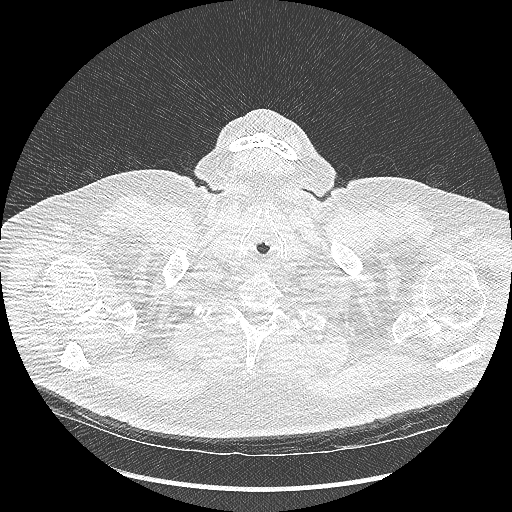

[15 of 40 positions shown; findings below may reference images not displayed]

FINDINGS: Cardiovascular: Normal heart size. No significant pericardial
fluid/thickening. Left anterior descending, left circumflex and
right coronary atherosclerosis. Atherosclerotic nonaneurysmal
thoracic aorta. Normal caliber pulmonary arteries.

Mediastinum/Nodes: No discrete thyroid nodules. Unremarkable
esophagus. No pathologically enlarged axillary, mediastinal or gross
hilar lymph nodes, noting limited sensitivity for the detection of
hilar adenopathy on this noncontrast study.

Lungs/Pleura: No pneumothorax. No pleural effusion. Mild
centrilobular and paraseptal emphysema. No acute consolidative
airspace disease or lung masses. Solitary tiny apical right upper
lobe solid pulmonary nodule measuring 3.2 mm in volume derived mean
diameter (series 3/ image 55). No additional significant pulmonary
nodules.

Upper abdomen: Cholecystectomy.

Musculoskeletal: No aggressive appearing focal osseous lesions. Mild
thoracic spondylosis.
IMPRESSION: 1. Lung-RADS 2-S, benign appearance or behavior. Continue annual
screening with low-dose chest CT without contrast in 12 months.
2. The "S" modifier above refers to potentially clinically
significant non lung cancer related findings. Specifically,
three-vessel coronary atherosclerosis.

Aortic Atherosclerosis (GDNPG-[id number]) and Emphysema ([id number].D).

## 2018-11-14 ENCOUNTER — Telehealth: Payer: Self-pay | Admitting: *Deleted

## 2018-11-14 NOTE — Telephone Encounter (Signed)

## 2018-11-16 ENCOUNTER — Other Ambulatory Visit: Payer: Self-pay

## 2018-11-16 ENCOUNTER — Ambulatory Visit (INDEPENDENT_AMBULATORY_CARE_PROVIDER_SITE_OTHER): Payer: Medicare Other | Admitting: *Deleted

## 2018-11-16 DIAGNOSIS — I4891 Unspecified atrial fibrillation: Secondary | ICD-10-CM | POA: Diagnosis not present

## 2018-11-16 DIAGNOSIS — Z5181 Encounter for therapeutic drug level monitoring: Secondary | ICD-10-CM | POA: Diagnosis not present

## 2018-11-16 LAB — POCT INR: INR: 4.7 — AB (ref 2.0–3.0)

## 2018-11-16 NOTE — Patient Instructions (Signed)
Description   Hold today and tomorrow, then continue on same dosage 1 tablet every day except 1/2 tablet on Mondays and Fridays.  Recheck INR 1 week. Call with any medication changes or procedures [phone]

## 2018-11-26 ENCOUNTER — Other Ambulatory Visit: Payer: Self-pay

## 2018-11-26 ENCOUNTER — Ambulatory Visit (INDEPENDENT_AMBULATORY_CARE_PROVIDER_SITE_OTHER): Payer: Medicare Other

## 2018-11-26 DIAGNOSIS — Z5181 Encounter for therapeutic drug level monitoring: Secondary | ICD-10-CM | POA: Diagnosis not present

## 2018-11-26 DIAGNOSIS — I4891 Unspecified atrial fibrillation: Secondary | ICD-10-CM | POA: Diagnosis not present

## 2018-11-26 LAB — POCT INR: INR: 3.2 — AB (ref 2.0–3.0)

## 2018-11-26 NOTE — Patient Instructions (Signed)
Description   Skip today's dosage of Coumadin, then start taking 1 tablet every day except 1/2 tablet on Mondays, Wednesdays and Fridays.  Recheck INR 2 weeks. Call with any medication changes or procedures [phone]

## 2018-12-10 ENCOUNTER — Ambulatory Visit (INDEPENDENT_AMBULATORY_CARE_PROVIDER_SITE_OTHER): Payer: Medicare Other | Admitting: *Deleted

## 2018-12-10 ENCOUNTER — Other Ambulatory Visit: Payer: Self-pay

## 2018-12-10 DIAGNOSIS — Z5181 Encounter for therapeutic drug level monitoring: Secondary | ICD-10-CM | POA: Diagnosis not present

## 2018-12-10 DIAGNOSIS — I4891 Unspecified atrial fibrillation: Secondary | ICD-10-CM

## 2018-12-10 LAB — POCT INR: INR: 2.6 (ref 2.0–3.0)

## 2018-12-10 NOTE — Patient Instructions (Signed)
Description   Continue taking 1 tablet every day except 1/2 tablet on Mondays, Wednesdays, and Fridays.  Recheck INR 3 weeks. Call with any medication changes or procedures [phone]

## 2018-12-18 ENCOUNTER — Encounter: Payer: Self-pay | Admitting: Cardiovascular Disease

## 2018-12-18 DIAGNOSIS — I10 Essential (primary) hypertension: Secondary | ICD-10-CM | POA: Diagnosis not present

## 2018-12-18 DIAGNOSIS — Z23 Encounter for immunization: Secondary | ICD-10-CM | POA: Diagnosis not present

## 2018-12-18 DIAGNOSIS — Z720 Tobacco use: Secondary | ICD-10-CM | POA: Diagnosis not present

## 2018-12-18 DIAGNOSIS — Z0000 Encounter for general adult medical examination without abnormal findings: Secondary | ICD-10-CM | POA: Diagnosis not present

## 2018-12-18 DIAGNOSIS — Z1389 Encounter for screening for other disorder: Secondary | ICD-10-CM | POA: Diagnosis not present

## 2018-12-18 DIAGNOSIS — E7800 Pure hypercholesterolemia, unspecified: Secondary | ICD-10-CM | POA: Diagnosis not present

## 2018-12-18 DIAGNOSIS — G3184 Mild cognitive impairment, so stated: Secondary | ICD-10-CM | POA: Diagnosis not present

## 2018-12-31 ENCOUNTER — Ambulatory Visit (INDEPENDENT_AMBULATORY_CARE_PROVIDER_SITE_OTHER): Payer: Medicare Other | Admitting: Pharmacist

## 2018-12-31 ENCOUNTER — Other Ambulatory Visit: Payer: Self-pay

## 2018-12-31 DIAGNOSIS — Z5181 Encounter for therapeutic drug level monitoring: Secondary | ICD-10-CM | POA: Diagnosis not present

## 2018-12-31 DIAGNOSIS — I4891 Unspecified atrial fibrillation: Secondary | ICD-10-CM

## 2018-12-31 LAB — POCT INR: INR: 3.8 — AB (ref 2.0–3.0)

## 2018-12-31 NOTE — Patient Instructions (Signed)
Description   Skip today's dose of coumadin. Then continue taking 1 tablet every day except 1/2 tablet on Mondays, Wednesdays, and Fridays.  Recheck INR 3 weeks. Call with any medication changes or procedures [phone]

## 2019-01-11 ENCOUNTER — Telehealth: Payer: Self-pay

## 2019-01-11 NOTE — Telephone Encounter (Signed)

## 2019-01-13 NOTE — Progress Notes (Signed)
Virtual Visit via Telephone Note   This visit type was conducted due to national recommendations for restrictions regarding the COVID-19 Pandemic (e.g. social distancing) in an effort to limit this patient's exposure and mitigate transmission in our community.  Due to his co-morbid illnesses, this patient is at least at moderate risk for complications without adequate follow up.  This format is felt to be most appropriate for this patient at this time.  The patient did not have access to video technology/had technical difficulties with video requiring transitioning to audio format only (telephone).  All issues noted in this document were discussed and addressed.  No physical exam could be performed with this format.  Please refer to the patient's chart for his  consent to telehealth for [facility].   Date:  01/14/2019   ID:  John Buckley, DOB 1946/03/29, MRN [id number]  Patient Location: Home Provider Location: Home  PCP:  Lavone Orn, MD  Cardiologist:  Mertie Moores, MD  Electrophysiologist:  None   Evaluation Performed:  Follow-Up Visit  Chief Complaint:  4 months follow up  History of Present Illness:    John Buckley is a 73 y.o. male with history of chronic atrial fibrillation, obesity, hyperlipidemia, chronic anticoagulation, hypertension, and  cigarette smoking (09/2018) seen for follow up.  Last echo in 2008 showed normal LVEF and mild AS. He was doing well on cardiac stand point when last seen by Dr. Acie Fredrickson in 08/2018.  Seen virtually for follow up.  Recently dealing with fluctuating INR.  No changes in diet or medication.  Comes to our clinic for lab work.  Discussed changing to NOAC.  He will reach out to his insurance company for benefit check.  He has quit tobacco smoking 3 months ago.  He walks every day without chest pain, shortness of breath, palpitation, orthopnea, PND, syncope, lower extremity edema or melena.  Recently her pulse running low in 40s to 50s.   Previously in the 60s-80s.  The patient does not have symptoms concerning for COVID-19 infection (fever, chills, cough, or new shortness of breath).    Past Medical History:  Diagnosis Date  . Atrial fibrillation ([place])   . Hyperlipidemia   . Hypertension   . Long-term (current) use of anticoagulants   . Tobacco abuse    Past Surgical History:  Procedure Laterality Date  . CARDIAC CATHETERIZATION  01/15/2003   Est. EF is around 65% --  Relatively smooth coronary arteries.  He has a few minor luminal irregularities -- Normal left ventricular systolic function -- Thayer Headings, M.D.  . CARDIOVERSION  08/04/20006   was successful  . COLONOSCOPY W/ POLYPECTOMY  12/02/2003   A moderate-sized rectal polyp at 15 cm -- ohn C. Amedeo Plenty, M.D.  . NOSE SURGERY    . TONSILLECTOMY       Current Meds  Medication Sig  . amLODipine (NORVASC) 5 MG tablet Take 5 mg by mouth daily.  Marland Kitchen atorvastatin (LIPITOR) 40 MG tablet Take 1 tablet (40 mg total) by mouth daily.  . Cholecalciferol (VITAMIN D3) 2000 UNITS capsule Take 2,000 Units by mouth daily.   Marland Kitchen donepezil (ARICEPT) 10 MG tablet Take 1 tablet daily  . losartan (COZAAR) 100 MG tablet Take 100 mg by mouth daily.  . multivitamin (THERAGRAN) per tablet Take 1 tablet by mouth daily. Takes two per week  . warfarin (JANTOVEN) 5 MG tablet TAKE AS DIRECTED BY COUMADIN CLINIC (Patient taking differently: 5 mg. TAKE AS DIRECTED BY COUMADIN CLINIC)  . [  DISCONTINUED] fish oil-omega-3 fatty acids 1000 MG capsule Take 2 g by mouth once a week.   . [DISCONTINUED] metoprolol succinate (TOPROL-XL) 100 MG 24 hr tablet Take 100 mg by mouth daily. Take with or immediately following a meal.     Allergies:   Lisinopril   Social History   Tobacco Use  . Smoking status: Current Every Day Smoker    Packs/day: 0.50    Years: 39.00    Pack years: 19.50    Types: Cigarettes  . Smokeless tobacco: Never Used  Substance Use Topics  . Alcohol use: No  . Drug use: No      Family Hx: The patient's family history includes Alzheimer's disease in his mother; Colon cancer in an other family member.  ROS:   Please see the history of present illness.    All other systems reviewed and are negative.   Prior CV studies:   The following studies were reviewed today:  As above  Labs/Other Tests and Data Reviewed:    EKG:  No ECG reviewed.  Recent Labs: No results found for requested labs within last 8760 hours.   Recent Lipid Panel Lab Results  Component Value Date/Time   CHOL 129 08/30/2017 10:19 AM   TRIG 149 08/30/2017 10:19 AM   HDL 46 08/30/2017 10:19 AM   CHOLHDL 2.8 08/30/2017 10:19 AM   CHOLHDL 3 05/04/2011 10:01 AM   LDLCALC 53 08/30/2017 10:19 AM    Wt Readings from Last 3 Encounters:  01/14/19 203 lb (92.1 kg)  10/26/18 198 lb 8 oz (90 kg)  09/06/18 194 lb (88 kg)     Objective:    Vital Signs:  BP (!) 141/75   Pulse (!) 47   Ht 6\' 2"  (1.88 m)   Wt 203 lb (92.1 kg)   BMI 26.06 kg/m    VITAL SIGNS:  reviewed GEN:  no acute distress PSYCH:  normal affect  ASSESSMENT & PLAN:    1. Permanent atrial fibrillation -Slow rate recently.  Asymptomatic.  Reduce Toprol-XL to 75 mg daily.  Fluctuating INR recently.  He will check with insurance company for NOAC benefits.  2. Hypertension -Blood pressure minimally up.  He takes his antihypertensive at night.  Reduce Toprol as above.  He keep eye on his blood pressure.  Blood pressure and pulse checks during INR visit next week.  3. Ongoing tobacco smoking -Congratulated on tobacco cessation.  COVID-19 Education: The signs and symptoms of COVID-19 were discussed with the patient and how to seek care for testing (follow up with PCP or arrange E-visit).  The importance of social distancing was discussed today.  Time:   Today, I have spent 17 minutes with the patient with telehealth technology discussing the above problems.     Medication Adjustments/Labs and Tests Ordered:  Current medicines are reviewed at length with the patient today.  Concerns regarding medicines are outlined above.   Tests Ordered: No orders of the defined types were placed in this encounter.   Medication Changes: No orders of the defined types were placed in this encounter.   Follow Up:  In Person in 6 month(s)  Signed, Leanor Kail, [place]  01/14/2019 8:20 AM    [place]

## 2019-01-14 ENCOUNTER — Encounter: Payer: Self-pay | Admitting: Physician Assistant

## 2019-01-14 ENCOUNTER — Other Ambulatory Visit: Payer: Self-pay

## 2019-01-14 ENCOUNTER — Telehealth (INDEPENDENT_AMBULATORY_CARE_PROVIDER_SITE_OTHER): Payer: Medicare Other | Admitting: Physician Assistant

## 2019-01-14 VITALS — BP 141/75 | HR 47 | Ht 74.0 in | Wt 203.0 lb

## 2019-01-14 DIAGNOSIS — I482 Chronic atrial fibrillation, unspecified: Secondary | ICD-10-CM | POA: Diagnosis not present

## 2019-01-14 DIAGNOSIS — E782 Mixed hyperlipidemia: Secondary | ICD-10-CM

## 2019-01-14 DIAGNOSIS — I10 Essential (primary) hypertension: Secondary | ICD-10-CM

## 2019-01-14 DIAGNOSIS — Z7189 Other specified counseling: Secondary | ICD-10-CM

## 2019-01-14 MED ORDER — METOPROLOL SUCCINATE ER 25 MG PO TB24: 75.0000 mg | ORAL_TABLET | Freq: Every day | ORAL | 6 refills | Status: DC

## 2019-01-14 NOTE — Patient Instructions (Signed)
Medication Instructions:  Your physician has recommended you make the following change in your medication:  1.  REDUCE the Metoprolol to 25 mg taking 3 tablets daily.  This has been sent to [facility].  If you need a refill on your cardiac medications before your next appointment, please call your pharmacy.   Lab work: None ordered  If you have labs (blood work) drawn today and your tests are completely normal, you will receive your results only by: Marland Kitchen MyChart Message (if you have MyChart) OR . A paper copy in the mail If you have any lab test that is abnormal or we need to change your treatment, we will call you to review the results.  Testing/Procedures: None ordered  Follow-Up: At [facility], you and your health needs are our priority.  As part of our continuing mission to provide you with exceptional heart care, we have created designated Provider Care Teams.  These Care Teams include your primary Cardiologist (physician) and Advanced Practice Providers (APPs -  Physician Assistants and Nurse Practitioners) who all work together to provide you with the care you need, when you need it. Marland Kitchen You are scheduled for an in office appointment, for EKG / BP Check on 01/21/2019 with Vin Bhagat, PA-C.    Any Other Special Instructions Will Be Listed Below (If Applicable).

## 2019-01-18 NOTE — Progress Notes (Signed)
Cardiology Office Note    Date:  01/21/2019   ID:  John Buckley, DOB [birth year], MRN [id number]  PCP:  Lavone Orn, MD  Cardiologist: Dr. Acie Fredrickson Chief Complaint: BP follow up   History of Present Illness:   John Buckley is a 73 y.o. male with history of chronic atrial fibrillation, obesity, hyperlipidemia, chronic anticoagulation, hypertension, and  cigarette smoking (09/2018) seen for follow up.  Last echo in 2008 showed normal LVEF and mild AS. He was doing well on cardiac stand point when last seen by Dr. Acie Fredrickson in 08/2018.  Seen by me 01/14/2019 virtually for follow up. Fluctuating INR. Recommended benefit check with insurance company for NOAC. Reduced toprol XL to 75mg  daily. Advised keep log on vitals.   Here today for follow up. HR now improved to 50s from 40s after reduce dose of Toprol XL. Walking with dogs in woods. The patient denies nausea, vomiting, fever, chest pain, palpitations, shortness of breath, orthopnea, PND, dizziness, syncope, cough, congestion, abdominal pain, hematochezia, melena, lower extremity edema.   Past Medical History:  Diagnosis Date  . Atrial fibrillation ([place])   . Hyperlipidemia   . Hypertension   . Long-term (current) use of anticoagulants   . Tobacco abuse     Past Surgical History:  Procedure Laterality Date  . CARDIAC CATHETERIZATION  01/15/2003   Est. EF is around 65% --  Relatively smooth coronary arteries.  He has a few minor luminal irregularities -- Normal left ventricular systolic function -- Thayer Headings, M.D.  . CARDIOVERSION  08/04/20006   was successful  . COLONOSCOPY W/ POLYPECTOMY  12/02/2003   A moderate-sized rectal polyp at 15 cm -- ohn C. Amedeo Plenty, M.D.  . NOSE SURGERY    . TONSILLECTOMY      Current Medications: Prior to Admission medications   Medication Sig Start Date End Date Taking? Authorizing Provider  amLODipine (NORVASC) 5 MG tablet Take 5 mg by mouth daily.    [provider]   atorvastatin (LIPITOR) 40 MG tablet Take 1 tablet (40 mg total) by mouth daily. 05/10/11   Nahser, Wonda Cheng, MD  Cholecalciferol (VITAMIN D3) 2000 UNITS capsule Take 2,000 Units by mouth daily.     [provider]  donepezil (ARICEPT) 10 MG tablet Take 1 tablet daily 10/26/18   Cameron Sprang, MD  losartan (COZAAR) 100 MG tablet Take 100 mg by mouth daily.    [provider]  metoprolol succinate (TOPROL XL) 25 MG 24 hr tablet Take 3 tablets (75 mg total) by mouth daily. 01/14/19   Leanor Kail, PA  multivitamin (THERAGRAN) per tablet Take 1 tablet by mouth daily. Takes two per week    [provider]  warfarin (JANTOVEN) 5 MG tablet TAKE AS DIRECTED BY COUMADIN CLINIC Patient taking differently: 5 mg. TAKE AS DIRECTED BY COUMADIN CLINIC 09/21/18   Nahser, Wonda Cheng, MD    Allergies:   Lisinopril   Social History   Socioeconomic History  . Marital status: Married    Spouse name: Not on file  . Number of children: Not on file  . Years of education: Not on file  . Highest education level: Not on file  Occupational History  . Not on file  Social Needs  . Financial resource strain: Not on file  . Food insecurity    Worry: Not on file    Inability: Not on file  . Transportation needs    Medical: Not on file    Non-medical: Not  on file  Tobacco Use  . Smoking status: Former Smoker    Packs/day: 0.50    Years: 39.00    Pack years: 19.50    Types: Cigarettes  . Smokeless tobacco: Never Used  Substance and Sexual Activity  . Alcohol use: No  . Drug use: No  . Sexual activity: Not on file  Lifestyle  . Physical activity    Days per week: Not on file    Minutes per session: Not on file  . Stress: Not on file  Relationships  . Social Herbalist on phone: Not on file    Gets together: Not on file    Attends religious service: Not on file    Active member of club or organization: Not on file    Attends meetings of clubs or organizations:  Not on file    Relationship status: Not on file  Other Topics Concern  . Not on file  Social History Narrative   Lives with wife in two story home      Takes frequent walks with dog      Right handed      Highest level of edu- certificate program     Family History:  The patient's family history includes Alzheimer's disease in his mother; Colon cancer in an other family member.   ROS:   Please see the history of present illness.    ROS All other systems reviewed and are negative.   PHYSICAL EXAM:   VS:  BP 132/70   Pulse (!) 51   Ht 6\' 2"  (1.88 m)   Wt 209 lb 12.8 oz (95.2 kg)   SpO2 97%   BMI 26.94 kg/m    GEN: Well nourished, well developed, in no acute distress  HEENT: normal  Neck: no JVD, carotid bruits, or masses Cardiac: irregular; no murmurs, rubs, or gallops,no edema  Respiratory:  clear to auscultation bilaterally, normal work of breathing GI: soft, nontender, nondistended, + BS MS: no deformity or atrophy  Skin: warm and dry, no rash Neuro:  Alert and Oriented x 3, Strength and sensation are intact Psych: euthymic mood, full affect  Wt Readings from Last 3 Encounters:  01/21/19 209 lb 12.8 oz (95.2 kg)  01/14/19 203 lb (92.1 kg)  10/26/18 198 lb 8 oz (90 kg)      Studies/Labs Reviewed:   EKG:  EKG is ordered today.  The ekg ordered today demonstrates atrial fibrillation at rate of 51 bpm Recent Labs: No results found for requested labs within last 8760 hours.   Lipid Panel    Component Value Date/Time   CHOL 129 08/30/2017 1019   TRIG 149 08/30/2017 1019   HDL 46 08/30/2017 1019   CHOLHDL 2.8 08/30/2017 1019   CHOLHDL 3 05/04/2011 1001   VLDL 35.0 05/04/2011 1001   LDLCALC 53 08/30/2017 1019    Additional studies/ records that were reviewed today include:   As summarized above  ASSESSMENT & PLAN:    1. Permanent atrial fibrillation with slow rate -Hr improved to 50s on reduce dose of Toprol-XL to 75 mg daily. Asymptomatic. Will keep  current dose. He will check his vital periodically. On coumadin for anticoagulation. Will review with insurance company for NOAC during next enrollment.   2. Hypertension -BP stable on current medications.   3. tobacco smoking -Congratulated on tobacco cessation    Medication Adjustments/Labs and Tests Ordered: Current medicines are reviewed at length with the patient today.  Concerns regarding medicines are  outlined above.  Medication changes, Labs and Tests ordered today are listed in the Patient Instructions below. Patient Instructions  Medication Instructions:  Your physician recommends that you continue on your current medications as directed. Please refer to the Current Medication list given to you today.  If you need a refill on your cardiac medications before your next appointment, please call your pharmacy.   Lab work: None ordered  If you have labs (blood work) drawn today and your tests are completely normal, you will receive your results only by: Marland Kitchen MyChart Message (if you have MyChart) OR . A paper copy in the mail If you have any lab test that is abnormal or we need to change your treatment, we will call you to review the results.  Testing/Procedures: None ordered  Follow-Up: At [facility], you and your health needs are our priority.  As part of our continuing mission to provide you with exceptional heart care, we have created designated Provider Care Teams.  These Care Teams include your primary Cardiologist (physician) and Advanced Practice Providers (APPs -  Physician Assistants and Nurse Practitioners) who all work together to provide you with the care you need, when you need it. You will need a follow up appointment in:  6 months.  Please call our office 2 months in advance to schedule this appointment.  You may see Mertie Moores, MD or one of the following Advanced Practice Providers on your designated Care Team: Richardson Dopp, PA-C [place], [place] . Daune Perch, NP  Any Other Special Instructions Will Be Listed Below (If Applicable).       John Buckley, [place]  01/21/2019 12:08 PM    [address], [place], [place]  [address] Phone: [phone]; Fax: [phone]

## 2019-01-21 ENCOUNTER — Encounter: Payer: Self-pay | Admitting: Physician Assistant

## 2019-01-21 ENCOUNTER — Ambulatory Visit (INDEPENDENT_AMBULATORY_CARE_PROVIDER_SITE_OTHER): Payer: Medicare Other | Admitting: Physician Assistant

## 2019-01-21 ENCOUNTER — Encounter: Payer: Self-pay | Admitting: *Deleted

## 2019-01-21 ENCOUNTER — Other Ambulatory Visit: Payer: Self-pay

## 2019-01-21 ENCOUNTER — Ambulatory Visit (INDEPENDENT_AMBULATORY_CARE_PROVIDER_SITE_OTHER): Payer: Medicare Other | Admitting: *Deleted

## 2019-01-21 VITALS — BP 132/70 | HR 51 | Ht 74.0 in | Wt 209.8 lb

## 2019-01-21 DIAGNOSIS — I4891 Unspecified atrial fibrillation: Secondary | ICD-10-CM | POA: Diagnosis not present

## 2019-01-21 DIAGNOSIS — I4821 Permanent atrial fibrillation: Secondary | ICD-10-CM

## 2019-01-21 DIAGNOSIS — Z5181 Encounter for therapeutic drug level monitoring: Secondary | ICD-10-CM | POA: Diagnosis not present

## 2019-01-21 DIAGNOSIS — I10 Essential (primary) hypertension: Secondary | ICD-10-CM

## 2019-01-21 LAB — POCT INR: INR: 3 (ref 2.0–3.0)

## 2019-01-21 NOTE — Patient Instructions (Signed)
Description    Continue taking 1 tablet every day except 1/2 tablet on Mondays, Wednesdays, and Fridays.  Recheck INR 4 weeks. Call with any medication changes or procedures [phone]

## 2019-01-21 NOTE — Patient Instructions (Signed)
Medication Instructions:  Your physician recommends that you continue on your current medications as directed. Please refer to the Current Medication list given to you today.  If you need a refill on your cardiac medications before your next appointment, please call your pharmacy.   Lab work: None ordered  If you have labs (blood work) drawn today and your tests are completely normal, you will receive your results only by: . MyChart Message (if you have MyChart) OR . A paper copy in the mail If you have any lab test that is abnormal or we need to change your treatment, we will call you to review the results.  Testing/Procedures: None ordered  Follow-Up: At [facility], you and your health needs are our priority.  As part of our continuing mission to provide you with exceptional heart care, we have created designated Provider Care Teams.  These Care Teams include your primary Cardiologist (physician) and Advanced Practice Providers (APPs -  Physician Assistants and Nurse Practitioners) who all work together to provide you with the care you need, when you need it. You will need a follow up appointment in:  6 months.  Please call our office 2 months in advance to schedule this appointment.  You may see Philip Nahser, MD or one of the following Advanced Practice Providers on your designated Care Team: Scott Weaver, PA-C Vin Bhagat, PA-C . Janine Hammond, NP  Any Other Special Instructions Will Be Listed Below (If Applicable).    

## 2019-02-01 DIAGNOSIS — E7800 Pure hypercholesterolemia, unspecified: Secondary | ICD-10-CM | POA: Diagnosis not present

## 2019-02-01 DIAGNOSIS — I10 Essential (primary) hypertension: Secondary | ICD-10-CM | POA: Diagnosis not present

## 2019-02-01 DIAGNOSIS — I482 Chronic atrial fibrillation, unspecified: Secondary | ICD-10-CM | POA: Diagnosis not present

## 2019-02-18 ENCOUNTER — Other Ambulatory Visit: Payer: Self-pay

## 2019-02-18 ENCOUNTER — Ambulatory Visit (INDEPENDENT_AMBULATORY_CARE_PROVIDER_SITE_OTHER): Payer: Medicare Other | Admitting: Pharmacist

## 2019-02-18 DIAGNOSIS — Z5181 Encounter for therapeutic drug level monitoring: Secondary | ICD-10-CM

## 2019-02-18 DIAGNOSIS — I4891 Unspecified atrial fibrillation: Secondary | ICD-10-CM | POA: Diagnosis not present

## 2019-02-18 LAB — POCT INR: INR: 2.3 (ref 2.0–3.0)

## 2019-02-18 NOTE — Patient Instructions (Signed)
Description    Continue taking 1 tablet every day except 1/2 tablet on Mondays, Wednesdays, and Fridays.  Recheck INR 4 weeks. Call with any medication changes or procedures [phone]

## 2019-02-28 DIAGNOSIS — D1801 Hemangioma of skin and subcutaneous tissue: Secondary | ICD-10-CM | POA: Diagnosis not present

## 2019-02-28 DIAGNOSIS — D229 Melanocytic nevi, unspecified: Secondary | ICD-10-CM | POA: Diagnosis not present

## 2019-02-28 DIAGNOSIS — Z85828 Personal history of other malignant neoplasm of skin: Secondary | ICD-10-CM | POA: Diagnosis not present

## 2019-02-28 DIAGNOSIS — B351 Tinea unguium: Secondary | ICD-10-CM | POA: Diagnosis not present

## 2019-02-28 DIAGNOSIS — L728 Other follicular cysts of the skin and subcutaneous tissue: Secondary | ICD-10-CM | POA: Diagnosis not present

## 2019-02-28 DIAGNOSIS — L821 Other seborrheic keratosis: Secondary | ICD-10-CM | POA: Diagnosis not present

## 2019-02-28 DIAGNOSIS — L814 Other melanin hyperpigmentation: Secondary | ICD-10-CM | POA: Diagnosis not present

## 2019-02-28 DIAGNOSIS — L819 Disorder of pigmentation, unspecified: Secondary | ICD-10-CM | POA: Diagnosis not present

## 2019-04-02 ENCOUNTER — Other Ambulatory Visit: Payer: Self-pay

## 2019-04-02 ENCOUNTER — Ambulatory Visit (INDEPENDENT_AMBULATORY_CARE_PROVIDER_SITE_OTHER): Payer: Medicare Other | Admitting: *Deleted

## 2019-04-02 DIAGNOSIS — Z5181 Encounter for therapeutic drug level monitoring: Secondary | ICD-10-CM | POA: Diagnosis not present

## 2019-04-02 DIAGNOSIS — I4891 Unspecified atrial fibrillation: Secondary | ICD-10-CM

## 2019-04-02 LAB — POCT INR: INR: 3.8 — AB (ref 2.0–3.0)

## 2019-04-02 NOTE — Patient Instructions (Signed)
Description   Do not take any Warfarin today then continue taking 1 tablet every day except 1/2 tablet on Mondays, Wednesdays, and Fridays. Recheck INR 4 weeks. Call with any medication changes or procedures [phone]

## 2019-04-15 ENCOUNTER — Other Ambulatory Visit: Payer: Self-pay | Admitting: Cardiovascular Disease

## 2019-04-30 ENCOUNTER — Other Ambulatory Visit: Payer: Self-pay

## 2019-04-30 ENCOUNTER — Ambulatory Visit (INDEPENDENT_AMBULATORY_CARE_PROVIDER_SITE_OTHER): Payer: Medicare Other | Admitting: *Deleted

## 2019-04-30 DIAGNOSIS — I4891 Unspecified atrial fibrillation: Secondary | ICD-10-CM | POA: Diagnosis not present

## 2019-04-30 DIAGNOSIS — Z5181 Encounter for therapeutic drug level monitoring: Secondary | ICD-10-CM

## 2019-04-30 LAB — POCT INR: INR: 3.7 — AB (ref 2.0–3.0)

## 2019-04-30 NOTE — Patient Instructions (Signed)
Description   Do not take any Warfarin today then start taking 1/2 tablet daily except 1 tablet on Sundays, Tuesdays, and Thursdays. Recheck INR 3 weeks. Call with any medication changes or procedures [phone]

## 2019-05-21 ENCOUNTER — Other Ambulatory Visit: Payer: Self-pay

## 2019-05-21 ENCOUNTER — Ambulatory Visit (INDEPENDENT_AMBULATORY_CARE_PROVIDER_SITE_OTHER): Payer: Medicare Other | Admitting: *Deleted

## 2019-05-21 DIAGNOSIS — Z5181 Encounter for therapeutic drug level monitoring: Secondary | ICD-10-CM

## 2019-05-21 DIAGNOSIS — I4891 Unspecified atrial fibrillation: Secondary | ICD-10-CM

## 2019-05-21 LAB — POCT INR: INR: 2.5 (ref 2.0–3.0)

## 2019-05-21 NOTE — Patient Instructions (Signed)
Description   Continue taking 1/2 tablet daily except 1 tablet on Sundays, Tuesdays, and Thursdays. Recheck INR 4 weeks. Call with any medication changes or procedures [phone]

## 2019-05-28 ENCOUNTER — Encounter: Payer: Self-pay | Admitting: Neurology

## 2019-05-29 ENCOUNTER — Encounter: Payer: Self-pay | Admitting: Neurology

## 2019-05-29 ENCOUNTER — Telehealth (INDEPENDENT_AMBULATORY_CARE_PROVIDER_SITE_OTHER): Payer: Medicare Other | Admitting: Neurology

## 2019-05-29 ENCOUNTER — Other Ambulatory Visit: Payer: Self-pay

## 2019-05-29 VITALS — Ht 74.0 in | Wt 210.0 lb

## 2019-05-29 DIAGNOSIS — G3184 Mild cognitive impairment, so stated: Secondary | ICD-10-CM

## 2019-05-29 MED ORDER — MEMANTINE HCL 10 MG PO TABS: ORAL_TABLET | ORAL | 5 refills | Status: DC

## 2019-05-29 MED ORDER — DONEPEZIL HCL 10 MG PO TABS: ORAL_TABLET | ORAL | 3 refills | Status: DC

## 2019-05-29 NOTE — Progress Notes (Signed)
Virtual Visit via Video Note The purpose of this virtual visit is to provide medical care while limiting exposure to the novel coronavirus.    Consent was obtained for video visit:  Yes.   Answered questions that patient had about telehealth interaction:  Yes.   I discussed the limitations, risks, security and privacy concerns of performing an evaluation and management service by telemedicine. I also discussed with the patient that there may be a patient responsible charge related to this service. The patient expressed understanding and agreed to proceed.  Pt location: Home Physician Location: office Name of referring provider:  Lavone Orn, MD I connected with John Buckley at patients initiation/request on 05/29/2019 at  8:30 AM EST by video enabled telemedicine application and verified that I am speaking with the correct person using two identifiers. Pt MRN:  [id number] Pt DOB:  [birth year] Video Participants:  John Buckley;  [place] (spouse)   History of Present Illness:  The patient was seen as a virtual video visit on 05/29/2019. He was last seen 7 months ago for memory loss. His wife is present during the e-visit to provide additional information. [place] 17/30 in July 2020. Since his last visit, his wife reports a decline. 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 He says he does not trust anything on the internet. His wife reports even the remote control is challenging. His wife will be doing taxes this year because he does not remember all the things they need to do the taxes and he is not comfortable on the computer. His wife has noticed trouble with word association, she would say a word and he would not know what it is, asking more questions, not quite understanding. He also has word-finding difficulties. He denies getting lost driving, as long as you don't ask him the name  of the street. He manages his own medications without issue. They report that he has had some difficulties getting his Coumadin regulated since changing on of his BP medications. He stopped smoking. Sleep is good. He denies any headaches, dizziness, focal numbness/tingling/weakness, no falls. He is on Donepezil 10mg  daily without side effects. He is noted to be more irritable with his wife's reports of changes, his wife denies any hallucinations.  History on Initial Assessment 08/28/2017: This is a pleasant 74 year old right-handed man with a history of hypertension, hyperlipidemia, atrial fibrillation, tobacco use, presenting for evaluation of worsening memory. He feels his memory is "not worth a hoot." He and his wife started noticing changes a little over a year ago. He feels that it seemed to start all of a sudden, he would be having a conversation and could not think of a word in the middle of his sentence. It takes a minute or two to come back. He is misplacing things frequently at home. He continues to drive without getting lost. Most of the bills are on draft, he denies missing bills. He has an alarm for his medications and rarely misses them. His wife first noticed changes when they were writing Christmas cards together, he was having trouble spelling addresses copying them from their address book. This year they decided to use labels. He has more difficulty concentrating and following programs on TV as easily as he used to. They were driving in [place] last August, which they do every year, but she noticed a change  in behavior which was just out of character. They are quite familiar with the city but he became really anxious bout where to turn and what to do. He reports his wife was navigating with her phone and they were using a rental car, which stressed him out. Otherwise at home, she has not noticed similar significant anxiety. He has always had a quick temper and is a little more irritable/cranky  now. No paranoia or hallucinations. He is able to bathe and dress independently.   He has brief unsteadiness when he lays on his right side and gets up, with associated nausea. He denies any headaches, diplopia, dysarthria/dysphagia, neck/back pain, focal numbness/tingling/weakness, bladder dysfunction, tremor. He has frequent bowel movements. His sense of smell has been affected by his smoking. His mother and maternal aunt had dementia. He denies any history of significant head injuries. He drinks alcohol occasionally.   Laboratory Data: 11/2016 TSH and B12 normal at PCP office. MRI brain without contrast done 08/2017 which did not show any acute changes. There was moderate diffuse atrophy, mild chronic microvascular disease.   Current Outpatient Medications on File Prior to Visit  Medication Sig Dispense Refill  . amLODipine (NORVASC) 5 MG tablet Take 5 mg by mouth daily.    Marland Kitchen atorvastatin (LIPITOR) 40 MG tablet Take 1 tablet (40 mg total) by mouth daily. 90 tablet 3  . Cholecalciferol (VITAMIN D3) 2000 UNITS capsule Take 2,000 Units by mouth daily.     Marland Kitchen donepezil (ARICEPT) 10 MG tablet Take 1 tablet daily 90 tablet 3  . losartan (COZAAR) 100 MG tablet Take 100 mg by mouth daily.    . metoprolol succinate (TOPROL XL) 25 MG 24 hr tablet Take 3 tablets (75 mg total) by mouth daily. 90 tablet 6  . multivitamin (THERAGRAN) per tablet Take 1 tablet by mouth daily. Takes two per week    . warfarin (JANTOVEN) 5 MG tablet TAKE AS DIRECTED BY        COUMADIN CLINIC 90 tablet 1   No current facility-administered medications on file prior to visit.     Observations/Objective:   Vitals:   05/28/19 1549  Weight: 210 lb (95.3 kg)  Height: 6\' 2"  (1.88 m)   GEN:  The patient appears stated age and is in NAD.  Neurological examination: Patient is awake, alert, oriented x 3. No aphasia or dysarthria. Decreased fluency, intact comprehension. Recent memory intact. Able to name and repeat. Cranial  nerves: Extraocular movements intact with no nystagmus. No facial asymmetry. Motor: moves all extremities symmetrically, at least anti-gravity x 4. No incoordination on finger to nose testing. Gait: narrow-based and steady.   Assessment and Plan:   This is a pleasant 74 yo RH man with a history of hypertension, hyperlipidemia, atrial fibrillation, tobacco use, with worsening memory. MOCA score 17/30 in July 2020 (25/30 in 08/2017). He is on Donepezil 10mg  daily without side effects. His wife is reporting more difficulties with complex tasks, he feels he is doing fine. We discussed Neurocognitive testing to further evaluate cognitive complaints. We discussed adding on Memantine, discussed side effects and expectations. Start Memantine 10mg  1 tab qhs x 2 weeks, then increase to 1 tab BID. Continue to monitor mood, he is noted to be a little more irritated than prior visits. Follow-up in 6 months, they know to call for any changes.    Follow Up Instructions:    -I discussed the assessment and treatment plan with the patient. The patient was provided an opportunity to ask  questions and all were answered. The patient agreed with the plan and demonstrated an understanding of the instructions.   The patient was advised to call back or seek an in-person evaluation if the symptoms worsen or if the condition fails to improve as anticipated.    Cameron Sprang, MD

## 2019-06-19 DIAGNOSIS — I482 Chronic atrial fibrillation, unspecified: Secondary | ICD-10-CM | POA: Diagnosis not present

## 2019-06-19 DIAGNOSIS — G3184 Mild cognitive impairment, so stated: Secondary | ICD-10-CM | POA: Diagnosis not present

## 2019-06-19 DIAGNOSIS — Z720 Tobacco use: Secondary | ICD-10-CM | POA: Diagnosis not present

## 2019-06-19 DIAGNOSIS — I10 Essential (primary) hypertension: Secondary | ICD-10-CM | POA: Diagnosis not present

## 2019-06-20 ENCOUNTER — Ambulatory Visit: Payer: Medicare Other | Attending: Internal Medicine

## 2019-06-20 DIAGNOSIS — Z23 Encounter for immunization: Secondary | ICD-10-CM | POA: Insufficient documentation

## 2019-06-20 NOTE — Progress Notes (Signed)
   Covid-19 Vaccination Clinic  Name:  ZAYN CLEMENCE    MRN: [id number] DOB: December 30, 1945  06/20/2019  Mr. Wrigley was observed post Covid-19 immunization for 15 minutes without incident. He was provided with Vaccine Information Sheet and instruction to access the V-Safe system.   Mr. Langridge was instructed to call 911 with any severe reactions post vaccine: Marland Kitchen Difficulty breathing  . Swelling of face and throat  . A fast heartbeat  . A bad rash all over body  . Dizziness and weakness

## 2019-06-21 ENCOUNTER — Ambulatory Visit (INDEPENDENT_AMBULATORY_CARE_PROVIDER_SITE_OTHER): Payer: Medicare Other | Admitting: *Deleted

## 2019-06-21 ENCOUNTER — Other Ambulatory Visit: Payer: Self-pay

## 2019-06-21 DIAGNOSIS — Z5181 Encounter for therapeutic drug level monitoring: Secondary | ICD-10-CM

## 2019-06-21 DIAGNOSIS — I4891 Unspecified atrial fibrillation: Secondary | ICD-10-CM | POA: Diagnosis not present

## 2019-06-21 LAB — POCT INR: INR: 2.9 (ref 2.0–3.0)

## 2019-06-21 NOTE — Patient Instructions (Signed)
Description   Continue taking 1/2 tablet daily except 1 tablet on Sundays, Tuesdays, and Thursdays. Recheck INR 5 weeks. Call with any medication changes or procedures [phone]

## 2019-06-24 ENCOUNTER — Encounter: Payer: Self-pay | Admitting: Counselor

## 2019-06-24 ENCOUNTER — Other Ambulatory Visit: Payer: Self-pay

## 2019-06-24 ENCOUNTER — Ambulatory Visit: Payer: Medicare Other | Admitting: Psychology

## 2019-06-24 ENCOUNTER — Ambulatory Visit (INDEPENDENT_AMBULATORY_CARE_PROVIDER_SITE_OTHER): Payer: Medicare Other | Admitting: Counselor

## 2019-06-24 DIAGNOSIS — G3101 Pick's disease: Secondary | ICD-10-CM

## 2019-06-24 DIAGNOSIS — F0280 Dementia in other diseases classified elsewhere without behavioral disturbance: Secondary | ICD-10-CM | POA: Diagnosis not present

## 2019-06-24 DIAGNOSIS — F0390 Unspecified dementia without behavioral disturbance: Secondary | ICD-10-CM

## 2019-06-24 DIAGNOSIS — F09 Unspecified mental disorder due to known physiological condition: Secondary | ICD-10-CM

## 2019-06-24 DIAGNOSIS — F03A0 Unspecified dementia, mild, without behavioral disturbance, psychotic disturbance, mood disturbance, and anxiety: Secondary | ICD-10-CM

## 2019-06-24 NOTE — Progress Notes (Signed)
[facility]  Patient Name: John Buckley MRN: [id number] Date of Birth: 1946/04/13 Age: 74 y.o. Education: 14 years  Referral Circumstances and Background Information  [name] is a 74 y.o., right-hand dominant, married man with a history of thinking and memory problems since the Summer, 2018. His wife first noticed his issues when in [place], he seemed anxious driving and was "PO'ed" and that's not like him. He has been following with Dr. Delice Lesch since then and carries a diagnosis of MCI, although I see that at the last appointment on 05/29/2019, his wife was reporting some significant decline and he was starting to have difficulties with more complex things such as doing taxes and using electronics including the TV remote (he worked in the [employer] for 50 years). On interview, the patient is aware that he has "all kinda problems with words" but was vague and hard a hard time describing them. His wife stated that she will say words and he doesn't know what they are, for instance she will tell him to put something in the microwave or the refridgerator and he doesn't recall what the microwave or refrigerator are. He has also forgotten what a sofa is. He does have problems with comprehension, when he is reading e-mails he will frequently say that he doesn't understand them. He has frequent word finding problems. His wife wasn't sure if he has problems making speech sounds, but she did note that sometimes she will ask him if he has his partial in because words don't sound right. His wife stated that he doesn't actually have prominent forgetting, repeating himself, and he doesn't forget entire events. He does have day-to-day forgetfulness. He has a quick temper and has always been like that but it sounds like there are no major changes. He has diminished interest and motivation and used to be very involved with computers (built computers for 30 years)  but now he has no interest. He told a rambling story regarding the internet and how it is the [place] now and his wife said he has been fixated on that as of late. He also has significant apathy and spends much of his time watching TV. He used to be more active. He reported that his mood is good and his wife described him as generally upbeat. His energy is good, although as above his activity level is low. His wife stated that his tastes have changed, since he stopped smoking, but there are no odd new food cravings. His weight is stable for him. He is sleeping well, typically around 8 hours a night. They denied any inappropriate behaviors or using rough language in public.   With respect to functioning, the patient is still driving and his wife stated that she isn't worried about his driving. They have most of their bills on auto pay and he does monitor and keep track of many of those.  He does have to split the phone bill between multiple family members and does adequately with that. He is still able to use a computer but doesn't as much as in the past. He did decide to give up the taxes and had his wife do them this year. He isn't as handy with things around the house as he used to be, he called their daughter to install blinds and he would have done that in the past. He can't start his lawnmower and his wife doesn't think he knows what to do with it and they  are going to call someone to fix it. He also doesn't do quite as many chores as he used to. They schedule medical appointments together and use a calendar to track them and he is generally fairly aware of it. He doesn't have significant difficulties with orientation and doesn't lose the month or the year.   Past Medical History and Review of Relevant Studies   Patient Active Problem List   Diagnosis Date Noted  . Encounter for therapeutic drug monitoring 01/10/2017  . Obesity 11/05/2010  . HTN (hypertension) 11/05/2010  . Hyperlipidemia  11/05/2010  . Atrial fibrillation ([place]) 07/13/2010    Review of Neuroimaging and Relevant Studies:  Mr. Stoops has an MRI brain 09/05/2017 that shows a significant and likely pathological degree of moderate cortical atrophy with some slight asymmetry (L>R). The pattern appears to be fairly generalized. There is a minimal burden of leukoaraiosis and there is also mineralization of the bilateral basal ganglia.   Current Outpatient Medications  Medication Sig Dispense Refill  . amLODipine (NORVASC) 5 MG tablet Take 5 mg by mouth daily.    Marland Kitchen atorvastatin (LIPITOR) 40 MG tablet Take 1 tablet (40 mg total) by mouth daily. 90 tablet 3  . Cholecalciferol (VITAMIN D3) 2000 UNITS capsule Take 2,000 Units by mouth daily.     Marland Kitchen donepezil (ARICEPT) 10 MG tablet Take 1 tablet daily 90 tablet 3  . losartan (COZAAR) 100 MG tablet Take 100 mg by mouth daily.    . memantine (NAMENDA) 10 MG tablet Take 1 tablet every night for 2 weeks, then increase to 1 tablet twice a day 60 tablet 5  . metoprolol succinate (TOPROL XL) 25 MG 24 hr tablet Take 3 tablets (75 mg total) by mouth daily. 90 tablet 6  . multivitamin (THERAGRAN) per tablet Take 1 tablet by mouth daily. Takes two per week    . warfarin (JANTOVEN) 5 MG tablet TAKE AS DIRECTED BY        [facility] 90 tablet 1   No current facility-administered medications for this visit.    Family History  Problem Relation Age of Onset  . Alzheimer's disease Mother   . Colon cancer Other        family history of     There is a family history of dementia. The patient's mother developed the condition. He also has a maternal aunt who developed the condition. There is no  family history of psychiatric illness.  Psychosocial History  Developmental, Educational and Employment History: The patient is a native of [place] and grew up near [place]. The patient stated that he did adequately in school although he wasn't particularly focused on his studies and was  more interested in sports. He was never held back though and didn't have any learning difficulties. He joined the [employer] after that and served for 8 years. 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 Prior to that he worked at [employer]. He retired about 13 years.   Psychiatric History: The patient has no significant history of mental health involvement or consultation.   Substance Use History: The patient doesn't drink or use any drugs.   Relationship History and Living Cimcumstances: The patient has been married for 52 years. He has two daughters, one of whom lives in the area and the other lives in [place]. They have three grandchildren. His daughters have noticed the cognitive changes and are aware.   Mental Status  and Behavioral Observations  Sensorium/Arousal: The patient's level of arousal was awake and alert.  Orientation: The patient was fully oriented to person, place, date, and his orientation to time was unclear (he said 2012 but seemed to know that was wrong, making me question if he wasn't switching the 2 and the 1 in 2021). He was not aware of the current president but was aware of the past two presidents and knew "it's not trump!" Appearance: Dressed in appropriate casual clothing Behavior: The patient presented as somewhat disinhibited and disorganized in that he often interrupted his wife, interrupted the examiner, and went on long tangents when responding to questions that were not pertinent.  Speech/language: Normal in rate, rhythm, volume, and prosody with fluent speech. He did have occasional word finding errors in spontaneous speech.  Gait/Posture: Appeared normal on casual observation of ambulation in the clinic.  Movement: No overt signs of Parkinsonism or other movement abnormalities on observation.  Social Comportment: The patient presented as somewhat disinhibited as above, although his wife  stated he was just being "defensive" and does not think he is very different than in the past so that may just be a behavioral tendency.  Mood: "Good!" Affect: Mainly euthymic Thought process/content: The patient's thought process was circumstantial bordering to tangential at times although with redirection, he was able to supply some of his own history. His thought content was generally appropriate.  Safety: No safety concerns, thoughts of harming self or others identified at today's encounter.  Insight: Unclear  Montreal Cognitive Assessment  10/26/2018 08/28/2017  Visuospatial/ Executive (0/5) 4 4  Naming (0/3) 3 3  Attention: Read list of digits (0/2) 1 2  Attention: Read list of letters (0/1) 1 1  Attention: Serial 7 subtraction starting at 100 (0/3) 1 3  Language: Repeat phrase (0/2) 1 2  Language : Fluency (0/1) 0 0  Abstraction (0/2) 0 2  Delayed Recall (0/5) 0 2  Orientation (0/6) 6 6  Total 17 25    Test Procedures  Wide Range Achievement Test - 4 Word Reading Wechsler Adult Intelligence Scale - IV  Digit Span  Arithmetic  Symbol Search  Coding  Information  Block Design Neuropsychological Assessment Battery  List Learning  Story Learning  Daily Living Memory  Naming Repeatable Battery for the Assessment of Neuropsychological Status (Form A) A Random Letter Test The Dot Counting Test A Random Letter Test Controlled Oral Word Association (F-A-S) Semantic Fluency (Animals) Trail Making Test A & B Modified Wisconsin Card Sorting Test Geriatric Depression Scale - Short Form Quick Dementia Rating System  Plan  PARTH KOLAR was seen for a psychiatric diagnostic evaluation and neuropsychological testing. This gentleman has an interesting presentation with less in the way of memory loss and more in the way of language problems than might be expected in a typical presentation of Alzheimer's disease, but his language problems seem more semantic than logopenic and it  is not clear that he would fulfill criteria for PPA given that language problems are not clearly the principle cause of his day-to-day impairment. Full and complete note with impressions, recommendations, and interpretation of test data to follow.   Viviano Simas Nicole Kindred, PsyD, [place] Clinical Neuropsychologist  Informed Consent and Coding/Compliance  Risks and benefits of the evaluation were discussed with the patient as were the limits of confidentiality. I conducted a clinical interview and neuropsychological testing (more than two tests) with Donnamarie Poag and Milana Kidney, B.S. (Technician) assisted me in administering additional test procedures.  The patient was able to tolerate the testing procedures and the patient (and/or family if applicable) is likely to benefit from further follow up to receive the diagnosis and treatment recommendations, which will be rendered at the next encounter. Billing below reflects technician time, my direct face-to-face time with the patient, time spent in test administration, and time spent in professional activities including but not limited to: neuropsychological test interpretation, integration of neuropsychological test data with clinical history, report preparation, treatment planning, care coordination, and review of diagnostically pertinent medical history or studies.   Services associated with this encounter: Clinical Interview [phone]) plus 60 minutes [id number]; Neuropsychological Evaluation by Professional)  120 minutes [id number]; Neuropsychological Evaluation by Professional, Adl.) 30 minutes [id number]; Test Administration by Professional) 30 minutes [id number]; Test Administration by Professional, Adl.) 30 minutes [id number]; Neuropsychological Testing by Technician) 95 minutes [id number]; Neuropsychological Testing by Technician, Adl.)

## 2019-06-24 NOTE — Progress Notes (Signed)
   Psychometrist Note   Cognitive testing was administered to [employer] by Milana Kidney, B.S. (Technician) (psychometrist) under the supervision of Alphonzo Severance, Psy.D., ABN. Mr. Sandoz was able to tolerate all test procedures. Dr. Nicole Kindred met with the patient as needed to manage any emotional reactions to the testing procedures (if applicable). Rest breaks were offered.    The battery of tests administered was selected by Dr. Nicole Kindred with consideration to the patient's current level of functioning, the nature of his symptoms, emotional and behavioral responses during the interview, level of literacy, observed level of motivation/effort, and the nature of the referral question. This battery was communicated to the psychometrist. Communication between Dr. Nicole Kindred and the psychometrist was ongoing throughout the evaluation and Dr. Nicole Kindred was immediately accessible at all times. Dr. Nicole Kindred provided supervision to the technician on the date of this service, to the extent necessary to assure the quality of all services provided.    Mr. Gustafson will return in approximately one week for an interactive feedback session with Dr. Nicole Kindred, at which time male test performance, clinical impressions, and treatment recommendations will be reviewed in detail. The patient understands he can contact our office should he require our assistance before this time.   A total of 125 minutes of billable time were spent with Donnamarie Poag by the technician, including test administration and scoring time. Billing for these services is reflected in Dr. Les Pou note.   This note reflects time spent with the psychometrician and does not include test scores, clinical history, or any interpretations made by Dr. Nicole Kindred. The full report will follow in a separate note.

## 2019-06-26 NOTE — Progress Notes (Signed)
[facility]  Patient Name: John Buckley MRN: [id number] Date of Birth: 1945/08/01 Age: 74 y.o. Education: 14 years  Measurement properties of test scores: IQ, Index, and Standard Scores (SS): Mean = 100; Standard Deviation = 15 Scaled Scores (Ss): Mean = 10; Standard Deviation = 3 Z scores (Z): Mean = 0; Standard Deviation = 1 T scores (T); Mean = 50; Standard Deviation = 10  TEST SCORES:    Note: This summary of test scores accompanies the interpretive report and should not be considered in isolation without reference to the appropriate sections in the text. Test scores are relative to age, gender, and educational history as available and appropriate.   Performance Validity        A Random Letter Test Raw  Descriptor      Errors 1 Within Expectation  The Dot Counting Test: 19 Within Expectation      Mental Status Screening     Total Score Classification  MoCA 14 Mild/Mod Dementia      Expected Functioning        Wide Range Achievement Test: Standard/Scaled Score Percentile      Word Reading 88 21      Attention/Processing Speed        Neuropsychological Assessment Battery (Attention Module, Form 1): T-score Percentile      Digits Forward 45 31      Digits Backwards 32 4      Repeatable Battery for the Assessment of Neuropsychological Status (Form A): Scaled Score Percentile      Coding 3 1      Language        Neuropsychological Assessment Battery (Language Module, Form 1): T-score Percentile      Naming 19 <1      Verbal Fluency: T-score Percentile      Controlled Oral Word Association (F-A-S) 20 <1      Semantic Fluency (Animals) 21 <1      NACC UDS 3  T-score Percentile      Sentence Repetition 45 31      Semantic Word-picture Matching 20 <1      Memory:        Neuropsychological Assessment Battery (Memory Module, Form 1): T-score Percentile      List Learning           List A Immediate Recall   (1, 2, 3) 19  <1         List B Immediate Recall   (2) 41 18         List A Short Delayed Recall   (1) 30 2         List A Long Delayed Recall   (1) 34 5         List A Long Delayed Yes/No Recognition Hits   (11) -- 58         List A Long Delayed Yes/No Recognition False Alarms   (11) -- 3         List A Recognition Discriminability Index -- 2     Story Learning           Immediate Recall   (0, 0) 20 <1         Delayed Recall   (0) 34 5      Daily Living Memory            Immediate Recall   (9, 8) 25 1          Delayed  Recall   (4, 1) 28 2          Recognition Hits -- 2      Repeatable Battery for the Assessment of Neuropsychological Status (Form A): Scaled Score Percentile         Figure Recall   (11) 9 37      Visuospatial/Constructional Functioning        Repeatable Battery for the Assessment of Neuropsychological Status (Form A): Standard/Scaled Score Percentile     Visuospatial/Constructional Index 105 63         Figure Copy 9 37         Judgment of Line Orientation --- >75      Executive Functioning        Modified Wisconsin Card Sorting Test (MWCST): Standard/T-Score Percentile      Number of Categories Correct 20 1      Number of Perseverative Errors 30 3      Number of Total Errors 30 3      Percent Perseverative Errors 41 19  Executive Function Composite 59 1      Trail Making Test: T-Score Percentile      Part A 41 18      Part B 38 12      Boston Diagnostic Aphasia Exam: Raw Score Scaled Score      Complex Ideational Material 9 5      Clock Drawing Raw Score Descriptor      Command 5 Moderate Imp.      Rating Scales        Quick Dementia Rating System Raw Score Descriptor      Sum of Boxes 4 Very Mild Dementia      Total Score 7 Mild Dementia  Geriatric Depression Scale - Short Form 3 Negative   Peter V. Nicole Kindred PsyD, [place] Clinical Neuropsychologist

## 2019-06-27 NOTE — Progress Notes (Signed)
[facility]  Patient Name: John Buckley MRN: [id number] Date of Birth: 04-28-1945 Age: 74 y.o. Education: 58 years  Clinical Impressions  John Buckley is a 74 y.o., right-hand dominant, married man with a history of memory and thinking problems since Summer, 2018. He is having language problems including difficulties with single word comprehension and/or object knowledge (did not understand what a refrigerator, microwave, or sofa were). 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 He has no major personality changes although he can be irritable, he has apathy/diminished interest, and he seems somewhat fixated on the idea that computers are no good anymore because of scams. Within the clinical encounter, he presented as perhaps a bit disinhibited, often interrupting to tell rambling stories. His MRI shows a moderate, likely pathological degree of atrophy that is fairly generalized with some slight asymmetry (L>R).   There is evidence from neuropsychological testing of changes with respect to language function with markedly deficient visual object confrontation naming and evidence of single word comprehension problems. John Buckley also had significant difficulties with measures of executive function, working memory (with dyscalculia), and speed of processing. He generated low scores on verbally mediated memory measures but I believe that is secondary to his language processing problems as his delayed recall of visual information was good. He was characterized as functioning at a very mild to mild dementia level by his wife and screened negative for the presence of depression.   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 It's not entirely clear that he would fulfill formal criteria for PPA given his wife's history (i.e., language problems not are necessarily the principle cause of day-to-day impairment). He most closely resembles a semantic phenotype given his single word comprehension difficulties, profound naming problems, and relatively better preserved comprehension and fluency.  He may have deficient object knowledge but that was not well demonstrated within this evaluation. He does not clearly have the characteristic atrophy pattern often encountered in that condition. Pathologically, the differential would include an atypical presentation of Alzheimer's disease, TDP-43 type C pathology (less likely) or Tau pathology (less likely). Atypical logopenic PPA (most often caused by Alzheimer's disease) is another syndromic consideration, although that is typically not associated with single word comprehension difficulties and his repetition was intact.   Diagnostic Impressions: Mild dementia Semantic aphasia  Recommendations to be discussed with patient  Your performance and presentation on assessment were consistent with cognitive changes, particularly involving language and executive function (e.g., ability to think flexibly, inhibit behavior, and shift from one thing to another). You also had memory problems although only when remembering verbal material, and thus I think your "memory problems" may in large part be secondary to difficulties processing verbal information and executive function problems. Specifically, you had difficulties with single-word comprehension, both on testing and as reported by your wife. That is a rare problem and in conjunction with all your other test data suggests that you most closely resemble a "semantic dementia" syndrome although I'm not sure you meet full criteria.   Dementia refers to a group of syndromes where multiple areas of ability are damaged in the brain, such as  memory, thinking, judgment, and behavior, and most commonly refer to age related causes of dementia that cause  worsening in these abilities over time. Alzheimer's disease is the most common form of dementia in people over the age of 55. Not all dementias are Alzheimer's disease, but all Alzheimer's disease is dementia. When dementia is due to an underlying condition affecting the brain, such as Alzheimer's disease, there is progression over time, which typically procedes gradually over many years.   The specific type of problem you are having can be caused by several different dementia conditions, most notably Alzheimer's disease and a related condition in the frontotemporal dementia spectrum. In your case, I do not think further workup is necessary because it would not change your prognosis as I am confident this is a neurodegenerative problem and will worsen over time. You are also on anticoagulation, which would make you high risk for a lumbar puncture, the best next step in your diagnostic workup.   Due to significant difficulties with comprehension, avoid assuming that failure to comply with instructions or respond appropriately is due to inattentiveness or willfulness. Be patient, and attempt to explain what you mean in clear, concise language. Use gestures or other aids to help get the point across. Check understanding by asking him to paraphrase or say what you mean in his own words.   Apathy is a common problem in a number of conditions common in older individuals, including dementia and depression. Apathy robs individuals of their motivation to engage in activities that they once enjoyed and can contribute to a sedentary lifestyle. Often, gentle encouragement (e.g., encouraging your loved one to go on a walk with you and taking the first step) can be extremely helpful. Attempt to motivate through leading by example, providing encouragement, and reinforcing desired behavior. If that is ineffective be  persistent but don't be insistent or demanding, which can provoke agitation. Once routines are established they can be hard to change, so do your best to help your loved one maintain an active and healthy routine that includes exercise, meaningful interactions with others, and quality time with each other.   As with all capable adults, I would recommend that you consult with an elder care attorney regarding advanced directives if you have not, such as a healthcare power of attorney and living will. Consultation with an elder care attorney can help you protect your estate and communicate your preferences to your loved ones formally, in the event you are not able to do so yourself. I can provide my strong recommendation for the [employer], who are located at [address] in [place], [place]. They can be reached at [phone]. They also have a website [email or web address].   Return for re-evaluation in one to two years. We now have test data to serve as a strong baseline in the future and can monitor your progress over time.   Test Findings  Test scores are summarized in additional documentation associated with this encounter. Test scores are relative to age, gender, and educational history as available and appropriate. There were no concerns about performance validity as all findings fell within normal expectations.   General Intellectual Functioning/Achievement:  Performance on single word reading fell at a low average level, although it is possible that represents some slight decline given the extent of Mr. Market' language problems. Average to low average present as reasonable standards of comparison for Mr. Bobo' test performance given his level of occupational and academic achievement.   Attention and Processing Efficiency: Performance on indciators of attention was within normal limits but he had a  very hard time on working memory measures demonstrating an unusually low  score on digit repetition backward. He has notable dyscalculia with 1/5 serial subtractions of 7 from 100 despite taking nearly a minute to do the task.   With respect to processing efficiency, timed number-symbol coding was extremely low. Performance was better and fell at a low average level on simple numeric sequencing.   Language: Language findings demonstrated profoundly impaired visual object confrontation naming, single word comprehension deficits, and relatively better qualitative verbal fluency and repetition. Naming was extremely low psychometrically with only 7/31 items correct. On the semantic word picture matching test, Mr. Grosz scored at an unusually low level, which is in line with his wife's report of him failing to comprehend what "sofa," "microwave", or "couch" mean. Unclear if he has accompanying deficient object knowledge. Timed indicators of verbal fluency was extremely low although those also have semantic and executive components. Qualitatively, his speech was fluent and not slow/effortful. Repetition was average on the [facility] UDS3 sentence repetition test.   Visuospatial Function: Performance on visuospatial and constructional measures was well preserved albeit with some planning problems that may have undermined his score on figure copy tasks. This was evident on the Necker cube from the [facility] and figure copy on the RBANS. His overall performance level was average. Judgment of angular line orientations was high average.   Learning and Memory: Performance on measures of learning and memory showed very poor verbal memory encoding albeit with normal visual memory performance. It is unclear if this represents a true memory issue or simply confound from the extent of his comprehension and language problems. He did remember some verbal information across time and thus the profile is not consistent with storage issues.   In the verbal realm, immediate recall for material including a  12-item word list, short story, and brief daily living type information was extremely low. He seemed to have more difficulty the more information was presented, literally recalling none of the short story and then doing a bit better with the word list and the daily-living items from a raw score point of view. Delayed recall for the word list was unusually low with 1 word recalled, the short story was impaired with 0 details recalled, and the daily living items were extremely low. He did no better on recognition discriminability testing.   In the visual realm, he did well when asked to recall a modestly complex figure he had been asked to draw earlier, obtaining an average score.   Executive Functions: Findings within this domain suggested fairly significant executive issues with low scores on most tasks. His Executive Function Composite from the [employer] was extremely low and notable for extremely low categories correct and an unusually low perseverative errors score. Answering a series of yes/no questions was unusually low and two of his errors were to simple yes/no questions suggesting comprehension difficulties got in the way. Alternating sequencing of numbers and letters of the alphabet was weak, at the margin of the unusually low and low average ranges. Generation of words in response to given letters was extremely low. Clock drawing was consistent with "moderate" impairment with minor errors in the spatial arrangement of the numbers and no hands despite being asked to set them several times.   Rating Scale(s): Mr. Fogleman was characterized as functioning at a very mild to mild dementia level by his wife, he screened negative for the presence of depression.   Viviano Simas Nicole Kindred, PsyD, ABN Clinical  Neuropsychologist

## 2019-07-01 ENCOUNTER — Encounter: Payer: Self-pay | Admitting: Counselor

## 2019-07-01 ENCOUNTER — Ambulatory Visit (INDEPENDENT_AMBULATORY_CARE_PROVIDER_SITE_OTHER): Payer: Medicare Other | Admitting: Counselor

## 2019-07-01 ENCOUNTER — Other Ambulatory Visit: Payer: Self-pay

## 2019-07-01 DIAGNOSIS — R4701 Aphasia: Secondary | ICD-10-CM

## 2019-07-01 DIAGNOSIS — F03A0 Unspecified dementia, mild, without behavioral disturbance, psychotic disturbance, mood disturbance, and anxiety: Secondary | ICD-10-CM

## 2019-07-01 DIAGNOSIS — F0390 Unspecified dementia without behavioral disturbance: Secondary | ICD-10-CM | POA: Diagnosis not present

## 2019-07-01 NOTE — Progress Notes (Signed)
   [facility]  I met with Donnamarie Poag to review the findings resulting from his neuropsychological evaluation. Since the last appointment, things have been about the same. Time was spent reviewing the impressions and recommendations that are detailed in the evaluation report. I explained to them that I think Armondo's difficulties are likely due to neurodegeneration, resembling a semantic variant PPA, which can be caused by Alzheimer's disease but can also be caused by other types of neurodegeneration. I counseled them about prognosis and preventative strategies, such as healthy lifestyle changes. We discussed behavioral ways of dealing with anger and agitation, which the patient says he worries about, he feels like his temper is quicker than in the past. They both denied any safety concerns, however. Other topics were discussed as reflected in the patient instructions. I took time to explain the findings and answer all the patient's questions. I encouraged Mr. Cutillo to contact me should he have any further questions or if further follow up is desired.   Current Medications and Medical History   Current Outpatient Medications  Medication Sig Dispense Refill  . amLODipine (NORVASC) 5 MG tablet Take 5 mg by mouth daily.    Marland Kitchen atorvastatin (LIPITOR) 40 MG tablet Take 1 tablet (40 mg total) by mouth daily. 90 tablet 3  . Cholecalciferol (VITAMIN D3) 2000 UNITS capsule Take 2,000 Units by mouth daily.     Marland Kitchen donepezil (ARICEPT) 10 MG tablet Take 1 tablet daily 90 tablet 3  . losartan (COZAAR) 100 MG tablet Take 100 mg by mouth daily.    . memantine (NAMENDA) 10 MG tablet Take 1 tablet every night for 2 weeks, then increase to 1 tablet twice a day 60 tablet 5  . metoprolol succinate (TOPROL XL) 25 MG 24 hr tablet Take 3 tablets (75 mg total) by mouth daily. 90 tablet 6  . multivitamin (THERAGRAN) per tablet Take 1 tablet by mouth daily. Takes two per week    .  warfarin (JANTOVEN) 5 MG tablet TAKE AS DIRECTED BY        [facility] 90 tablet 1   No current facility-administered medications for this visit.    Patient Active Problem List   Diagnosis Date Noted  . Encounter for therapeutic drug monitoring 01/10/2017  . Obesity 11/05/2010  . HTN (hypertension) 11/05/2010  . Hyperlipidemia 11/05/2010  . Atrial fibrillation ([place]) 07/13/2010    Mental Status and Behavioral Observations  Mr. Dunivan and his wife Danelle Berry were available at the regularly scheduled time for his follow up appointment. His self-reported mood was "pretty good" and his affect as assessed by vocal quality was congruent. Once again, his thought process was quite circumstantial, he tended to go on tangents and tell rambling stories, although he was amenable to redirection by myself and his wife. Thought content was appropriate to the topics that were discussed. There were no safety concerns (e.g., thoughts of harming self or others) at today's visit.   Plan  Feedback provided regarding the patient's neuropsychological evaluation. They need a new script for Namenda and would like his medications transferred to another pharmacy. I encouraged them to liaison with Dr. Amparo Bristol team to coordinate that. Bertram Gala Manzano was encouraged to contact me if any questions arise or if further follow up is desired.   Viviano Simas Nicole Kindred, PsyD, ABN Clinical Neuropsychologist  Service(s) Provided at This Encounter: 50 minutes [phone]; Conjoint therapy with patient present)

## 2019-07-01 NOTE — Patient Instructions (Signed)
Your performance and presentation on assessment were consistent with cognitive changes, particularly involving language and executive function (e.g., ability to think flexibly, inhibit behavior, and shift from one thing to another). You also had memory problems although only when remembering verbal material, and thus I think your "memory problems" may in large part be secondary to difficulties processing verbal information and executive function problems. Specifically, you had difficulties with single-word comprehension, both on testing and as reported by your wife. That is a rare problem and in conjunction with all your other test data suggests that you most closely resemble a "semantic dementia" syndrome although I'm not sure you meet full criteria.   Dementia refers to a group of syndromes where multiple areas of ability are damaged in the brain, such as memory, thinking, judgment, and behavior, and most commonly refer to age related causes of dementia that cause worsening in these abilities over time. Alzheimer's disease is the most common form of dementia in people over the age of 34. Not all dementias are Alzheimer's disease, but all Alzheimer's disease is dementia. When dementia is due to an underlying condition affecting the brain, such as Alzheimer's disease, there is progression over time, which typically procedes gradually over many years.   The specific type of problem you are having can be caused by several different dementia conditions, most notably Alzheimer's disease and a related condition in the frontotemporal dementia spectrum. In your case, I do not think further workup is necessary because it would not change your prognosis as I am confident this is a neurodegenerative problem and will worsen over time. You are also on anticoagulation, which would make you high risk for a lumbar puncture, the best next step in your diagnostic workup.   Due to significant difficulties with comprehension,  avoid assuming that failure to comply with instructions or respond appropriately is due to inattentiveness or willfulness. Be patient, and attempt to explain what you mean in clear, concise language. Use gestures or other aids to help get the point across. Check understanding by asking him to paraphrase or say what you mean in his own words.   Apathy is a common problem in a number of conditions common in older individuals, including dementia and depression. Apathy robs individuals of their motivation to engage in activities that they once enjoyed and can contribute to a sedentary lifestyle. Often, gentle encouragement (e.g., encouraging your loved one to go on a walk with you and taking the first step) can be extremely helpful. Attempt to motivate through leading by example, providing encouragement, and reinforcing desired behavior. If that is ineffective be persistent but don't be insistent or demanding, which can provoke agitation. Once routines are established they can be hard to change, so do your best to help your loved one maintain an active and healthy routine that includes exercise, meaningful interactions with others, and quality time with each other.   There is now good quality evidence from at least one large scale study that a modified mediterranean diet may help slow cognitive decline. This is known as the "MIND" diet. The Mind diet is not so much a specific diet as it is a set of recommendations for things that you should and should not eat.   Foods that are ENCOURAGED on the MIND Diet:  Green, leafy vegetables: Aim for six or more servings per week. This includes kale, spinach, cooked greens and salads.  All other vegetables: Try to eat another vegetable in addition to the green leafy vegetables at  least once a day. It is best to choose non-starchy vegetables because they have a lot of nutrients with a low number of calories.  Berries: Eat berries at least twice a week. There is a plethora  of research on strawberries, and other berries such as blueberries, raspberries and blackberries have also been found to have antioxidant and brain health benefits.  Nuts: Try to get five servings of nuts or more each week. The creators of the [place] don't specify what kind of nuts to consume, but it is probably best to vary the type of nuts you eat to obtain a variety of nutrients. Peanuts are a legume and do not fall into this category.  Olive oil: Use olive oil as your main cooking oil. There may be other heart-healthy alternatives such as algae oil, though there is not yet sufficient research upon which to base a formal recommendation.  Whole grains: Aim for at least three servings daily. Choose minimally processed grains like oatmeal, quinoa, brown rice, whole-wheat pasta and 100% whole-wheat bread.  Fish: Eat fish at least once a week. It is best to choose fatty fish like salmon, sardines, trout, tuna and mackerel for their high amounts of omega-3 fatty acids.  Beans: Include beans in at least four meals every week. This includes all beans, lentils and soybeans.  Poultry: Try to eat chicken or [place] at least twice a week. Note that fried chicken is not encouraged on the MIND diet.  Wine: Aim for no more than one glass of alcohol daily. Both red and white wine may benefit the brain. However, much research has focused on the red wine compound resveratrol, which may help protect against Alzheimer's disease.  Foods that are DISCOURAGED on the MIND Diet: Butter and margarine: Try to eat less than 1 tablespoon (about 14 grams) daily. Instead, try using olive oil as your primary cooking fat, and dipping your bread in olive oil with herbs.  Cheese: The MIND diet recommends limiting your cheese consumption to less than once per week.  Red meat: Aim for no more than three servings each week. This includes all beef, pork, lamb and products made from these meats.  [place] food: The MIND diet highly  discourages fried food, especially the kind from fast-food restaurants. Limit your consumption to less than once per week.  Pastries and sweets: This includes most of the processed junk food and desserts you can think of. Ice cream, cookies, brownies, snack cakes, donuts, candy and more. Try to limit these to no more than four times a week.  As with all capable adults, I would recommend that you consult with an elder care attorney regarding advanced directives if you have not, such as a healthcare power of attorney and living will. Consultation with an elder care attorney can help you protect your estate and communicate your preferences to your loved ones formally, in the event you are not able to do so yourself. I can provide my strong recommendation for the [employer], who are located at [address] in [place], [place]. They can be reached at [phone]. They also have a website [email or web address].   Return for re-evaluation in one to two years. We now have test data to serve as a strong baseline in the future and can monitor your progress over time.

## 2019-07-12 DIAGNOSIS — I482 Chronic atrial fibrillation, unspecified: Secondary | ICD-10-CM | POA: Diagnosis not present

## 2019-07-12 DIAGNOSIS — E7800 Pure hypercholesterolemia, unspecified: Secondary | ICD-10-CM | POA: Diagnosis not present

## 2019-07-12 DIAGNOSIS — I10 Essential (primary) hypertension: Secondary | ICD-10-CM | POA: Diagnosis not present

## 2019-07-17 ENCOUNTER — Ambulatory Visit: Payer: Medicare Other | Attending: Internal Medicine

## 2019-07-17 DIAGNOSIS — Z23 Encounter for immunization: Secondary | ICD-10-CM

## 2019-07-17 NOTE — Progress Notes (Signed)
   [facility]  Name:  John Buckley    MRN: [id number] DOB: 1945-05-12  07/17/2019  John Buckley was observed post Covid-19 immunization for 15 minutes without incident. He was provided with Vaccine Information Sheet and instruction to access the V-Safe system.   John Buckley was instructed to call 911 with any severe reactions post vaccine: Marland Kitchen Difficulty breathing  . Swelling of face and throat  . A fast heartbeat  . A bad rash all over body  . Dizziness and weakness   Immunizations Administered    Name Date Dose VIS Date Route   Pfizer COVID-19 Vaccine 07/17/2019  1:11 PM 0.3 mL 03/29/2019 Intramuscular   Manufacturer: [place]   Lot: [id number]   [place]: [id number]

## 2019-07-18 ENCOUNTER — Other Ambulatory Visit: Payer: Self-pay

## 2019-07-18 ENCOUNTER — Telehealth: Payer: Self-pay | Admitting: Neurology

## 2019-07-18 MED ORDER — MEMANTINE HCL 10 MG PO TABS: ORAL_TABLET | ORAL | 5 refills | Status: DC

## 2019-07-18 NOTE — Telephone Encounter (Signed)
Patient's wife called and requested her husband's prescription for memantine 10 MG be sent to a new mail order pharmacy below in 3 month increments.  [facility]

## 2019-07-22 ENCOUNTER — Ambulatory Visit (INDEPENDENT_AMBULATORY_CARE_PROVIDER_SITE_OTHER): Payer: Medicare Other | Admitting: Cardiovascular Disease

## 2019-07-22 ENCOUNTER — Ambulatory Visit (INDEPENDENT_AMBULATORY_CARE_PROVIDER_SITE_OTHER): Payer: Medicare Other | Admitting: *Deleted

## 2019-07-22 ENCOUNTER — Encounter: Payer: Self-pay | Admitting: Cardiovascular Disease

## 2019-07-22 ENCOUNTER — Other Ambulatory Visit: Payer: Self-pay

## 2019-07-22 VITALS — BP 114/76 | HR 56 | Ht 74.0 in | Wt 215.5 lb

## 2019-07-22 DIAGNOSIS — I10 Essential (primary) hypertension: Secondary | ICD-10-CM

## 2019-07-22 DIAGNOSIS — I4819 Other persistent atrial fibrillation: Secondary | ICD-10-CM

## 2019-07-22 DIAGNOSIS — Z5181 Encounter for therapeutic drug level monitoring: Secondary | ICD-10-CM

## 2019-07-22 DIAGNOSIS — I4891 Unspecified atrial fibrillation: Secondary | ICD-10-CM

## 2019-07-22 LAB — POCT INR: INR: 2.5 (ref 2.0–3.0)

## 2019-07-22 MED ORDER — METOPROLOL SUCCINATE ER 50 MG PO TB24: 50.0000 mg | ORAL_TABLET | Freq: Every day | ORAL | 3 refills | Status: DC

## 2019-07-22 NOTE — Patient Instructions (Addendum)
Medication Instructions:  Your physician has recommended you make the following change in your medication:  DECREASE Toprol XL (Metoprolol succinate) to 50 mg once daily  *If you need a refill on your cardiac medications before your next appointment, please call your pharmacy*   Lab Work: None Ordered If you have labs (blood work) drawn today and your tests are completely normal, you will receive your results only by: Marland Kitchen MyChart Message (if you have MyChart) OR . A paper copy in the mail If you have any lab test that is abnormal or we need to change your treatment, we will call you to review the results.   Testing/Procedures: None Ordered   Follow-Up: At [facility], you and your health needs are our priority.  As part of our continuing mission to provide you with exceptional heart care, we have created designated Provider Care Teams.  These Care Teams include your primary Cardiologist (physician) and Advanced Practice Providers (APPs -  Physician Assistants and Nurse Practitioners) who all work together to provide you with the care you need, when you need it.  We recommend signing up for the patient portal called "MyChart".  Sign up information is provided on this After Visit Summary.  MyChart is used to connect with patients for Virtual Visits (Telemedicine).  Patients are able to view lab/test results, encounter notes, upcoming appointments, etc.  Non-urgent messages can be sent to your provider as well.   To learn more about what you can do with MyChart, go to [email or web address].    Your next appointment:   1 year(s)  The format for your next appointment:   In Person  Provider:   You may see Mertie Moores, MD or one of the following Advanced Practice Providers on your designated Care Team:    Richardson Dopp, PA-C  [place], [place]  Daune Perch, [place]

## 2019-07-22 NOTE — Patient Instructions (Signed)
Description   Continue taking 1/2 tablet daily except 1 tablet on Sundays, Tuesdays, and Thursdays. Recheck INR 6 weeks. Call with any medication changes or procedures [phone]    

## 2019-07-22 NOTE — Progress Notes (Signed)
Cardiology Office Note   Date:  07/22/2019   ID:  John Buckley, DOB 29-Sep-1945, MRN [id number]  PCP:  Lavone Orn, MD  Cardiologist:   Mertie Moores, MD   No chief complaint on file.  1. Atrial fibrillation 2. Obesity 3. Hyperlipidemia  4. Chronic ankle relation 5. Hypertension  History of Present Illness:  John Buckley is a 74 year old gentleman with history of chronic atrial fibrillation, obesity, hyperlipidemia, chronic anticoagulation, hypertension, and ongoing cigarette smoking. He's done very well since I last saw him. Unfortunately he still smokes about a pack of cigarettes a day. He's not had any particular problems.  During his last visit we increased his lisinopril to 20 mg a day which resulted in a well controlled blood pressure. Unfortunately he reduced his dose back down to 10 mg a day because he thought he didn't need the extra dose. He is feeling quite well. He denies any episodes of chest pain or shortness of breath.  July 18, 2012:  John Buckley is doing well. No chest pain, no dyspnea. Getting some exercise.  July 19, 2013:  Feeling well. Exercising some. Still smoking - 1/2 ppd.    August 08, 2014:  John Buckley is a 74 y.o. male who presents for follow-up of his atrial fibrillation. No CP , no dyspnea.  Getting some exercise.   December 05, 2014:   John Buckley is seen today for follow-up of his atrial fibrillation. No chest pains or shortness breath. He's getting a little bit of exercise. Wants to start golfing . Joining silver sneakers   We changed him to Valsartan 160 mg during the last vist. He is tolerating this well.   Feb. 16, 2017:  No cardiac issues.  Had a mass in his right groin  - looked like a bruise,   Popped and has drained. Dr. Laurann Montana has evaluated this  - may have been a cyst  June 29, 2016:  Is doing well Got a new dog recently - Pitbull.  Has lost 27 lbs walking with the dog.    Still in atrial fib   Aug 30, 2017:  John Buckley is  seen today for follow-up visit.  He remains in atrial fibrillation.  He has a history of hypertension. Has been losing some weight.   Has been on a better diet  Wt. Is 230 , down from 300 several years ago  Feeling well  , has cut back on bread  Still smoking .   July 21, 2019: John Buckley is seen today  Has lost 15 more lbs. Wt today is 215 lbs.  Has stopped smoking .       Past Medical History:  Diagnosis Date  . Atrial fibrillation ([place])   . Hyperlipidemia   . Hypertension   . Long-term (current) use of anticoagulants   . Tobacco abuse     Past Surgical History:  Procedure Laterality Date  . CARDIAC CATHETERIZATION  01/15/2003   Est. EF is around 65% --  Relatively smooth coronary arteries.  He has a few minor luminal irregularities -- Normal left ventricular systolic function -- Thayer Headings, M.D.  . CARDIOVERSION  08/04/20006   was successful  . COLONOSCOPY W/ POLYPECTOMY  12/02/2003   A moderate-sized rectal polyp at 15 cm -- ohn C. Amedeo Plenty, M.D.  . NOSE SURGERY    . TONSILLECTOMY       Current Outpatient Medications  Medication Sig Dispense Refill  . amLODipine (NORVASC) 5 MG tablet Take 5 mg by mouth daily.    Marland Kitchen  atorvastatin (LIPITOR) 40 MG tablet Take 1 tablet (40 mg total) by mouth daily. 90 tablet 3  . Cholecalciferol (VITAMIN D3) 2000 UNITS capsule Take 2,000 Units by mouth daily.     Marland Kitchen donepezil (ARICEPT) 10 MG tablet Take 1 tablet daily 90 tablet 3  . losartan (COZAAR) 100 MG tablet Take 100 mg by mouth daily.    . memantine (NAMENDA) 10 MG tablet Take 1 tablet every night for 2 weeks, then increase to 1 tablet twice a day 60 tablet 5  . multivitamin (THERAGRAN) per tablet Take 1 tablet by mouth daily. Takes two per week    . warfarin (JANTOVEN) 5 MG tablet TAKE AS DIRECTED BY        [facility] 90 tablet 1  . metoprolol succinate (TOPROL-XL) 50 MG 24 hr tablet Take 1 tablet (50 mg total) by mouth daily. Take with or immediately following a meal. 90 tablet 3    No current facility-administered medications for this visit.    Allergies:   Lisinopril    Social History:  The patient  reports that he has quit smoking. His smoking use included cigarettes. He has a 19.50 pack-year smoking history. He has never used smokeless tobacco. He reports that he does not drink alcohol or use drugs.   Family History:  The patient's family history includes Alzheimer's disease in his mother; Colon cancer in an other family member.    ROS: Noted in current history, otherwise review of systems is negative.   Physical Exam: Blood pressure 114/76, pulse (!) 56, height 6\' 2"  (1.88 m), weight 215 lb 8 oz (97.8 kg), SpO2 97 %.  GEN:  Well nourished, well developed in no acute distress HEENT: Normal NECK: No JVD; No carotid bruits LYMPHATICS: No lymphadenopathy CARDIAC: irreg. Irreg.  RESPIRATORY:  Clear to auscultation without rales, wheezing or rhonchi  ABDOMEN: Soft, non-tender, non-distended MUSCULOSKELETAL:  No edema; No deformity  SKIN: Warm and dry NEUROLOGIC:  Alert and oriented x 3    EKG:     Recent Labs: No results found for requested labs within last 8760 hours.    Lipid Panel    Component Value Date/Time   CHOL 129 08/30/2017 1019   TRIG 149 08/30/2017 1019   HDL 46 08/30/2017 1019   CHOLHDL 2.8 08/30/2017 1019   CHOLHDL 3 05/04/2011 1001   VLDL 35.0 05/04/2011 1001   LDLCALC 53 08/30/2017 1019      Wt Readings from Last 3 Encounters:  07/22/19 215 lb 8 oz (97.8 kg)  05/28/19 210 lb (95.3 kg)  01/21/19 209 lb 12.8 oz (95.2 kg)      Other studies Reviewed: Additional studies/ records that were reviewed today include: . Review of the above records demonstrates:    ASSESSMENT AND PLAN:  1.  Atrial fibrillation -   Doing ok  HR is slow.   Will reduce toprol xl to 50 mg a day    2. Obesity -    Has lost weight .  Looking good   3. Hyperlipidemia -   Labs from Dr. Laurann Montana look good  4.  Hypertension-    BP looks good    5.  COPD :   Has stopped smoking   Current medicines are reviewed at length with the patient today.  The patient does not have concerns regarding medicines.  The following changes have been made:  no change  Labs/ tests ordered today include:  No orders of the defined types were placed in this encounter.  Disposition:   FU with me in 1 year    Signed, Mertie Moores, MD  07/22/2019 10:20 AM    [facility] [place], [place], [place]  [address] Phone: [phone]; Fax: [phone]

## 2019-07-24 NOTE — Telephone Encounter (Signed)
Has this been completed?  Sending to clinical staff for review: Okay to sign/close encounter or is further follow up needed? 

## 2019-07-24 NOTE — Telephone Encounter (Signed)
Script was sent in on 07/18/2019 to [facility]

## 2019-09-04 ENCOUNTER — Other Ambulatory Visit: Payer: Self-pay

## 2019-09-04 ENCOUNTER — Ambulatory Visit (INDEPENDENT_AMBULATORY_CARE_PROVIDER_SITE_OTHER): Payer: Medicare Other | Admitting: *Deleted

## 2019-09-04 DIAGNOSIS — I4891 Unspecified atrial fibrillation: Secondary | ICD-10-CM | POA: Diagnosis not present

## 2019-09-04 DIAGNOSIS — Z5181 Encounter for therapeutic drug level monitoring: Secondary | ICD-10-CM | POA: Diagnosis not present

## 2019-09-04 LAB — POCT INR: INR: 2.4 (ref 2.0–3.0)

## 2019-09-04 NOTE — Patient Instructions (Signed)
Description   Continue taking 1/2 tablet daily except 1 tablet on Sundays, Tuesdays, and Thursdays. Recheck INR 6 weeks. Call with any medication changes or procedures [phone]    

## 2019-10-16 ENCOUNTER — Other Ambulatory Visit: Payer: Self-pay

## 2019-10-16 ENCOUNTER — Ambulatory Visit (INDEPENDENT_AMBULATORY_CARE_PROVIDER_SITE_OTHER): Payer: Medicare Other | Admitting: Pharmacist

## 2019-10-16 DIAGNOSIS — Z5181 Encounter for therapeutic drug level monitoring: Secondary | ICD-10-CM

## 2019-10-16 DIAGNOSIS — I4891 Unspecified atrial fibrillation: Secondary | ICD-10-CM

## 2019-10-16 LAB — POCT INR: INR: 2.1 (ref 2.0–3.0)

## 2019-10-16 NOTE — Patient Instructions (Signed)
Description   Continue taking 1/2 tablet daily except 1 tablet on Sundays, Tuesdays, and Thursdays. Recheck INR 6 weeks. Call with any medication changes or procedures [phone]

## 2019-11-27 ENCOUNTER — Ambulatory Visit (INDEPENDENT_AMBULATORY_CARE_PROVIDER_SITE_OTHER): Payer: Medicare Other | Admitting: *Deleted

## 2019-11-27 ENCOUNTER — Other Ambulatory Visit: Payer: Self-pay

## 2019-11-27 DIAGNOSIS — I4891 Unspecified atrial fibrillation: Secondary | ICD-10-CM | POA: Diagnosis not present

## 2019-11-27 DIAGNOSIS — Z5181 Encounter for therapeutic drug level monitoring: Secondary | ICD-10-CM

## 2019-11-27 LAB — POCT INR: INR: 2.5 (ref 2.0–3.0)

## 2019-11-27 NOTE — Patient Instructions (Signed)
Description   Continue taking Warfarin 1/2 tablet daily except 1 tablet on Sundays, Tuesdays, and Thursdays. Recheck INR 6 weeks. Call with any medication changes or procedures [phone]     

## 2019-12-25 DIAGNOSIS — G3184 Mild cognitive impairment, so stated: Secondary | ICD-10-CM | POA: Diagnosis not present

## 2019-12-25 DIAGNOSIS — Z1389 Encounter for screening for other disorder: Secondary | ICD-10-CM | POA: Diagnosis not present

## 2019-12-25 DIAGNOSIS — E7800 Pure hypercholesterolemia, unspecified: Secondary | ICD-10-CM | POA: Diagnosis not present

## 2019-12-25 DIAGNOSIS — I10 Essential (primary) hypertension: Secondary | ICD-10-CM | POA: Diagnosis not present

## 2019-12-25 DIAGNOSIS — I482 Chronic atrial fibrillation, unspecified: Secondary | ICD-10-CM | POA: Diagnosis not present

## 2019-12-25 DIAGNOSIS — Z0000 Encounter for general adult medical examination without abnormal findings: Secondary | ICD-10-CM | POA: Diagnosis not present

## 2020-01-03 ENCOUNTER — Other Ambulatory Visit: Payer: Self-pay | Admitting: Neurology

## 2020-01-08 ENCOUNTER — Ambulatory Visit (INDEPENDENT_AMBULATORY_CARE_PROVIDER_SITE_OTHER): Payer: Medicare Other | Admitting: Pharmacist

## 2020-01-08 ENCOUNTER — Other Ambulatory Visit: Payer: Self-pay

## 2020-01-08 DIAGNOSIS — Z5181 Encounter for therapeutic drug level monitoring: Secondary | ICD-10-CM | POA: Diagnosis not present

## 2020-01-08 DIAGNOSIS — I4891 Unspecified atrial fibrillation: Secondary | ICD-10-CM | POA: Diagnosis not present

## 2020-01-08 LAB — POCT INR: INR: 2.9 (ref 2.0–3.0)

## 2020-01-08 NOTE — Patient Instructions (Signed)
Continue taking Warfarin 1/2 tablet daily except 1 tablet on Sundays, Tuesdays, and Thursdays. Recheck INR 6 weeks. Call with any medication changes or procedures [phone]

## 2020-01-16 DIAGNOSIS — I482 Chronic atrial fibrillation, unspecified: Secondary | ICD-10-CM | POA: Diagnosis not present

## 2020-01-16 DIAGNOSIS — I10 Essential (primary) hypertension: Secondary | ICD-10-CM | POA: Diagnosis not present

## 2020-01-16 DIAGNOSIS — E7800 Pure hypercholesterolemia, unspecified: Secondary | ICD-10-CM | POA: Diagnosis not present

## 2020-01-24 ENCOUNTER — Other Ambulatory Visit: Payer: Self-pay

## 2020-01-24 ENCOUNTER — Ambulatory Visit (INDEPENDENT_AMBULATORY_CARE_PROVIDER_SITE_OTHER): Payer: Medicare Other | Admitting: Neurology

## 2020-01-24 ENCOUNTER — Encounter: Payer: Self-pay | Admitting: Neurology

## 2020-01-24 VITALS — BP 126/78 | HR 63 | Ht 73.0 in | Wt 217.2 lb

## 2020-01-24 DIAGNOSIS — F0390 Unspecified dementia without behavioral disturbance: Secondary | ICD-10-CM

## 2020-01-24 DIAGNOSIS — F03A0 Unspecified dementia, mild, without behavioral disturbance, psychotic disturbance, mood disturbance, and anxiety: Secondary | ICD-10-CM

## 2020-01-24 MED ORDER — DONEPEZIL HCL 10 MG PO TABS: ORAL_TABLET | ORAL | 3 refills | Status: DC

## 2020-01-24 MED ORDER — MEMANTINE HCL 10 MG PO TABS: ORAL_TABLET | ORAL | 3 refills | Status: DC

## 2020-01-24 MED ORDER — ESCITALOPRAM OXALATE 10 MG PO TABS: 10.0000 mg | ORAL_TABLET | Freq: Every day | ORAL | 2 refills | Status: DC

## 2020-01-24 NOTE — Patient Instructions (Addendum)
1. Continue Donepezil 10mg  daily and Memantine 10mg  twice a day  2. Start Lexapro 10mg  daily, call if you would like refills.  3. Continue to monitor driving  4. Follow-up in 6 months, call for any changes   FALL PRECAUTIONS: Be cautious when walking. Scan the area for obstacles that may increase the risk of trips and falls. When getting up in the mornings, sit up at the edge of the bed for a few minutes before getting out of bed. Consider elevating the bed at the head end to avoid drop of blood pressure when getting up. Walk always in a well-lit room (use night lights in the walls). Avoid area rugs or power cords from appliances in the middle of the walkways. Use a walker or a cane if necessary and consider physical therapy for balance exercise. Get your eyesight checked regularly.  FINANCIAL OVERSIGHT: Supervision, especially oversight when making financial decisions or transactions is also recommended as difficulties arise.  HOME SAFETY: Consider the safety of the kitchen when operating appliances like stoves, microwave oven, and blender. Consider having supervision and share cooking responsibilities until no longer able to participate in those. Accidents with firearms and other hazards in the house should be identified and addressed as well.  DRIVING: Regarding driving, in patients with progressive memory problems, driving will be impaired. We advise to have someone else do the driving if trouble finding directions or if minor accidents are reported. Independent driving assessment is available to determine safety of driving.  ABILITY TO BE LEFT ALONE: If patient is unable to contact 911 operator, consider using LifeLine, or when the need is there, arrange for someone to stay with patients. Smoking is a fire hazard, consider supervision or cessation. Risk of wandering should be assessed by caregiver and if detected at any point, supervision and safe proof recommendations should be  instituted.  MEDICATION SUPERVISION: Inability to self-administer medication needs to be constantly addressed. Implement a mechanism to ensure safe administration of the medications.  RECOMMENDATIONS FOR ALL PATIENTS WITH MEMORY PROBLEMS: 1. Continue to exercise (Recommend 30 minutes of walking everyday, or 3 hours every week) 2. Increase social interactions - continue going to [place] and enjoy social gatherings with friends and family 3. Eat healthy, avoid fried foods and eat more fruits and vegetables 4. Maintain adequate blood pressure, blood sugar, and blood cholesterol level. Reducing the risk of stroke and cardiovascular disease also helps promoting better memory. 5. Avoid stressful situations. Live a simple life and avoid aggravations. Organize your time and prepare for the next day in anticipation. 6. Sleep well, avoid any interruptions of sleep and avoid any distractions in the bedroom that may interfere with adequate sleep quality 7. Avoid sugar, avoid sweets as there is a strong link between excessive sugar intake, diabetes, and cognitive impairment The Mediterranean diet has been shown to help patients reduce the risk of progressive memory disorders and reduces cardiovascular risk. This includes eating fish, eat fruits and green leafy vegetables, nuts like almonds and hazelnuts, walnuts, and also use olive oil. Avoid fast foods and fried foods as much as possible. Avoid sweets and sugar as sugar use has been linked to worsening of memory function.  There is always a concern of gradual progression of memory problems. If this is the case, then we may need to adjust level of care according to patient needs. Support, both to the patient and caregiver, should then be put into place.

## 2020-01-24 NOTE — Progress Notes (Signed)
NEUROLOGY FOLLOW UP OFFICE NOTE  John Buckley [id number] 1946/03/22  HISTORY OF PRESENT ILLNESS: I had the pleasure of seeing John Buckley in follow-up in the neurology clinic on 01/24/2020.  The patient was last seen 8 months ago for memory loss. He is again accompanied by his wife who helps supplement the history today.  Records and images were personally reviewed where available.  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 He is on Donepezil 10mg  daily and Memantine 10mg  BID without side effects. He does not feel it is helping. He continues to drive, his wife denies any driving concerns. He continues to manage his own medication without issues. He is noted to have some word-finding difficulties in the office. We discussed mood changes that were noted on prior visit, his wife notes he is normally upbeat, but gets angry easily when he is interrupted or told to do things. Temper is quick, then he is fine after. No paranoia or hallucinations. He denies any headaches, dizziness, vision changes, focal numbness/tingling/weakness, no falls. Sleep is good.    History on Initial Assessment 08/28/2017: This is a pleasant 74 year old right-handed man with a history of hypertension, hyperlipidemia, atrial fibrillation, tobacco use, presenting for evaluation of worsening memory. He feels his memory is "not worth a hoot." He and his wife started noticing changes a little over a year ago. He feels that it seemed to start all of a sudden, he would be having a conversation and could not think of a word in the middle of his sentence. It takes a minute or two to come back. He is misplacing things frequently at home. He continues to drive without getting lost. Most of the  bills are on draft, he denies missing bills. He has an alarm for his medications and rarely misses them. His wife first noticed changes when they were writing Christmas cards together, he was having trouble spelling addresses copying them from their address book. This year they decided to use labels. He has more difficulty concentrating and following programs on TV as easily as he used to. They were driving in [place] last August, which they do every year, but she noticed a change in behavior which was just out of character. They are quite familiar with the city but he became really anxious bout where to turn and what to do. He reports his wife was navigating with her phone and they were using a rental car, which stressed him out. Otherwise at home, she has not noticed similar significant anxiety. He has always had a quick temper and is a little more irritable/cranky now. No paranoia or hallucinations. He is able to bathe and dress independently.   He has brief unsteadiness when he lays on his right side and gets up, with associated nausea. He denies any headaches, diplopia, dysarthria/dysphagia, neck/back pain, focal numbness/tingling/weakness, bladder dysfunction, tremor. He has frequent bowel movements. His sense of smell has been affected by his smoking. His mother and maternal aunt had dementia. He denies any history of significant head injuries. He drinks alcohol occasionally.   Laboratory Data: 11/2016 TSH and B12 normal at PCP office. MRI brain without contrast done 08/2017 which did not show any acute changes. There was moderate diffuse atrophy, mild chronic microvascular disease.  PAST MEDICAL HISTORY: Past Medical History:  Diagnosis Date  . Atrial fibrillation ([place])   . Hyperlipidemia   . Hypertension   . Long-term (current) use of anticoagulants   . Tobacco abuse     MEDICATIONS: Current Outpatient Medications on File Prior to Visit  Medication Sig Dispense Refill  . amLODipine  (NORVASC) 5 MG tablet Take 5 mg by mouth daily.    Marland Kitchen atorvastatin (LIPITOR) 40 MG tablet Take 1 tablet (40 mg total) by mouth daily. 90 tablet 3  . BETAMETHASONE PO Take by mouth as needed.    . Cholecalciferol (VITAMIN D3) 2000 UNITS capsule Take 2,000 Units by mouth daily.     Marland Kitchen donepezil (ARICEPT) 10 MG tablet Take 1 tablet daily 90 tablet 3  . losartan (COZAAR) 100 MG tablet Take 100 mg by mouth daily.    . memantine (NAMENDA) 10 MG tablet Take 1 tablet every night for 2 weeks, then increase to 1 tablet twice a day 60 tablet 5  . metoprolol succinate (TOPROL-XL) 50 MG 24 hr tablet Take 1 tablet (50 mg total) by mouth daily. Take with or immediately following a meal. 90 tablet 3  . multivitamin (THERAGRAN) per tablet Take 1 tablet by mouth daily. Takes two per week    . warfarin (JANTOVEN) 5 MG tablet TAKE AS DIRECTED BY        [facility] 90 tablet 1   No current facility-administered medications on file prior to visit.    ALLERGIES: Allergies  Allergen Reactions  . Lisinopril Cough    FAMILY HISTORY: Family History  Problem Relation Age of Onset  . Alzheimer's disease Mother   . Colon cancer Other        family history of     SOCIAL HISTORY: Social History   Socioeconomic History  . Marital status: Married    Spouse name: Not on file  . Number of children: 2  . Years of education: 82  . Highest education level: Not on file  Occupational History  . Occupation: retired  Tobacco Use  . Smoking status: Former Smoker    Packs/day: 0.50    Years: 39.00    Pack years: 19.50    Types: Cigarettes  . Smokeless tobacco: Never Used  Vaping Use  . Vaping Use: Never used  Substance and Sexual Activity  . Alcohol use: Yes    Comment: once in a while   . Drug use: No  . Sexual activity: Not on file  Other Topics Concern  . Not on file  Social History Narrative   Lives with wife in two story home      Takes frequent walks with dog      Right handed      Highest  level of edu- certificate program   Social Determinants of Health   Financial Resource Strain:   . Difficulty of Paying Living Expenses: Not on file  Food Insecurity:   . Worried About Charity fundraiser in the Last Year: Not on file  . Ran Out of Food in the Last Year: Not on file  Transportation Needs:   . Lack of Transportation (Medical): Not on file  . Lack of Transportation (Non-Medical): Not on file  Physical Activity:   . Days of Exercise per Week: Not on file  . Minutes of Exercise per Session: Not on file  Stress:   . Feeling of Stress : Not on file  Social Connections:   . Frequency of Communication with Friends and Family: Not on file  .  Frequency of Social Gatherings with Friends and Family: Not on file  . Attends Religious Services: Not on file  . Active Member of Clubs or Organizations: Not on file  . Attends Archivist Meetings: Not on file  . Marital Status: Not on file  Intimate Partner Violence:   . Fear of Current or Ex-Partner: Not on file  . Emotionally Abused: Not on file  . Physically Abused: Not on file  . Sexually Abused: Not on file     PHYSICAL EXAM: Vitals:   01/24/20 1109  BP: 126/78  Pulse: 63  SpO2: 96%   General: No acute distress Head:  Normocephalic/atraumatic Skin/Extremities: No rash, no edema Neurological Exam: awake and alert. No aphasia or dysarthria. Fund of knowledge is appropriate.  Recent and remote memory are impaired. Attention and concentration are normal.   Cranial nerves: Pupils equal, round. Extraocular movements intact with no nystagmus. Visual fields full.  No facial asymmetry.  Motor: Bulk and tone normal, muscle strength 5/5 throughout with no pronator drift.   Finger to nose testing intact.  Gait narrow-based and steady, no ataxia.   IMPRESSION: This is a pleasant 74 yo RH man with a history of hypertension, hyperlipidemia, atrial fibrillation, tobacco use, with mild dementia. 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 Findings discussed with patient and wife today. He is on Donepezil 10mg  daily and Memantine 10mg  BID, expectation from medications reviewed. We discussed mood changes that occur with dementia, they would like to proceed with starting low dose Lexapro 10mg  daily, side effects discussed. Continue to monitor driving. We discussed the importance of control of vascular risk factors, physical exercise, and brain stimulation exercises for brain health. Speech therapy was discussed, which he declines at this time. Follow-up in 6 months, they know to call for any changes.   Thank you for allowing me to participate in his care.  Please do not hesitate to call for any questions or concerns.   Ellouise Newer, M.D.   CC: Dr. Laurann Montana

## 2020-01-25 DIAGNOSIS — Z23 Encounter for immunization: Secondary | ICD-10-CM | POA: Diagnosis not present

## 2020-02-01 ENCOUNTER — Other Ambulatory Visit: Payer: Self-pay

## 2020-02-01 ENCOUNTER — Ambulatory Visit: Payer: Medicare Other | Attending: Internal Medicine

## 2020-02-01 DIAGNOSIS — Z23 Encounter for immunization: Secondary | ICD-10-CM

## 2020-02-01 NOTE — Progress Notes (Signed)
° °  Covid-19 Vaccination Clinic  Name:  MICAEL BARB    MRN: [id number] DOB: 1945-05-15  02/01/2020  Mr. Fuhrer was observed post Covid-19 immunization for 15 minutes without incident. He was provided with Vaccine Information Sheet and instruction to access the V-Safe system.   Mr. Garriga was instructed to call 911 with any severe reactions post vaccine:  Difficulty breathing   Swelling of face and throat   A fast heartbeat   A bad rash all over body   Dizziness and weakness

## 2020-02-19 ENCOUNTER — Other Ambulatory Visit: Payer: Self-pay

## 2020-02-19 ENCOUNTER — Ambulatory Visit (INDEPENDENT_AMBULATORY_CARE_PROVIDER_SITE_OTHER): Payer: Medicare Other | Admitting: Pharmacist

## 2020-02-19 DIAGNOSIS — I4891 Unspecified atrial fibrillation: Secondary | ICD-10-CM | POA: Diagnosis not present

## 2020-02-19 DIAGNOSIS — Z5181 Encounter for therapeutic drug level monitoring: Secondary | ICD-10-CM | POA: Diagnosis not present

## 2020-02-19 LAB — POCT INR: INR: 2.6 (ref 2.0–3.0)

## 2020-02-19 NOTE — Patient Instructions (Signed)
Description   Continue taking Warfarin 1/2 tablet daily except 1 tablet on Sundays, Tuesdays, and Thursdays. Recheck INR 6 weeks. Call with any medication changes or procedures [phone]

## 2020-03-05 DIAGNOSIS — Z85828 Personal history of other malignant neoplasm of skin: Secondary | ICD-10-CM | POA: Diagnosis not present

## 2020-03-05 DIAGNOSIS — L821 Other seborrheic keratosis: Secondary | ICD-10-CM | POA: Diagnosis not present

## 2020-03-05 DIAGNOSIS — L738 Other specified follicular disorders: Secondary | ICD-10-CM | POA: Diagnosis not present

## 2020-03-05 DIAGNOSIS — L814 Other melanin hyperpigmentation: Secondary | ICD-10-CM | POA: Diagnosis not present

## 2020-03-05 DIAGNOSIS — L905 Scar conditions and fibrosis of skin: Secondary | ICD-10-CM | POA: Diagnosis not present

## 2020-03-05 DIAGNOSIS — L819 Disorder of pigmentation, unspecified: Secondary | ICD-10-CM | POA: Diagnosis not present

## 2020-03-05 DIAGNOSIS — D229 Melanocytic nevi, unspecified: Secondary | ICD-10-CM | POA: Diagnosis not present

## 2020-03-06 ENCOUNTER — Other Ambulatory Visit: Payer: Self-pay | Admitting: Cardiovascular Disease

## 2020-03-17 DIAGNOSIS — I10 Essential (primary) hypertension: Secondary | ICD-10-CM | POA: Diagnosis not present

## 2020-03-17 DIAGNOSIS — E7800 Pure hypercholesterolemia, unspecified: Secondary | ICD-10-CM | POA: Diagnosis not present

## 2020-03-26 DIAGNOSIS — K1370 Unspecified lesions of oral mucosa: Secondary | ICD-10-CM | POA: Diagnosis not present

## 2020-04-01 ENCOUNTER — Other Ambulatory Visit: Payer: Self-pay

## 2020-04-01 ENCOUNTER — Ambulatory Visit (INDEPENDENT_AMBULATORY_CARE_PROVIDER_SITE_OTHER): Payer: Medicare Other | Admitting: *Deleted

## 2020-04-01 DIAGNOSIS — I4891 Unspecified atrial fibrillation: Secondary | ICD-10-CM

## 2020-04-01 DIAGNOSIS — Z5181 Encounter for therapeutic drug level monitoring: Secondary | ICD-10-CM

## 2020-04-01 LAB — POCT INR: INR: 2.8 (ref 2.0–3.0)

## 2020-04-01 NOTE — Patient Instructions (Signed)
Description   Continue taking Warfarin 1/2 tablet daily except 1 tablet on Sundays, Tuesdays, and Thursdays. Recheck INR 6 weeks. Call with any medication changes or procedures [phone]

## 2020-04-14 DIAGNOSIS — J40 Bronchitis, not specified as acute or chronic: Secondary | ICD-10-CM | POA: Diagnosis not present

## 2020-04-14 DIAGNOSIS — J329 Chronic sinusitis, unspecified: Secondary | ICD-10-CM | POA: Diagnosis not present

## 2020-04-14 DIAGNOSIS — R059 Cough, unspecified: Secondary | ICD-10-CM | POA: Diagnosis not present

## 2020-05-06 ENCOUNTER — Telehealth: Payer: Self-pay | Admitting: Neurology

## 2020-05-06 DIAGNOSIS — F0390 Unspecified dementia without behavioral disturbance: Secondary | ICD-10-CM

## 2020-05-06 DIAGNOSIS — F03A0 Unspecified dementia, mild, without behavioral disturbance, psychotic disturbance, mood disturbance, and anxiety: Secondary | ICD-10-CM

## 2020-05-06 MED ORDER — DONEPEZIL HCL 10 MG PO TABS: ORAL_TABLET | ORAL | 3 refills | Status: DC

## 2020-05-06 MED ORDER — MEMANTINE HCL 10 MG PO TABS: ORAL_TABLET | ORAL | 3 refills | Status: DC

## 2020-05-06 MED ORDER — ESCITALOPRAM OXALATE 10 MG PO TABS: 10.0000 mg | ORAL_TABLET | Freq: Every day | ORAL | 3 refills | Status: DC

## 2020-05-06 NOTE — Telephone Encounter (Signed)
Patient's wife called in stating they changed insurance plans and the pharmacy told them new prescriptions would need to be sent in for the patient's Lexapro and a 90 day prescription of memantine. They should be sent to [facility].

## 2020-05-13 ENCOUNTER — Ambulatory Visit (INDEPENDENT_AMBULATORY_CARE_PROVIDER_SITE_OTHER): Payer: Medicare Other | Admitting: *Deleted

## 2020-05-13 ENCOUNTER — Other Ambulatory Visit: Payer: Self-pay

## 2020-05-13 DIAGNOSIS — Z5181 Encounter for therapeutic drug level monitoring: Secondary | ICD-10-CM | POA: Diagnosis not present

## 2020-05-13 DIAGNOSIS — I4891 Unspecified atrial fibrillation: Secondary | ICD-10-CM

## 2020-05-13 LAB — POCT INR: INR: 4.1 — AB (ref 2.0–3.0)

## 2020-05-13 NOTE — Patient Instructions (Addendum)
Description   Do not take any Warfarin today then continue taking Warfarin 1/2 tablet daily except 1 tablet on Sundays, Tuesdays, and Thursdays. Have a leafy veggie today and remain consistent. Recheck INR in 4 weeks (normally 6 weeks). Call with any medication changes or procedures [phone]

## 2020-06-05 DIAGNOSIS — F0390 Unspecified dementia without behavioral disturbance: Secondary | ICD-10-CM | POA: Diagnosis not present

## 2020-06-05 DIAGNOSIS — E7800 Pure hypercholesterolemia, unspecified: Secondary | ICD-10-CM | POA: Diagnosis not present

## 2020-06-05 DIAGNOSIS — I10 Essential (primary) hypertension: Secondary | ICD-10-CM | POA: Diagnosis not present

## 2020-06-10 ENCOUNTER — Other Ambulatory Visit: Payer: Self-pay

## 2020-06-10 ENCOUNTER — Ambulatory Visit (INDEPENDENT_AMBULATORY_CARE_PROVIDER_SITE_OTHER): Payer: Medicare Other | Admitting: *Deleted

## 2020-06-10 DIAGNOSIS — I4891 Unspecified atrial fibrillation: Secondary | ICD-10-CM | POA: Diagnosis not present

## 2020-06-10 DIAGNOSIS — Z5181 Encounter for therapeutic drug level monitoring: Secondary | ICD-10-CM | POA: Diagnosis not present

## 2020-06-10 DIAGNOSIS — R310 Gross hematuria: Secondary | ICD-10-CM | POA: Diagnosis not present

## 2020-06-10 DIAGNOSIS — N3001 Acute cystitis with hematuria: Secondary | ICD-10-CM | POA: Diagnosis not present

## 2020-06-10 DIAGNOSIS — R300 Dysuria: Secondary | ICD-10-CM | POA: Diagnosis not present

## 2020-06-10 LAB — POCT INR: INR: 2.1 (ref 2.0–3.0)

## 2020-06-10 NOTE — Patient Instructions (Addendum)
Description   Continue taking Warfarin 1/2 tablet daily except 1 tablet on Sundays, Tuesdays, and Thursdays.  Recheck INR in 5 weeks. Call with any medication changes or procedures [phone]

## 2020-06-12 ENCOUNTER — Other Ambulatory Visit: Payer: Self-pay | Admitting: Pharmacist

## 2020-06-12 MED ORDER — WARFARIN SODIUM 5 MG PO TABS: ORAL_TABLET | ORAL | 0 refills | Status: DC

## 2020-06-23 DIAGNOSIS — K591 Functional diarrhea: Secondary | ICD-10-CM | POA: Diagnosis not present

## 2020-06-23 DIAGNOSIS — F0390 Unspecified dementia without behavioral disturbance: Secondary | ICD-10-CM | POA: Diagnosis not present

## 2020-06-23 DIAGNOSIS — I10 Essential (primary) hypertension: Secondary | ICD-10-CM | POA: Diagnosis not present

## 2020-06-23 DIAGNOSIS — I482 Chronic atrial fibrillation, unspecified: Secondary | ICD-10-CM | POA: Diagnosis not present

## 2020-07-01 DIAGNOSIS — R300 Dysuria: Secondary | ICD-10-CM | POA: Diagnosis not present

## 2020-07-01 DIAGNOSIS — R310 Gross hematuria: Secondary | ICD-10-CM | POA: Diagnosis not present

## 2020-07-15 ENCOUNTER — Ambulatory Visit (INDEPENDENT_AMBULATORY_CARE_PROVIDER_SITE_OTHER): Payer: Medicare Other | Admitting: *Deleted

## 2020-07-15 ENCOUNTER — Other Ambulatory Visit: Payer: Self-pay

## 2020-07-15 DIAGNOSIS — I4891 Unspecified atrial fibrillation: Secondary | ICD-10-CM

## 2020-07-15 DIAGNOSIS — Z5181 Encounter for therapeutic drug level monitoring: Secondary | ICD-10-CM | POA: Diagnosis not present

## 2020-07-15 LAB — POCT INR: INR: 3.3 — AB (ref 2.0–3.0)

## 2020-07-15 NOTE — Patient Instructions (Signed)
Description   Do not take any Warfarin tomorrow (since you already taken today's dose) then continue taking Warfarin 1/2 tablet daily except 1 tablet on Sundays, Tuesdays, and Thursdays.  Recheck INR in 4 weeks. Call with any medication changes or procedures [phone]

## 2020-07-21 ENCOUNTER — Other Ambulatory Visit: Payer: Self-pay

## 2020-07-21 ENCOUNTER — Ambulatory Visit (INDEPENDENT_AMBULATORY_CARE_PROVIDER_SITE_OTHER): Payer: Medicare Other | Admitting: Neurology

## 2020-07-21 ENCOUNTER — Encounter: Payer: Self-pay | Admitting: Neurology

## 2020-07-21 VITALS — BP 108/67 | HR 57 | Ht 73.0 in | Wt 232.2 lb

## 2020-07-21 DIAGNOSIS — F0280 Dementia in other diseases classified elsewhere without behavioral disturbance: Secondary | ICD-10-CM

## 2020-07-21 DIAGNOSIS — F03A0 Unspecified dementia, mild, without behavioral disturbance, psychotic disturbance, mood disturbance, and anxiety: Secondary | ICD-10-CM

## 2020-07-21 DIAGNOSIS — F0390 Unspecified dementia without behavioral disturbance: Secondary | ICD-10-CM

## 2020-07-21 DIAGNOSIS — G3101 Pick's disease: Secondary | ICD-10-CM

## 2020-07-21 MED ORDER — DONEPEZIL HCL 10 MG PO TABS: ORAL_TABLET | ORAL | 3 refills | Status: DC

## 2020-07-21 MED ORDER — ESCITALOPRAM OXALATE 10 MG PO TABS
10.0000 mg | ORAL_TABLET | Freq: Every day | ORAL | 3 refills | Status: DC
Start: 2020-07-21 — End: 2021-06-04

## 2020-07-21 MED ORDER — MEMANTINE HCL 10 MG PO TABS: ORAL_TABLET | ORAL | 3 refills | Status: DC

## 2020-07-21 NOTE — Progress Notes (Signed)
NEUROLOGY FOLLOW UP OFFICE NOTE  John Buckley [id number] [birth year]  HISTORY OF PRESENT ILLNESS: I had the pleasure of seeing John Buckley in follow-up in the neurology clinic on 07/21/2020.  The patient was last seen 6 months ago and is accompanied by his wife who helps supplement the history today.  Records and images were personally reviewed where available. Since his last visit, he feels his memory is good. His wife reports memory is a little worse. He has more word-finding difficulties. She started filling his pillbox last week, he is pretty good with taking them independently without forgetting. He continues to drive and denies getting lost, his wife denies any driving concerns. His wife manages finances. He is independent with dressing and bathing. He is on Donepezil 10mg  daily and Memantine 10mg  BID without side effects. Lexapro 10mg  daily has helped, he may lose his temper a little, but not like before. He feels his mood is good. Sleep is good, he takes brief naps. His wife noticed he talks in his sleep, no REM behavior disorder. He denies any headaches, dizziness, vision changes, focal numbness/tingling/weakness, no falls. No hallucinations.    History on Initial Assessment 08/28/2017: This is a pleasant 75 year old right-handed man with a history of hypertension, hyperlipidemia, atrial fibrillation, tobacco use, presenting for evaluation of worsening memory. He feels his memory is "not worth a hoot." He and his wife started noticing changes a little over a year ago. He feels that it seemed to start all of a sudden, he would be having a conversation and could not think of a word in the middle of his sentence. It takes a minute or two to come back. He is misplacing things frequently at home. He continues to drive without getting lost. Most of the bills are on draft, he denies missing bills. He has an alarm for his medications and rarely misses them. His wife first noticed changes when they  were writing Christmas cards together, he was having trouble spelling addresses copying them from their address book. This year they decided to use labels. He has more difficulty concentrating and following programs on TV as easily as he used to. They were driving in [place] last August, which they do every year, but she noticed a change in behavior which was just out of character. They are quite familiar with the city but he became really anxious bout where to turn and what to do. He reports his wife was navigating with her phone and they were using a rental car, which stressed him out. Otherwise at home, she has not noticed similar significant anxiety. He has always had a quick temper and is a little more irritable/cranky now. No paranoia or hallucinations. He is able to bathe and dress independently.   He has brief unsteadiness when he lays on his right side and gets up, with associated nausea. He denies any headaches, diplopia, dysarthria/dysphagia, neck/back pain, focal numbness/tingling/weakness, bladder dysfunction, tremor. He has frequent bowel movements. His sense of smell has been affected by his smoking. His mother and maternal aunt had dementia. He denies any history of significant head injuries. He drinks alcohol occasionally.   Laboratory Data: 11/2016 TSH and B12 normal at PCP office. MRI brain without contrast done 08/2017 which did not show any acute changes. There was moderate diffuse atrophy, mild chronic microvascular disease.  PAST MEDICAL HISTORY: Past Medical History:  Diagnosis Date  . Atrial fibrillation ([place])   . Hyperlipidemia   . Hypertension   .  Long-term (current) use of anticoagulants   . Tobacco abuse     MEDICATIONS: Current Outpatient Medications on File Prior to Visit  Medication Sig Dispense Refill  . amLODipine (NORVASC) 5 MG tablet Take 5 mg by mouth daily.    Marland Kitchen atorvastatin (LIPITOR) 40 MG tablet Take 1 tablet (40 mg total) by mouth daily. 90 tablet 3  .  BETAMETHASONE PO Take by mouth as needed.    . Cholecalciferol (VITAMIN D3) 2000 UNITS capsule Take 2,000 Units by mouth daily.    Marland Kitchen donepezil (ARICEPT) 10 MG tablet Take 1 tablet daily 90 tablet 3  . escitalopram (LEXAPRO) 10 MG tablet Take 1 tablet (10 mg total) by mouth daily. 90 tablet 3  . losartan (COZAAR) 100 MG tablet Take 100 mg by mouth daily.    . memantine (NAMENDA) 10 MG tablet Take 1 tablet twice a day 180 tablet 3  . metoprolol succinate (TOPROL-XL) 50 MG 24 hr tablet Take 1 tablet (50 mg total) by mouth daily. Take with or immediately following a meal. 90 tablet 3  . multivitamin (THERAGRAN) per tablet Take 1 tablet by mouth daily. Takes two per week    . warfarin (JANTOVEN) 5 MG tablet TAKE 1-1.5 tablets AS DIRECTED BY [facility]IN CLINIC 90 tablet 0   No current facility-administered medications on file prior to visit.    ALLERGIES: Allergies  Allergen Reactions  . Lisinopril Cough    FAMILY HISTORY: Family History  Problem Relation Age of Onset  . Alzheimer's disease Mother   . Colon cancer Other        family history of     SOCIAL HISTORY: Social History   Socioeconomic History  . Marital status: Married    Spouse name: Not on file  . Number of children: 2  . Years of education: 6  . Highest education level: Not on file  Occupational History  . Occupation: retired  Tobacco Use  . Smoking status: Former Smoker    Packs/day: 0.50    Years: 39.00    Pack years: 19.50    Types: Cigarettes  . Smokeless tobacco: Never Used  Vaping Use  . Vaping Use: Never used  Substance and Sexual Activity  . Alcohol use: Yes    Comment: once in a while   . Drug use: No  . Sexual activity: Not on file  Other Topics Concern  . Not on file  Social History Narrative   Lives with wife in two story home      Takes frequent walks with dog      Right handed      Highest level of edu- certificate program   Social Determinants of Health   Financial Resource Strain:  Not on file  Food Insecurity: Not on file  Transportation Needs: Not on file  Physical Activity: Not on file  Stress: Not on file  Social Connections: Not on file  Intimate Partner Violence: Not on file     PHYSICAL EXAM: Vitals:   07/21/20 1601  BP: 108/67  Pulse: (!) 57  SpO2: 96%   General: No acute distress Head:  Normocephalic/atraumatic Skin/Extremities: No rash, no edema Neurological Exam: alert and oriented to person, place, month, did not know year "20." No aphasia or dysarthria. Fund of knowledge is appropriate.  Recent and remote memory are impaired, 2/3 delayed recall.  Attention and concentration are reduced, refused to spell WORLD backwards. Able to name pen, unable to name button, described it instead. Cranial nerves: Pupils equal, round.  Extraocular movements intact with no nystagmus. Visual fields full.  No facial asymmetry.  Motor: Bulk and tone normal, muscle strength 5/5 throughout with no pronator drift.   Finger to nose testing intact.  Gait narrow-based and steady, able to tandem walk adequately.  Romberg negative.   IMPRESSION: This is a pleasant 75 yo RH man with a history of hypertension, hyperlipidemia, atrial fibrillation, tobacco use, with mild dementia. 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 His wife reports worsening word-finding issues. Continue Donepezil 10mg  daily and Memantine 10mg  BID. We discussed speech therapy, which he declines. Continue Lexapro 10mg  daily, we may increase dose if needed. Continue control of vascular risk factors, physical exercise, and brain stimulation exercises for brain health. Follow-up in 6-8 months, call for any changes.   Thank you for allowing me to participate in his care.  Please do not hesitate to call for any questions or concerns.   Ellouise Newer, M.D.   CC: Dr.  Laurann Montana

## 2020-07-21 NOTE — Patient Instructions (Signed)
Good to see you! Continue all your medications. Speech therapy is an option if you wish. Follow-up in 6-8 months, call for any changes.   FALL PRECAUTIONS: Be cautious when walking. Scan the area for obstacles that may increase the risk of trips and falls. When getting up in the mornings, sit up at the edge of the bed for a few minutes before getting out of bed. Consider elevating the bed at the head end to avoid drop of blood pressure when getting up. Walk always in a well-lit room (use night lights in the walls). Avoid area rugs or power cords from appliances in the middle of the walkways. Use a walker or a cane if necessary and consider physical therapy for balance exercise. Get your eyesight checked regularly.  FINANCIAL OVERSIGHT: Supervision, especially oversight when making financial decisions or transactions is also recommended.  HOME SAFETY: Consider the safety of the kitchen when operating appliances like stoves, microwave oven, and blender. Consider having supervision and share cooking responsibilities until no longer able to participate in those. Accidents with firearms and other hazards in the house should be identified and addressed as well.  DRIVING: Regarding driving, in patients with progressive memory problems, driving will be impaired. We advise to have someone else do the driving if trouble finding directions or if minor accidents are reported. Independent driving assessment is available to determine safety of driving.  ABILITY TO BE LEFT ALONE: If patient is unable to contact 911 operator, consider using LifeLine, or when the need is there, arrange for someone to stay with patients. Smoking is a fire hazard, consider supervision or cessation. Risk of wandering should be assessed by caregiver and if detected at any point, supervision and safe proof recommendations should be instituted.  MEDICATION SUPERVISION: Inability to self-administer medication needs to be constantly addressed.  Implement a mechanism to ensure safe administration of the medications.  RECOMMENDATIONS FOR ALL PATIENTS WITH MEMORY PROBLEMS: 1. Continue to exercise (Recommend 30 minutes of walking everyday, or 3 hours every week) 2. Increase social interactions - continue going to [place] and enjoy social gatherings with friends and family 3. Eat healthy, avoid fried foods and eat more fruits and vegetables 4. Maintain adequate blood pressure, blood sugar, and blood cholesterol level. Reducing the risk of stroke and cardiovascular disease also helps promoting better memory. 5. Avoid stressful situations. Live a simple life and avoid aggravations. Organize your time and prepare for the next day in anticipation. 6. Sleep well, avoid any interruptions of sleep and avoid any distractions in the bedroom that may interfere with adequate sleep quality 7. Avoid sugar, avoid sweets as there is a strong link between excessive sugar intake, diabetes, and cognitive impairment The Mediterranean diet has been shown to help patients reduce the risk of progressive memory disorders and reduces cardiovascular risk. This includes eating fish, eat fruits and green leafy vegetables, nuts like almonds and hazelnuts, walnuts, and also use olive oil. Avoid fast foods and fried foods as much as possible. Avoid sweets and sugar as sugar use has been linked to worsening of memory function.  There is always a concern of gradual progression of memory problems. If this is the case, then we may need to adjust level of care according to patient needs. Support, both to the patient and caregiver, should then be put into place.

## 2020-07-25 ENCOUNTER — Encounter: Payer: Self-pay | Admitting: Cardiovascular Disease

## 2020-07-25 NOTE — Progress Notes (Signed)
Cardiology Office Note   Date:  07/27/2020   ID:  John Buckley, DOB Oct 13, 1945, MRN [id number]  PCP:  Lavone Orn, MD  Cardiologist:   Mertie Moores, MD   Chief Complaint  Patient presents with  . Hypertension  . Atrial Fibrillation   1. Atrial fibrillation 2. Obesity 3. Hyperlipidemia  4. Chronic ankle relation 5. Hypertension  Previous notes:   John Buckley is a 75 year old gentleman with history of chronic atrial fibrillation, obesity, hyperlipidemia, chronic anticoagulation, hypertension, and ongoing cigarette smoking. He's done very well since I last saw him. Unfortunately he still smokes about a pack of cigarettes a day. He's not had any particular problems.  During his last visit we increased his lisinopril to 20 mg a day which resulted in a well controlled blood pressure. Unfortunately he reduced his dose back down to 10 mg a day because he thought he didn't need the extra dose. He is feeling quite well. He denies any episodes of chest pain or shortness of breath.  July 18, 2012:  John Buckley is doing well. No chest pain, no dyspnea. Getting some exercise.  July 19, 2013:  Feeling well. Exercising some. Still smoking - 1/2 ppd.    August 08, 2014:  John Buckley is a 75 y.o. male who presents for follow-up of his atrial fibrillation. No CP , no dyspnea.  Getting some exercise.   December 05, 2014:   John Buckley is seen today for follow-up of his atrial fibrillation. No chest pains or shortness breath. He's getting a little bit of exercise. Wants to start golfing . Joining silver sneakers   We changed him to Valsartan 160 mg during the last vist. He is tolerating this well.   Feb. 16, 2017:  No cardiac issues.  Had a mass in his right groin  - looked like a bruise,   Popped and has drained. Dr. Laurann Montana has evaluated this  - may have been a cyst  June 29, 2016:  Is doing well Got a new dog recently - Pitbull.  Has lost 27 lbs walking with the dog.     Still in atrial fib   Aug 30, 2017:  John Buckley is seen today for follow-up visit.  He remains in atrial fibrillation.  He has a history of hypertension. Has been losing some weight.   Has been on a better diet  Wt. Is 230 , down from 300 several years ago  Feeling well  , has cut back on bread  Still smoking .   July 21, 2019: John Buckley is seen today  Has lost 15 more lbs. Wt today is 215 lbs.  Has stopped smoking .      July 27, 2020 John Buckley is seen today for follow up of his Atrial fib, HTN, obesity Has developed some somantic dementia .  Wife says he is doing ok .   Has stopped smoking .  Wt today is  230 lbs  Takes his dog to the park .  INR levels look good     Past Medical History:  Diagnosis Date  . Atrial fibrillation ([place])   . Hyperlipidemia   . Hypertension   . Long-term (current) use of anticoagulants   . Tobacco abuse     Past Surgical History:  Procedure Laterality Date  . CARDIAC CATHETERIZATION  01/15/2003   Est. EF is around 65% --  Relatively smooth coronary arteries.  He has a few minor luminal irregularities -- Normal left ventricular systolic function -- Thayer Headings,  M.D.  Marland Kitchen CARDIOVERSION  08/04/20006   was successful  . COLONOSCOPY W/ POLYPECTOMY  12/02/2003   A moderate-sized rectal polyp at 15 cm -- ohn C. Amedeo Plenty, M.D.  . NOSE SURGERY    . TONSILLECTOMY       Current Outpatient Medications  Medication Sig Dispense Refill  . amLODipine (NORVASC) 5 MG tablet Take 5 mg by mouth daily.    Marland Kitchen atorvastatin (LIPITOR) 40 MG tablet Take 1 tablet (40 mg total) by mouth daily. 90 tablet 3  . BETAMETHASONE PO Take by mouth as needed.    . Cholecalciferol (VITAMIN D3) 2000 UNITS capsule Take 2,000 Units by mouth daily.    Marland Kitchen donepezil (ARICEPT) 10 MG tablet Take 1 tablet daily 90 tablet 3  . escitalopram (LEXAPRO) 10 MG tablet Take 1 tablet (10 mg total) by mouth daily. 90 tablet 3  . losartan (COZAAR) 100 MG tablet Take 100 mg by mouth daily.    .  memantine (NAMENDA) 10 MG tablet Take 1 tablet twice a day 180 tablet 3  . metoprolol succinate (TOPROL XL) 25 MG 24 hr tablet Take 1 tablet (25 mg total) by mouth daily. 90 tablet 3  . multivitamin (THERAGRAN) per tablet Take 1 tablet by mouth daily. Takes two per week    . warfarin (JANTOVEN) 5 MG tablet TAKE 1-1.5 tablets AS DIRECTED BY [facility]ADIN CLINIC 90 tablet 0   No current facility-administered medications for this visit.    Allergies:   Lisinopril    Social History:  The patient  reports that he has quit smoking. His smoking use included cigarettes. He has a 19.50 pack-year smoking history. He has never used smokeless tobacco. He reports current alcohol use. He reports that he does not use drugs.   Family History:  The patient's family history includes Alzheimer's disease in his mother; Colon cancer in an other family member.    ROS: Noted in current history, otherwise review of systems is negative.   Physical Exam: Blood pressure 112/74, pulse (!) 48, height 6\' 1"  (1.854 m), weight 230 lb (104.3 kg), SpO2 96 %.  GEN:  Well nourished, well developed in no acute distress HEENT: Normal NECK: No JVD; No carotid bruits LYMPHATICS: No lymphadenopathy CARDIAC: Irreg. Irreg.  Hr is slow  RESPIRATORY:  Clear to auscultation without rales, wheezing or rhonchi  ABDOMEN: Soft, non-tender, non-distended MUSCULOSKELETAL:  No edema; No deformity  SKIN: Warm and dry NEUROLOGIC:   Has developed some dementia     EKG:   July 27, 2020: Atrial fibrillation with a slow ventricular response.  Heart rate is 48.  No ST or T wave changes.  Recent Labs: No results found for requested labs within last 8760 hours.    Lipid Panel    Component Value Date/Time   CHOL 129 08/30/2017 1019   TRIG 149 08/30/2017 1019   HDL 46 08/30/2017 1019   CHOLHDL 2.8 08/30/2017 1019   CHOLHDL 3 05/04/2011 1001   VLDL 35.0 05/04/2011 1001   LDLCALC 53 08/30/2017 1019      Wt Readings from Last 3  Encounters:  07/27/20 230 lb (104.3 kg)  07/21/20 232 lb 3.2 oz (105.3 kg)  01/24/20 217 lb 3.2 oz (98.5 kg)      Other studies Reviewed: Additional studies/ records that were reviewed today include: . Review of the above records demonstrates:    ASSESSMENT AND PLAN:  1.  Atrial fibrillation -   his atrial fibrillation rate is very slow.  We will reduce  his Toprol-XL to 25 mg a day.  His wife will call if his heart rate continues to be slow and we may have to stop it altogether.    2. Obesity -    his weight remains stable.  I encouraged him to get out and walk more.   3. Hyperlipidemia -   lipids were checked by his primary medical doctor.  His lipids look good.  Continue current dose of atorvastatin.   4.  Hypertension-    blood pressure seems to be well controlled.    5.  COPD :   He has stopped smoking.   Current medicines are reviewed at length with the patient today.  The patient does not have concerns regarding medicines.  The following changes have been made:  no change  Labs/ tests ordered today include:   Orders Placed This Encounter  Procedures  . EKG 12-Lead     Disposition:   FU with me in 1 year    Signed, Mertie Moores, MD  07/27/2020 6:05 PM    [address], [place], [place]  [address] Phone: [phone]; Fax: [phone]

## 2020-07-27 ENCOUNTER — Encounter: Payer: Self-pay | Admitting: Cardiovascular Disease

## 2020-07-27 ENCOUNTER — Ambulatory Visit (INDEPENDENT_AMBULATORY_CARE_PROVIDER_SITE_OTHER): Payer: Medicare Other | Admitting: Cardiovascular Disease

## 2020-07-27 ENCOUNTER — Other Ambulatory Visit: Payer: Self-pay

## 2020-07-27 VITALS — BP 112/74 | HR 48 | Ht 73.0 in | Wt 230.0 lb

## 2020-07-27 DIAGNOSIS — I4891 Unspecified atrial fibrillation: Secondary | ICD-10-CM | POA: Diagnosis not present

## 2020-07-27 DIAGNOSIS — E782 Mixed hyperlipidemia: Secondary | ICD-10-CM | POA: Diagnosis not present

## 2020-07-27 MED ORDER — WARFARIN SODIUM 5 MG PO TABS: ORAL_TABLET | ORAL | 0 refills | Status: DC

## 2020-07-27 MED ORDER — METOPROLOL SUCCINATE ER 25 MG PO TB24: 25.0000 mg | ORAL_TABLET | Freq: Every day | ORAL | 3 refills | Status: DC

## 2020-07-27 NOTE — Patient Instructions (Signed)
Medication Instructions:  Your physician has recommended you make the following change in your medication:   DECREASE Toprol xl to 25mg  daily  *If you need a refill on your cardiac medications before your next appointment, please call your pharmacy*   Lab Work: none If you have labs (blood work) drawn today and your tests are completely normal, you will receive your results only by: Marland Kitchen MyChart Message (if you have MyChart) OR . A paper copy in the mail If you have any lab test that is abnormal or we need to change your treatment, we will call you to review the results.   Testing/Procedures: none   Follow-Up: At [facility], you and your health needs are our priority.  As part of our continuing mission to provide you with exceptional heart care, we have created designated Provider Care Teams.  These Care Teams include your primary Cardiologist (physician) and Advanced Practice Providers (APPs -  Physician Assistants and Nurse Practitioners) who all work together to provide you with the care you need, when you need it.     Your next appointment:   1 year(s)  The format for your next appointment:   In Person  Provider:   You may see Mertie Moores, MD or one of the following Advanced Practice Providers on your designated Care Team:    Richardson Dopp, PA-C  [place], [place]

## 2020-08-12 ENCOUNTER — Other Ambulatory Visit: Payer: Self-pay

## 2020-08-12 ENCOUNTER — Ambulatory Visit (INDEPENDENT_AMBULATORY_CARE_PROVIDER_SITE_OTHER): Payer: Medicare Other | Admitting: *Deleted

## 2020-08-12 DIAGNOSIS — I4891 Unspecified atrial fibrillation: Secondary | ICD-10-CM

## 2020-08-12 DIAGNOSIS — Z5181 Encounter for therapeutic drug level monitoring: Secondary | ICD-10-CM

## 2020-08-12 LAB — POCT INR: INR: 5.3 — AB (ref 2.0–3.0)

## 2020-08-12 NOTE — Patient Instructions (Signed)
Description   Hold warfarin today and tomorrow, then START TAKING warfarin 1/2 a tablet daily except for 1 tablet on Sundays and Thursdays. Eat an extra serving of greens today. Recheck INR in 1 week. [facility] [phone].

## 2020-08-20 DIAGNOSIS — E7800 Pure hypercholesterolemia, unspecified: Secondary | ICD-10-CM | POA: Diagnosis not present

## 2020-08-20 DIAGNOSIS — I482 Chronic atrial fibrillation, unspecified: Secondary | ICD-10-CM | POA: Diagnosis not present

## 2020-08-20 DIAGNOSIS — F0390 Unspecified dementia without behavioral disturbance: Secondary | ICD-10-CM | POA: Diagnosis not present

## 2020-08-20 DIAGNOSIS — I10 Essential (primary) hypertension: Secondary | ICD-10-CM | POA: Diagnosis not present

## 2020-08-21 ENCOUNTER — Other Ambulatory Visit: Payer: Self-pay

## 2020-08-21 ENCOUNTER — Ambulatory Visit (INDEPENDENT_AMBULATORY_CARE_PROVIDER_SITE_OTHER): Payer: Medicare Other

## 2020-08-21 DIAGNOSIS — Z5181 Encounter for therapeutic drug level monitoring: Secondary | ICD-10-CM | POA: Diagnosis not present

## 2020-08-21 DIAGNOSIS — I4891 Unspecified atrial fibrillation: Secondary | ICD-10-CM

## 2020-08-21 LAB — POCT INR: INR: 1.7 — AB (ref 2.0–3.0)

## 2020-08-21 NOTE — Patient Instructions (Signed)
-   take extra 1/2 tablet today, then  - resume warfarin 1/2 a tablet daily except for 1 tablet on Sundays and Thursdays.  - Recheck INR in 2 weeks. [facility] [phone].

## 2020-09-02 ENCOUNTER — Ambulatory Visit (INDEPENDENT_AMBULATORY_CARE_PROVIDER_SITE_OTHER): Payer: Medicare Other | Admitting: *Deleted

## 2020-09-02 ENCOUNTER — Other Ambulatory Visit: Payer: Self-pay

## 2020-09-02 DIAGNOSIS — Z5181 Encounter for therapeutic drug level monitoring: Secondary | ICD-10-CM

## 2020-09-02 DIAGNOSIS — I4891 Unspecified atrial fibrillation: Secondary | ICD-10-CM

## 2020-09-02 LAB — POCT INR: INR: 2.4 (ref 2.0–3.0)

## 2020-09-02 NOTE — Patient Instructions (Signed)
Description   Continue taking Warfarin 1/2 tablet daily except for 1 tablet on Sundays and Thursdays. Recheck INR in 3 weeks.  [facility] [phone].

## 2020-09-16 DIAGNOSIS — I10 Essential (primary) hypertension: Secondary | ICD-10-CM | POA: Diagnosis not present

## 2020-09-16 DIAGNOSIS — E7800 Pure hypercholesterolemia, unspecified: Secondary | ICD-10-CM | POA: Diagnosis not present

## 2020-09-16 DIAGNOSIS — F0390 Unspecified dementia without behavioral disturbance: Secondary | ICD-10-CM | POA: Diagnosis not present

## 2020-09-21 DIAGNOSIS — L0390 Cellulitis, unspecified: Secondary | ICD-10-CM | POA: Diagnosis not present

## 2020-09-21 DIAGNOSIS — Z23 Encounter for immunization: Secondary | ICD-10-CM | POA: Diagnosis not present

## 2020-09-23 ENCOUNTER — Other Ambulatory Visit: Payer: Self-pay

## 2020-09-23 ENCOUNTER — Ambulatory Visit (INDEPENDENT_AMBULATORY_CARE_PROVIDER_SITE_OTHER): Payer: Medicare Other

## 2020-09-23 DIAGNOSIS — Z5181 Encounter for therapeutic drug level monitoring: Secondary | ICD-10-CM | POA: Diagnosis not present

## 2020-09-23 DIAGNOSIS — I4891 Unspecified atrial fibrillation: Secondary | ICD-10-CM

## 2020-09-23 LAB — POCT INR: INR: 2.8 (ref 2.0–3.0)

## 2020-09-23 NOTE — Patient Instructions (Signed)
Description   Continue taking Warfarin 1/2 tablet daily except for 1 tablet on Sundays and Thursdays. Recheck INR in 4 weeks.  [facility] [phone].

## 2020-10-21 ENCOUNTER — Ambulatory Visit (INDEPENDENT_AMBULATORY_CARE_PROVIDER_SITE_OTHER): Payer: Medicare Other | Admitting: *Deleted

## 2020-10-21 ENCOUNTER — Other Ambulatory Visit: Payer: Self-pay

## 2020-10-21 DIAGNOSIS — Z5181 Encounter for therapeutic drug level monitoring: Secondary | ICD-10-CM

## 2020-10-21 DIAGNOSIS — I4891 Unspecified atrial fibrillation: Secondary | ICD-10-CM | POA: Diagnosis not present

## 2020-10-21 LAB — POCT INR: INR: 1.7 — AB (ref 2.0–3.0)

## 2020-10-21 NOTE — Patient Instructions (Signed)
Description   Take 1 tablet today and then continue taking Warfarin 1/2 tablet daily except for 1 tablet on Sundays and Thursdays. Recheck INR in 3 weeks.  [facility] [phone].

## 2020-11-11 ENCOUNTER — Ambulatory Visit (INDEPENDENT_AMBULATORY_CARE_PROVIDER_SITE_OTHER): Payer: Medicare Other

## 2020-11-11 ENCOUNTER — Other Ambulatory Visit: Payer: Self-pay

## 2020-11-11 DIAGNOSIS — I4891 Unspecified atrial fibrillation: Secondary | ICD-10-CM

## 2020-11-11 DIAGNOSIS — Z5181 Encounter for therapeutic drug level monitoring: Secondary | ICD-10-CM | POA: Diagnosis not present

## 2020-11-11 LAB — POCT INR: INR: 2.8 (ref 2.0–3.0)

## 2020-11-11 NOTE — Patient Instructions (Signed)
Description   Continue taking Warfarin 1/2 tablet daily except for 1 tablet on Sundays and Thursdays. Recheck INR in 4 weeks.  [facility] [phone].

## 2020-11-19 DIAGNOSIS — M21611 Bunion of right foot: Secondary | ICD-10-CM | POA: Diagnosis not present

## 2020-11-19 DIAGNOSIS — L84 Corns and callosities: Secondary | ICD-10-CM | POA: Diagnosis not present

## 2020-11-19 DIAGNOSIS — M21612 Bunion of left foot: Secondary | ICD-10-CM | POA: Diagnosis not present

## 2020-11-19 DIAGNOSIS — M792 Neuralgia and neuritis, unspecified: Secondary | ICD-10-CM | POA: Diagnosis not present

## 2020-11-19 DIAGNOSIS — M79675 Pain in left toe(s): Secondary | ICD-10-CM | POA: Diagnosis not present

## 2020-11-19 DIAGNOSIS — B351 Tinea unguium: Secondary | ICD-10-CM | POA: Diagnosis not present

## 2020-11-19 DIAGNOSIS — I739 Peripheral vascular disease, unspecified: Secondary | ICD-10-CM | POA: Diagnosis not present

## 2020-12-09 ENCOUNTER — Other Ambulatory Visit: Payer: Self-pay

## 2020-12-09 ENCOUNTER — Ambulatory Visit (INDEPENDENT_AMBULATORY_CARE_PROVIDER_SITE_OTHER): Payer: Medicare Other | Admitting: *Deleted

## 2020-12-09 DIAGNOSIS — Z5181 Encounter for therapeutic drug level monitoring: Secondary | ICD-10-CM | POA: Diagnosis not present

## 2020-12-09 DIAGNOSIS — I4891 Unspecified atrial fibrillation: Secondary | ICD-10-CM | POA: Diagnosis not present

## 2020-12-09 LAB — POCT INR: INR: 3.1 — AB (ref 2.0–3.0)

## 2020-12-09 NOTE — Patient Instructions (Addendum)
Description   Since you have taken today's dose, take 1/2 tablet tomorrow, then continue taking Warfarin 1/2 tablet daily except for 1 tablet on Sundays and Thursdays. Recheck INR in 4 weeks.  [facility] [phone].

## 2020-12-24 DIAGNOSIS — I10 Essential (primary) hypertension: Secondary | ICD-10-CM | POA: Diagnosis not present

## 2020-12-24 DIAGNOSIS — E7800 Pure hypercholesterolemia, unspecified: Secondary | ICD-10-CM | POA: Diagnosis not present

## 2020-12-24 DIAGNOSIS — F0390 Unspecified dementia without behavioral disturbance: Secondary | ICD-10-CM | POA: Diagnosis not present

## 2020-12-28 DIAGNOSIS — E7800 Pure hypercholesterolemia, unspecified: Secondary | ICD-10-CM | POA: Diagnosis not present

## 2020-12-28 DIAGNOSIS — Z1389 Encounter for screening for other disorder: Secondary | ICD-10-CM | POA: Diagnosis not present

## 2020-12-28 DIAGNOSIS — I10 Essential (primary) hypertension: Secondary | ICD-10-CM | POA: Diagnosis not present

## 2020-12-28 DIAGNOSIS — F0390 Unspecified dementia without behavioral disturbance: Secondary | ICD-10-CM | POA: Diagnosis not present

## 2020-12-28 DIAGNOSIS — Z86010 Personal history of colonic polyps: Secondary | ICD-10-CM | POA: Diagnosis not present

## 2020-12-28 DIAGNOSIS — I482 Chronic atrial fibrillation, unspecified: Secondary | ICD-10-CM | POA: Diagnosis not present

## 2020-12-28 DIAGNOSIS — Z0000 Encounter for general adult medical examination without abnormal findings: Secondary | ICD-10-CM | POA: Diagnosis not present

## 2021-01-06 ENCOUNTER — Other Ambulatory Visit: Payer: Self-pay

## 2021-01-06 ENCOUNTER — Ambulatory Visit (INDEPENDENT_AMBULATORY_CARE_PROVIDER_SITE_OTHER): Payer: Medicare Other | Admitting: *Deleted

## 2021-01-06 DIAGNOSIS — Z5181 Encounter for therapeutic drug level monitoring: Secondary | ICD-10-CM

## 2021-01-06 DIAGNOSIS — I4891 Unspecified atrial fibrillation: Secondary | ICD-10-CM

## 2021-01-06 LAB — POCT INR: INR: 2.2 (ref 2.0–3.0)

## 2021-01-06 NOTE — Patient Instructions (Signed)
Description   Continue taking Warfarin 1/2 tablet daily except for 1 tablet on Sundays and Thursdays. Recheck INR in 4 weeks.  [facility] [phone].

## 2021-01-07 DIAGNOSIS — I739 Peripheral vascular disease, unspecified: Secondary | ICD-10-CM | POA: Diagnosis not present

## 2021-01-26 DIAGNOSIS — Z23 Encounter for immunization: Secondary | ICD-10-CM | POA: Diagnosis not present

## 2021-02-01 DIAGNOSIS — Z23 Encounter for immunization: Secondary | ICD-10-CM | POA: Diagnosis not present

## 2021-02-03 ENCOUNTER — Ambulatory Visit (INDEPENDENT_AMBULATORY_CARE_PROVIDER_SITE_OTHER): Payer: Medicare Other | Admitting: *Deleted

## 2021-02-03 ENCOUNTER — Other Ambulatory Visit: Payer: Self-pay

## 2021-02-03 DIAGNOSIS — Z5181 Encounter for therapeutic drug level monitoring: Secondary | ICD-10-CM

## 2021-02-03 DIAGNOSIS — I4891 Unspecified atrial fibrillation: Secondary | ICD-10-CM | POA: Diagnosis not present

## 2021-02-03 LAB — POCT INR: INR: 2.4 (ref 2.0–3.0)

## 2021-02-03 NOTE — Patient Instructions (Signed)
Description   Continue taking Warfarin 1/2 tablet daily except for 1 tablet on Sundays and Thursdays. Recheck INR in 6 weeks.  [facility] [phone].

## 2021-03-04 DIAGNOSIS — D492 Neoplasm of unspecified behavior of bone, soft tissue, and skin: Secondary | ICD-10-CM | POA: Diagnosis not present

## 2021-03-04 DIAGNOSIS — R58 Hemorrhage, not elsewhere classified: Secondary | ICD-10-CM | POA: Diagnosis not present

## 2021-03-04 DIAGNOSIS — Z08 Encounter for follow-up examination after completed treatment for malignant neoplasm: Secondary | ICD-10-CM | POA: Diagnosis not present

## 2021-03-04 DIAGNOSIS — Z85828 Personal history of other malignant neoplasm of skin: Secondary | ICD-10-CM | POA: Diagnosis not present

## 2021-03-04 DIAGNOSIS — L819 Disorder of pigmentation, unspecified: Secondary | ICD-10-CM | POA: Diagnosis not present

## 2021-03-04 DIAGNOSIS — B079 Viral wart, unspecified: Secondary | ICD-10-CM | POA: Diagnosis not present

## 2021-03-16 ENCOUNTER — Other Ambulatory Visit: Payer: Self-pay | Admitting: Cardiovascular Disease

## 2021-03-16 DIAGNOSIS — F0390 Unspecified dementia without behavioral disturbance: Secondary | ICD-10-CM | POA: Diagnosis not present

## 2021-03-16 DIAGNOSIS — E7800 Pure hypercholesterolemia, unspecified: Secondary | ICD-10-CM | POA: Diagnosis not present

## 2021-03-16 DIAGNOSIS — I10 Essential (primary) hypertension: Secondary | ICD-10-CM | POA: Diagnosis not present

## 2021-03-17 ENCOUNTER — Other Ambulatory Visit: Payer: Self-pay

## 2021-03-17 ENCOUNTER — Ambulatory Visit (INDEPENDENT_AMBULATORY_CARE_PROVIDER_SITE_OTHER): Payer: Medicare Other | Admitting: *Deleted

## 2021-03-17 DIAGNOSIS — I4891 Unspecified atrial fibrillation: Secondary | ICD-10-CM | POA: Diagnosis not present

## 2021-03-17 DIAGNOSIS — Z5181 Encounter for therapeutic drug level monitoring: Secondary | ICD-10-CM | POA: Diagnosis not present

## 2021-03-17 LAB — POCT INR: INR: 2.8 (ref 2.0–3.0)

## 2021-03-17 NOTE — Patient Instructions (Signed)
Description   Continue taking Warfarin 1/2 tablet daily except for 1 tablet on Sundays and Thursdays. Recheck INR in 6 weeks.  [facility] [phone].

## 2021-04-15 ENCOUNTER — Other Ambulatory Visit: Payer: Self-pay

## 2021-04-15 ENCOUNTER — Encounter: Payer: Self-pay | Admitting: Neurology

## 2021-04-15 ENCOUNTER — Ambulatory Visit (INDEPENDENT_AMBULATORY_CARE_PROVIDER_SITE_OTHER): Payer: Medicare Other | Admitting: Neurology

## 2021-04-15 VITALS — BP 121/77 | HR 56 | Ht 73.0 in | Wt 248.2 lb

## 2021-04-15 DIAGNOSIS — F0280 Dementia in other diseases classified elsewhere without behavioral disturbance: Secondary | ICD-10-CM

## 2021-04-15 DIAGNOSIS — G3101 Pick's disease: Secondary | ICD-10-CM | POA: Diagnosis not present

## 2021-04-15 NOTE — Progress Notes (Signed)
NEUROLOGY FOLLOW UP OFFICE NOTE  BROADUS Buckley [id number] October 09, 1945  HISTORY OF PRESENT ILLNESS: I had the pleasure of seeing John Buckley in follow-up in the neurology clinic on 04/15/2021.  The patient was last seen 8 months ago for dementia. He is again accompanied by his wife who helps supplement the history today.  Records and images were personally reviewed where available.  He is on Donepezil 10mg  daily and Memantine 10mg  BID, Lexapro 10mg  daily has helped with mood. No side effects. When asked about memory, he states "I would not say it is too good." His wife shakes her head and reports that the main thing they have noticed the most is he has lost a lot of words. He is more unsure of himself, he usually makes his coffee in the morning but if he gets out of routine, he gets more confused. Twice he has put half and half in the coffee pot. He drives occasionally and denies getting lost. His wife fills his pillbox, yesterday he forgot his medications, she usually checks behind him. She manages finances. He is independent with dressing and bathing. Sleep is good. Appetite is good, he has gained weight with a compulsion to eat more sweets. Mood is upbeat, but he is getting more frustrated easily and gets angry and walks off. He was getting upset in the office today as we discussed driving evaluation. He denies any headaches, dizziness, focal numbness/tingling/weakness, no falls. No hallucinations or paranoia.   History on Initial Assessment 08/28/2017: This is a pleasant 75 year old right-handed man with a history of hypertension, hyperlipidemia, atrial fibrillation, tobacco use, presenting for evaluation of worsening memory. He feels his memory is "not worth a hoot." He and his wife started noticing changes a little over a year ago. He feels that it seemed to start all of a sudden, he would be having a conversation and could not think of a word in the middle of his sentence. It takes a minute or  two to come back. He is misplacing things frequently at home. He continues to drive without getting lost. Most of the bills are on draft, he denies missing bills. He has an alarm for his medications and rarely misses them. His wife first noticed changes when they were writing Christmas cards together, he was having trouble spelling addresses copying them from their address book. This year they decided to use labels. He has more difficulty concentrating and following programs on TV as easily as he used to. They were driving in [place] last August, which they do every year, but she noticed a change in behavior which was just out of character. They are quite familiar with the city but he became really anxious bout where to turn and what to do. He reports his wife was navigating with her phone and they were using a rental car, which stressed him out. Otherwise at home, she has not noticed similar significant anxiety. He has always had a quick temper and is a little more irritable/cranky now. No paranoia or hallucinations. He is able to bathe and dress independently.    He has brief unsteadiness when he lays on his right side and gets up, with associated nausea. He denies any headaches, diplopia, dysarthria/dysphagia, neck/back pain, focal numbness/tingling/weakness, bladder dysfunction, tremor. He has frequent bowel movements. His sense of smell has been affected by his smoking. His mother and maternal aunt had dementia. He denies any history of significant head injuries. He drinks alcohol occasionally.  Laboratory Data: 11/2016 TSH and B12 normal at PCP office. MRI brain without contrast done 08/2017 which did not show any acute changes. There was moderate diffuse atrophy, mild chronic microvascular disease. 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   PAST MEDICAL  HISTORY: Past Medical History:  Diagnosis Date   Atrial fibrillation ([place])    Hyperlipidemia    Hypertension    Long-term (current) use of anticoagulants    Tobacco abuse     MEDICATIONS: Current Outpatient Medications on File Prior to Visit  Medication Sig Dispense Refill   amLODipine (NORVASC) 5 MG tablet Take 5 mg by mouth daily.     atorvastatin (LIPITOR) 40 MG tablet Take 1 tablet (40 mg total) by mouth daily. 90 tablet 3   BETAMETHASONE PO Take by mouth as needed.     Cholecalciferol (VITAMIN D3) 2000 UNITS capsule Take 2,000 Units by mouth daily.     donepezil (ARICEPT) 10 MG tablet Take 1 tablet daily 90 tablet 3   escitalopram (LEXAPRO) 10 MG tablet Take 1 tablet (10 mg total) by mouth daily. 90 tablet 3   losartan (COZAAR) 100 MG tablet Take 100 mg by mouth daily.     memantine (NAMENDA) 10 MG tablet Take 1 tablet twice a day 180 tablet 3   metoprolol succinate (TOPROL XL) 25 MG 24 hr tablet Take 1 tablet (25 mg total) by mouth daily. 90 tablet 3   multivitamin (THERAGRAN) per tablet Take 1 tablet by mouth daily. Takes two per week     warfarin (COUMADIN) 5 MG tablet TAKE 1/2 TO 1 TABLET ONCE DAILY AS DIRECTED BY [facility] 90 tablet 1   No current facility-administered medications on file prior to visit.    ALLERGIES: Allergies  Allergen Reactions   Lisinopril Cough    FAMILY HISTORY: Family History  Problem Relation Age of Onset   Alzheimer's disease Mother    Colon cancer Other        family history of     SOCIAL HISTORY: Social History   Socioeconomic History   Marital status: Married    Spouse name: Not on file   Number of children: 2   Years of education: 14   Highest education level: Not on file  Occupational History   Occupation: retired  Tobacco Use   Smoking status: Former    Packs/day: 0.50    Years: 39.00    Pack years: 19.50    Types: Cigarettes   Smokeless tobacco: Never  Vaping Use   Vaping Use: Never used  Substance and  Sexual Activity   Alcohol use: Yes    Comment: once in a while    Drug use: No   Sexual activity: Not on file  Other Topics Concern   Not on file  Social History Narrative   Lives with wife in two story home      Takes frequent walks with dog      Right handed      Highest level of edu- certificate program   Social Determinants of Health   Financial Resource Strain: Not on file  Food Insecurity: Not on file  Transportation Needs: Not on file  Physical Activity: Not on file  Stress: Not on file  Social Connections: Not on file  Intimate Partner Violence: Not on file     PHYSICAL EXAM: Vitals:   04/15/21 1119  BP: 121/77  Pulse: Marland Kitchen)  56  SpO2: 96%   General: No acute distress Head:  Normocephalic/atraumatic Skin/Extremities: No rash, no edema Neurological Exam: alert and oriented to person. He has significant word-finding difficulties, unable to give month/year, city/state, and getting frustrated. He is unable to name objects, describing them instead. He is able to repeat. Fund of knowledge is reduced. Unable to spell WORLD backwards, 2/5. MMSE 10/30 MMSE - Mini Mental State Exam 04/15/2021 03/20/2018  Orientation to time 0 4  Orientation to Place 0 5  Registration 3 3  Attention/ Calculation 2 3  Recall 0 2  Language- name 2 objects 0 2  Language- repeat 1 1  Language- follow 3 step command 2 3  Language- read & follow direction 1 1  Write a sentence 0 1  Copy design 1 1  Total score 10 26   Cranial nerves: Pupils equal, round. Extraocular movements intact with no nystagmus. Visual fields full.  No facial asymmetry.  Motor: Bulk and tone normal, muscle strength 5/5 throughout with no pronator drift.   Finger to nose testing intact.  Gait narrow-based and steady, able to tandem walk adequately.  Romberg negative.   IMPRESSION: This is a pleasant 76 yo RH man with a history of hypertension, hyperlipidemia, atrial fibrillation, tobacco use, with mild dementia.  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 He is having more expressive aphasia today, MMSE 10/30 (26/30 in 2019). I expressed concerns with patient and wife, particularly with driving and dementia. Recommend driving evaluation, resources provided. Continue Donepezil 10mg  daily and Memantine 10mg  BID. We may consider increasing Lexapro 10mg  daily in the future. Follow-up in 6 months with Memory Disorders PA John Buckley, call for any changes.    Thank you for allowing me to participate in his care.  Please do not hesitate to call for any questions or concerns.    Ellouise Newer, M.D.   CC: Dr. Laurann Montana

## 2021-04-15 NOTE — Patient Instructions (Addendum)
Good to see you.  Continue Donepezil 10mg  daily and Memantine 10mg  twice a day, and Lexapro 10mg  daily. Let us know where to send refills.  2. Please have a driving evaluation done, these are the options:  [employer] in [place]  [place]  [facility] [phone] or [phone]  3. After the holidays settle and mood changes still bothersome, please call our office and we can increase Lexapro dose  4. Follow-up in 6 months, call for any changes   FALL PRECAUTIONS: Be cautious when walking. Scan the area for obstacles that may increase the risk of trips and falls. When getting up in the mornings, sit up at the edge of the bed for a few minutes before getting out of bed. Consider elevating the bed at the head end to avoid drop of blood pressure when getting up. Walk always in a well-lit room (use night lights in the walls). Avoid area rugs or power cords from appliances in the middle of the walkways. Use a walker or a cane if necessary and consider physical therapy for balance exercise. Get your eyesight checked regularly.  FINANCIAL OVERSIGHT: Supervision, especially oversight when making financial decisions or transactions is also recommended.  HOME SAFETY: Consider the safety of the kitchen when operating appliances like stoves, microwave oven, and blender. Consider having supervision and share cooking responsibilities until no longer able to participate in those. Accidents with firearms and other hazards in the house should be identified and addressed as well.  DRIVING: Regarding driving, in patients with progressive memory problems, driving will be impaired. We advise to have someone else do the driving if trouble finding directions or if minor accidents are reported. Independent driving assessment is available to determine safety of driving.  ABILITY TO BE LEFT ALONE: If patient is  unable to contact 911 operator, consider using LifeLine, or when the need is there, arrange for someone to stay with patients. Smoking is a fire hazard, consider supervision or cessation. Risk of wandering should be assessed by caregiver and if detected at any point, supervision and safe proof recommendations should be instituted.  MEDICATION SUPERVISION: Inability to self-administer medication needs to be constantly addressed. Implement a mechanism to ensure safe administration of the medications.  RECOMMENDATIONS FOR ALL PATIENTS WITH MEMORY PROBLEMS: 1. Continue to exercise (Recommend 30 minutes of walking everyday, or 3 hours every week) 2. Increase social interactions - continue going to [place] and enjoy social gatherings with friends and family 3. Eat healthy, avoid fried foods and eat more fruits and vegetables 4. Maintain adequate blood pressure, blood sugar, and blood cholesterol level. Reducing the risk of stroke and cardiovascular disease also helps promoting better memory. 5. Avoid stressful situations. Live a simple life and avoid aggravations. Organize your time and prepare for the next day in anticipation. 6. Sleep well, avoid any interruptions of sleep and avoid any distractions in the bedroom that may interfere with adequate sleep quality 7. Avoid sugar, avoid sweets as there is a strong link between excessive sugar intake, diabetes, and cognitive impairment We discussed the Mediterranean diet, which has been shown to help patients reduce the risk of progressive memory disorders and reduces cardiovascular risk. This includes eating fish, eat fruits and green leafy vegetables, nuts like almonds and hazelnuts, walnuts, and also use olive oil. Avoid fast foods and fried foods as much as possible. Avoid sweets and sugar as sugar use has been linked to worsening of memory function.  There is always a concern of gradual progression of memory problems. If this is the case, then we may need to  adjust level of care according to patient needs. Support, both to the patient and caregiver, should then be put into place.

## 2021-04-20 ENCOUNTER — Ambulatory Visit: Payer: Medicare Other | Admitting: Neurology

## 2021-04-26 ENCOUNTER — Telehealth: Payer: Self-pay | Admitting: Neurology

## 2021-04-26 DIAGNOSIS — G3184 Mild cognitive impairment, so stated: Secondary | ICD-10-CM

## 2021-04-26 DIAGNOSIS — F0280 Dementia in other diseases classified elsewhere without behavioral disturbance: Secondary | ICD-10-CM

## 2021-04-26 NOTE — Telephone Encounter (Signed)
Patients wife called in, she said there was a misunderstanding, they do want to proceed with speech therapy. Would like a call to discuss this

## 2021-04-26 NOTE — Telephone Encounter (Signed)
Pt called to see why they did not want to do speech therapy no answer left a voice mail to call the office back ,

## 2021-04-26 NOTE — Telephone Encounter (Signed)
Pt's wife called back in returning Heather's call

## 2021-04-27 NOTE — Telephone Encounter (Signed)
Called pt wife back to get more information she was unable to talk she was at the vets office, she will call the office back when she gets home and has time to talk ,

## 2021-04-28 ENCOUNTER — Ambulatory Visit (INDEPENDENT_AMBULATORY_CARE_PROVIDER_SITE_OTHER): Payer: Medicare Other | Admitting: *Deleted

## 2021-04-28 ENCOUNTER — Other Ambulatory Visit: Payer: Self-pay

## 2021-04-28 DIAGNOSIS — Z5181 Encounter for therapeutic drug level monitoring: Secondary | ICD-10-CM

## 2021-04-28 DIAGNOSIS — I4891 Unspecified atrial fibrillation: Secondary | ICD-10-CM | POA: Diagnosis not present

## 2021-04-28 LAB — POCT INR: INR: 2.3 (ref 2.0–3.0)

## 2021-04-28 NOTE — Telephone Encounter (Signed)
Spoke with John Buckley Mr Rosato would like to do Home ST if you are ok with me ordering this I will place order in Epic

## 2021-04-28 NOTE — Patient Instructions (Signed)
Description   Continue taking Warfarin 1/2 tablet daily except for 1 tablet on Sundays and Thursdays. Recheck INR in 6 weeks.  [facility] [phone].

## 2021-04-29 NOTE — Telephone Encounter (Signed)
Order placed for home health speech therapy

## 2021-04-29 NOTE — Telephone Encounter (Signed)
That is fine, thanks 

## 2021-05-18 ENCOUNTER — Encounter: Payer: Self-pay | Admitting: Neurology

## 2021-05-18 ENCOUNTER — Other Ambulatory Visit: Payer: Self-pay

## 2021-05-18 MED ORDER — MEMANTINE HCL 10 MG PO TABS: ORAL_TABLET | ORAL | 0 refills | Status: DC

## 2021-05-20 ENCOUNTER — Telehealth: Payer: Self-pay | Admitting: Neurology

## 2021-05-20 NOTE — Telephone Encounter (Signed)
Patient's wife called to follow up on a referral to speech therapy from three weeks ago, they've not heard anything.

## 2021-05-21 NOTE — Telephone Encounter (Signed)
Pt wife called no answer left a voice mail per DPR that we are waiting for insurance to approve therapy

## 2021-05-21 NOTE — Telephone Encounter (Signed)
Waiting to see if insurance will approve it

## 2021-06-02 DIAGNOSIS — L84 Corns and callosities: Secondary | ICD-10-CM | POA: Diagnosis not present

## 2021-06-02 DIAGNOSIS — B351 Tinea unguium: Secondary | ICD-10-CM | POA: Diagnosis not present

## 2021-06-02 DIAGNOSIS — M792 Neuralgia and neuritis, unspecified: Secondary | ICD-10-CM | POA: Diagnosis not present

## 2021-06-02 DIAGNOSIS — I739 Peripheral vascular disease, unspecified: Secondary | ICD-10-CM | POA: Diagnosis not present

## 2021-06-02 DIAGNOSIS — L603 Nail dystrophy: Secondary | ICD-10-CM | POA: Diagnosis not present

## 2021-06-04 ENCOUNTER — Other Ambulatory Visit: Payer: Self-pay

## 2021-06-04 ENCOUNTER — Encounter: Payer: Self-pay | Admitting: Neurology

## 2021-06-04 DIAGNOSIS — G3101 Pick's disease: Secondary | ICD-10-CM

## 2021-06-04 DIAGNOSIS — F03A0 Unspecified dementia, mild, without behavioral disturbance, psychotic disturbance, mood disturbance, and anxiety: Secondary | ICD-10-CM

## 2021-06-04 DIAGNOSIS — F0280 Dementia in other diseases classified elsewhere without behavioral disturbance: Secondary | ICD-10-CM

## 2021-06-04 DIAGNOSIS — G3184 Mild cognitive impairment, so stated: Secondary | ICD-10-CM

## 2021-06-04 MED ORDER — DONEPEZIL HCL 10 MG PO TABS: ORAL_TABLET | ORAL | 2 refills | Status: DC

## 2021-06-04 MED ORDER — ESCITALOPRAM OXALATE 10 MG PO TABS: 10.0000 mg | ORAL_TABLET | Freq: Every day | ORAL | 2 refills | Status: DC

## 2021-06-09 ENCOUNTER — Other Ambulatory Visit: Payer: Self-pay

## 2021-06-09 ENCOUNTER — Ambulatory Visit (INDEPENDENT_AMBULATORY_CARE_PROVIDER_SITE_OTHER): Payer: Medicare Other | Admitting: *Deleted

## 2021-06-09 DIAGNOSIS — Z5181 Encounter for therapeutic drug level monitoring: Secondary | ICD-10-CM | POA: Diagnosis not present

## 2021-06-09 DIAGNOSIS — I4891 Unspecified atrial fibrillation: Secondary | ICD-10-CM

## 2021-06-09 LAB — POCT INR: INR: 2.5 (ref 2.0–3.0)

## 2021-06-09 NOTE — Patient Instructions (Signed)
Description   Continue taking Warfarin 1/2 tablet daily except for 1 tablet on Sundays and Thursdays. Recheck INR in 6 weeks.  [facility] [id number].

## 2021-06-10 ENCOUNTER — Ambulatory Visit: Payer: Medicare Other | Attending: Neurology

## 2021-06-10 DIAGNOSIS — R4701 Aphasia: Secondary | ICD-10-CM | POA: Diagnosis not present

## 2021-06-10 DIAGNOSIS — R41841 Cognitive communication deficit: Secondary | ICD-10-CM | POA: Insufficient documentation

## 2021-06-10 NOTE — Therapy (Signed)
[facility] [address] [place], [place], [address] Phone: [phone]   Fax:  [phone]  Speech Language Pathology Evaluation  Patient Details  Name: John Buckley MRN: [id number] Date of Birth: [birth year] Referring Provider (SLP): Dr. Ellouise Newer   Encounter Date: 06/10/2021   End of Session - 06/10/21 1455     Visit Number 1    Number of Visits 25    Date for SLP Re-Evaluation 09/10/21    Authorization Type medicare    SLP Start Time 5361    SLP Stop Time  4431    SLP Time Calculation (min) 45 min    Activity Tolerance Patient tolerated treatment well             Past Medical History:  Diagnosis Date   Atrial fibrillation ([place])    Hyperlipidemia    Hypertension    Long-term (current) use of anticoagulants    Tobacco abuse     Past Surgical History:  Procedure Laterality Date   CARDIAC CATHETERIZATION  01/15/2003   Est. EF is around 65% --  Relatively smooth coronary arteries.  He has a few minor luminal irregularities -- Normal left ventricular systolic function -- Thayer Headings, M.D.   CARDIOVERSION  08/04/20006   was successful   COLONOSCOPY W/ POLYPECTOMY  12/02/2003   A moderate-sized rectal polyp at 15 cm -- ohn C. Amedeo Plenty, M.D.   NOSE SURGERY     TONSILLECTOMY      There were no vitals filed for this visit.   Subjective Assessment - 06/10/21 1449     Subjective "she says I'm having trouble" (points to wife) when asked how he's doing    Currently in Pain? No/denies                SLP Evaluation [facility] - 06/10/21 1449       SLP Visit Information   SLP Received On 06/10/21    Referring Provider (SLP) Dr. Ellouise Newer    Onset Date 2018    Medical Diagnosis Primary Progressive Aphasia      Subjective   Patient/Family Stated Goal to improve communication      General Information   HPI The patient is a 76 yo male who has demonstrated decline in cognition since 2018  and decline in language since 2021. Pt was initially diagnosed with dementia and more recently diagnosed with Primary Progressive Aphasia (PPA). Per neurologist note: He is on Donepezil 10mg  daily and Memantine 10mg  BID, Lexapro 10mg  daily has helped with mood. No side effects. When asked about memory, he states "I would not say it is too good." His wife shakes her head and reports that the main thing they have noticed the most is he has lost a lot of words. He is more unsure of himself, he usually makes his coffee in the morning but if he gets out of routine, he gets more confused. Twice he has put half and half in the coffee pot. He drives occasionally and denies getting lost. His wife fills his pillbox, yesterday he forgot his medications, she usually checks behind him. She manages finances. He is independent with dressing and bathing. Sleep is good. Appetite is good, he has gained weight with a compulsion to eat more sweets. Mood is upbeat, but he is getting more frustrated easily and gets angry and walks off. He was getting upset in the office today as we discussed driving evaluation. He denies any headaches, dizziness, focal numbness/tingling/weakness, no  falls. No hallucinations or paranoia.    Behavioral/Cognition reduced awareness of deficits    Mobility Status ambulates w/o assistance      Balance Screen   Has the patient fallen in the past 6 months No    Has the patient had a decrease in activity level because of a fear of falling?  No    Is the patient reluctant to leave their home because of a fear of falling?  No      Prior Functional Status   Type of Home House     Lives With Spouse    Available Support Family    Vocation Retired      Associate Professor   Overall Cognitive Status Impaired/Different from baseline   not formally assessed this date   Area of Impairment Orientation;Memory;Safety/judgement;Awareness    Orientation Level Disoriented to;Place;Time;Situation    Memory Decreased  short-term memory    Memory Comments reduced recall of personal information, recent events    Safety/Judgement Decreased awareness of deficits;Decreased awareness of safety    Awareness Intellectual;Emergent;Anticipatory    Problem Solving Slow processing;Difficulty sequencing;Requires verbal cues      Auditory Comprehension   Overall Auditory Comprehension Impaired    Yes/No Questions Impaired    Basic Biographical Questions 51-75% accurate    Basic Immediate Environment Questions 50-74% accurate    Complex Questions 0-24% accurate    Commands Within Functional Limits    Conversation Simple    Other Conversation Comments requires repition and rephrasing intermittently    Interfering Components Processing speed;Attention    EffectiveTechniques Pausing;Repetition;Slowed speech;Visual/Gestural cues;Extra processing time      Reading Comprehension   Reading Status --    Effective Techniques --      Expression   Primary Mode of Expression Verbal      Verbal Expression   Overall Verbal Expression Impaired    Initiation No impairment    Automatic Speech Counting    Level of Generative/Spontaneous Verbalization Conversation    Repetition No impairment    Naming Impairment    Confrontation 0-24% accurate    Verbal Errors Semantic paraphasias;Phonemic paraphasias    Pragmatics No impairment    Effective Techniques Phonemic cues   inconsistent effectiveness   Non-Verbal Means of Communication Gestures    Other Verbal Expression Comments circumlocution      Oral Motor/Sensory Function   Overall Oral Motor/Sensory Function Appears within functional limits for tasks assessed      Motor Speech   Overall Motor Speech Appears within functional limits for tasks assessed      Standardized Assessments   Standardized Assessments  Other Assessment   Quick Aphasia Battery (QAB) - 5.86 moderate severity                            SLP Education - 06/10/21 1554      Education Details eval results, POC, possible goals    Person(s) Educated Patient;Spouse    Methods Explanation;Demonstration    Comprehension Verbalized understanding;Need further instruction              SLP Short Term Goals - 06/10/21 1604       SLP SHORT TERM GOAL #1   Title Care partner will demonstrate appropriate cuing techniques with occasional min A across 2 sessions    Time 4    Period Weeks    Status New      SLP SHORT TERM GOAL #2   Title Pt will self-identify  episodes of anomia or dysnomia with 50% accuracy on structured speech tasks with occasional mod A over 2 sessions    Time 4    Period Weeks    Status New      SLP SHORT TERM GOAL #3   Title pt will use mutlimodal communication to support verbal expression (gestures, writing, drawing, picture symbols) 3/5 opportunities with occasional mod A across 2 sessions    Time 4    Period Weeks    Status New      SLP SHORT TERM GOAL #4   Title pt/spouse will report implementation of 2 external aids to support orientation and memory with mod I    Time 4    Period Weeks    Status New              SLP Long Term Goals - 06/10/21 1612       SLP LONG TERM GOAL #1   Title Care partner will report implementation of appropriate cuing techniques at home over 1 week period    Time 12    Period Weeks    Status New      SLP LONG TERM GOAL #2   Title Pt will self-identify episodes of anomia or dysnomia and attempt to correct during 15 minute conversation with occasional min A over 2 sessions    Time 12    Period Weeks    Status New      SLP LONG TERM GOAL #3   Title Spouse will report pt using mutlimodal communication to support verbal expression (gestures, writing, drawing, picture symbols) in conversation at home with occasional min A over 1 week    Time 12    Period Weeks    Status New      SLP LONG TERM GOAL #4   Title pt/spouse will report implementation of 4 external aids to support orientation and  memory with mod I over 1 week    Time [place] - 06/10/21 1451     Clinical Impression Statement John Buckley presents for ST evaluation, referred from Dr. Delice Lesch. Pt has dx of PPA and mild cognitive impairment. Pt presented with limited awareness of cogntive linguisitic deficits. Wife, [place], primary historian. Pt has been demonstrating cognitive decline for 4 years, word finding difficulties presenting within past year. ST administers QAB, pt scores 5.86, indicating moderate severity of aphasia. Throughout evalution pt routinely using circumlocution, referencing wife to fill in information, and using gestures to supplement verbal expression. Cognition not formally assessed this date but pt presented with decreased attention, impaired auditory comprehension, reduced recall of more complex information which impacted topic maintenance. Skilled ST is recommended to address pt's cogntitive linguisitc deficits, maintain current skills, and implement compensatory strategies in advance of potential progression of disease.    Speech Therapy Frequency 2x / week    Duration 12 weeks    Treatment/Interventions Compensatory strategies;Cueing hierarchy;Functional tasks;Patient/family education;Cognitive reorganization;Environmental controls;Multimodal communcation approach;SLP instruction and feedback;Internal/external aids;Compensatory techniques;Language facilitation    Potential to Achieve Goals Fair    Potential Considerations Medical prognosis    Consulted and Agree with Plan of Care Patient;Family member/caregiver    Family Member Consulted wife, [name]             Patient will benefit from skilled therapeutic intervention in order to improve the following deficits and impairments:   Aphasia  Cognitive communication  deficit    Problem List Patient Active Problem List   Diagnosis Date Noted   Encounter for therapeutic drug monitoring 01/10/2017    Obesity 11/05/2010   HTN (hypertension) 11/05/2010   Hyperlipidemia 11/05/2010   Atrial fibrillation ([place]) 07/13/2010    Marzetta Board, [place] 06/10/2021, 4:19 PM  [facility] [address] [place], [place], [address] Phone: [phone]   Fax:  [phone]  Name: John Buckley MRN: [id number] Date of Birth: 1945-11-06

## 2021-06-15 ENCOUNTER — Other Ambulatory Visit: Payer: Self-pay

## 2021-06-15 ENCOUNTER — Ambulatory Visit: Payer: Medicare Other

## 2021-06-15 DIAGNOSIS — R4701 Aphasia: Secondary | ICD-10-CM

## 2021-06-15 DIAGNOSIS — R41841 Cognitive communication deficit: Secondary | ICD-10-CM | POA: Diagnosis not present

## 2021-06-15 NOTE — Therapy (Signed)
[facility] [address] [place], [place], [address] Phone: [phone]   Fax:  [phone]  Speech Language Pathology Treatment  Patient Details  Name: John Buckley MRN: [id number] Date of Birth: 03/06/1946 Referring Provider (SLP): Dr. Ellouise Newer   Encounter Date: 06/15/2021   End of Session - 06/15/21 1357     Visit Number 2    Number of Visits 25    Date for SLP Re-Evaluation 09/10/21    Authorization Type medicare    SLP Start Time 1400    SLP Stop Time  1450    SLP Time Calculation (min) 50 min    Activity Tolerance Patient tolerated treatment well             Past Medical History:  Diagnosis Date   Atrial fibrillation ([place])    Hyperlipidemia    Hypertension    Long-term (current) use of anticoagulants    Tobacco abuse     Past Surgical History:  Procedure Laterality Date   CARDIAC CATHETERIZATION  01/15/2003   Est. EF is around 65% --  Relatively smooth coronary arteries.  He has a few minor luminal irregularities -- Normal left ventricular systolic function -- Thayer Headings, M.D.   CARDIOVERSION  08/04/20006   was successful   COLONOSCOPY W/ POLYPECTOMY  12/02/2003   A moderate-sized rectal polyp at 15 cm -- ohn C. Amedeo Plenty, M.D.   NOSE SURGERY     TONSILLECTOMY      There were no vitals filed for this visit.   Subjective Assessment - 06/15/21 1453     Subjective "I did nothing"    Patient is accompained by: Family member    Currently in Pain? No/denies                   ADULT SLP TREATMENT - 06/15/21 1357       General Information   Behavior/Cognition Alert;Pleasant mood;Impulsive;Distractible;Requires cueing      Treatment Provided   Treatment provided Cognitive-Linquistic      Cognitive-Linquistic Treatment   Treatment focused on Aphasia;Cognition    Skilled Treatment SLP initiated education and training of caregiver cueing hierarchy to aid word finding in  conversation. Occasional fading to rare cues required to assist caregiver with appropriate cues. Caregiver able to demonstrate use of phonemic, first letter, and sentence completion cues given SLP modeling and instructions. Pt continues with reduced awareness of deficits and errors. Visual aids were helpful to aid attention to cues and targeted words.      Assessment / Recommendations / Plan   Plan Continue with current plan of care      Progression Toward Goals   Progression toward goals Progressing toward goals              SLP Education - 06/15/21 1456     Education Details HEP, cueing hierarchy    Person(s) Educated Patient;Spouse    Methods Explanation;Demonstration;Handout;Verbal cues    Comprehension Verbalized understanding;Returned demonstration;Need further instruction;Verbal cues required              SLP Short Term Goals - 06/15/21 1357       SLP SHORT TERM GOAL #1   Title Care partner will demonstrate appropriate cuing techniques with occasional min A across 2 sessions    Time 4    Period Weeks    Status On-going      SLP SHORT TERM GOAL #2   Title Pt will self-identify episodes of anomia or dysnomia  with 50% accuracy on structured speech tasks with occasional mod A over 2 sessions    Time 4    Period Weeks    Status On-going      SLP SHORT TERM GOAL #3   Title pt will use mutlimodal communication to support verbal expression (gestures, writing, drawing, picture symbols) 3/5 opportunities with occasional mod A across 2 sessions    Time 4    Period Weeks    Status On-going      SLP SHORT TERM GOAL #4   Title pt/spouse will report implementation of 2 external aids to support orientation and memory with mod I    Time 4    Period Weeks    Status On-going              SLP Long Term Goals - 06/15/21 1358       SLP LONG TERM GOAL #1   Title Care partner will report implementation of appropriate cuing techniques at home over 1 week period    Time  12    Period Weeks    Status On-going      SLP LONG TERM GOAL #2   Title Pt will self-identify episodes of anomia or dysnomia and attempt to correct during 15 minute conversation with occasional min A over 2 sessions    Time 12    Period Weeks    Status On-going      SLP LONG TERM GOAL #3   Title Spouse will report pt using mutlimodal communication to support verbal expression (gestures, writing, drawing, picture symbols) in conversation at home with occasional min A over 1 week    Time 12    Period Weeks    Status On-going      SLP LONG TERM GOAL #4   Title pt/spouse will report implementation of 4 external aids to support orientation and memory with mod I over 1 week    Time 12    Period Weeks    Status On-going              Plan - 06/15/21 1357     Clinical Impression Statement John Buckley presents for ST evaluation, referred from Dr. Delice Lesch. Pt has dx of PPA and mild cognitive impairment. Pt presented with limited awareness of cogntive linguisitic deficits. SLP initiated education and training of recommended cues to support patient during word finding episodes. SLP will continue to target pt awareness of errors and deficits to aid carryover and comprehension. Skilled ST is recommended to address pt's cogntitive linguisitc deficits, maintain current skills, and implement compensatory strategies in advance of potential progression of disease.    Speech Therapy Frequency 2x / week    Duration 12 weeks    Treatment/Interventions Compensatory strategies;Cueing hierarchy;Functional tasks;Patient/family education;Cognitive reorganization;Environmental controls;Multimodal communcation approach;SLP instruction and feedback;Internal/external aids;Compensatory techniques;Language facilitation    Potential to Achieve Goals Fair    Potential Considerations Medical prognosis    Consulted and Agree with Plan of Care Patient;Family member/caregiver    Family Member Consulted wife, [name]              Patient will benefit from skilled therapeutic intervention in order to improve the following deficits and impairments:   Aphasia  Cognitive communication deficit    Problem List Patient Active Problem List   Diagnosis Date Noted   Encounter for therapeutic drug monitoring 01/10/2017   Obesity 11/05/2010   HTN (hypertension) 11/05/2010   Hyperlipidemia 11/05/2010   Atrial fibrillation ([place]) 07/13/2010    Marzetta Board,  CCC-SLP 06/15/2021, 2:57 PM  [facility] [address] [place], [place], [address] Phone: [phone]   Fax:  [phone]   Name: DAMONT BALLES MRN: [id number] Date of Birth: Jul 28, 1945

## 2021-06-15 NOTE — Patient Instructions (Signed)
1. Open the _____.  2. Turn on the _____.  3. Cut with a _____.  4. Drive a _____.  5. Drink a cup of _____.  6. Wash with _____.  7. Salt and _____  8. Needle and _____  9. A dozen _____  10. Read a _____.  3. Write with a _____.  12. Ride a _____.  13. Cream and _____  14. Answer the _____.  15. Sleep on a _____.

## 2021-06-17 ENCOUNTER — Other Ambulatory Visit: Payer: Self-pay

## 2021-06-17 ENCOUNTER — Ambulatory Visit: Payer: Medicare Other | Attending: Neurology | Admitting: Speech Pathology

## 2021-06-17 DIAGNOSIS — R41841 Cognitive communication deficit: Secondary | ICD-10-CM | POA: Insufficient documentation

## 2021-06-17 DIAGNOSIS — R4701 Aphasia: Secondary | ICD-10-CM | POA: Insufficient documentation

## 2021-06-17 NOTE — Patient Instructions (Signed)
Cueing Hierarchy: ? ?Description - define the word or provide the function ?First letter - provide the first letter of the target word ?Spell - spell the target word, orally ?Phrase Completion - give a phrase to complete ?First sound - provide the first sound of the target word ?Model - say the word aloud to repeat ? ?Let's create a target word list... ?Family names ?Dog ?Places you go ?Favorite foods ?Things you drink ?Favorite restaurants ?Hobbies ?Interests ?Vacations  ?Memorable events  ?Sports teams -- [employer], [place]  ?Personal information  ?Important places  ? ?If you can, bring in some family photos so we can start a memory book  ? ? ?

## 2021-06-17 NOTE — Therapy (Signed)
[place] ?[place] ?[place], [place], [address] ?Phone: [phone]   Fax:  [phone] ? ?Speech Language Pathology Treatment ? ?Patient Details  ?Name: John Buckley ?MRN: [id number] ?Date of Birth: 05/17/1945 ?Referring Provider (SLP): Dr. Delice Lesch, Santiago Glad ? ? ?Encounter Date: 06/17/2021 ? ? End of Session - 06/17/21 1515   ? ? Visit Number 3   ? Number of Visits 25   ? Date for SLP Re-Evaluation 09/10/21   ? Authorization Type medicare   ? SLP Start Time 1415   ? SLP Stop Time  1503   ? SLP Time Calculation (min) 48 min   ? Activity Tolerance Patient tolerated treatment well   ? ?  ?  ? ?  ? ? ?Past Medical History:  ?Diagnosis Date  ? Atrial fibrillation ([place])   ? Hyperlipidemia   ? Hypertension   ? Long-term (current) use of anticoagulants   ? Tobacco abuse   ? ? ?Past Surgical History:  ?Procedure Laterality Date  ? CARDIAC CATHETERIZATION  01/15/2003  ? Est. EF is around 65% --  Relatively smooth coronary arteries.  He has a few minor luminal irregularities -- Normal left ventricular systolic function -- Thayer Headings, M.D.  ? CARDIOVERSION  08/04/20006  ? was successful  ? COLONOSCOPY W/ POLYPECTOMY  12/02/2003  ? A moderate-sized rectal polyp at 15 cm -- ohn C. Amedeo Plenty, M.D.  ? NOSE SURGERY    ? TONSILLECTOMY    ? ? ?There were no vitals filed for this visit. ? ? Subjective Assessment - 06/17/21 1420   ? ? Subjective "It's better than yesterday there was a lot of stuff out there yesterday."   ? Patient is accompained by: Family member   ? Currently in Pain? No/denies   ? ?  ?  ? ?  ? ? ? ? ? ? ? ? ADULT SLP TREATMENT - 06/17/21 1507   ? ?  ? General Information  ? Behavior/Cognition Alert;Pleasant mood;Impulsive;Distractible;Requires cueing   ?  ? Treatment Provided  ? Treatment provided Cognitive-Linquistic   ?  ? Cognitive-Linquistic Treatment  ? Treatment focused on Aphasia;Cognition   ? Skilled Treatment SLP provided additional  information on cueing heirarchy/methods to cue John Buckley when he is experiencing anomia. Provided care partner with written handout to supplement verbal instruction and demonstration. Care partner demonstrates phonemic cueing, providing carrier phrase, modeling + encouraging repetition appropriately based on pt's level of difficulty. Care partners appropriately begins with least helpful cues and progresses to more helpful with rare mod A from ST. Anomia present frequently throughout session, using guiding questions, word finding assistance, pt able to communicate message to ST approx 50% of instances. Use of visual cues required to support pt writing family names after ID'd.   ?  ? Assessment / Recommendations / Plan  ? Plan Continue with current plan of care   ?  ? Progression Toward Goals  ? Progression toward goals Progressing toward goals   ? ?  ?  ? ?  ? ? ? SLP Education - 06/17/21 1514   ? ? Education Details cueing heirarchy; generating target word/topic list   ? Person(s) Educated Patient;Spouse   ? Methods Explanation;Demonstration;Handout   ? Comprehension Verbalized understanding;Returned demonstration;Need further instruction   ? ?  ?  ? ?  ? ? ? SLP Short Term Goals - 06/17/21 1528   ? ?  ? SLP SHORT TERM GOAL #1  ? Title Care partner will demonstrate appropriate cuing  techniques with occasional min A across 2 sessions   ? Time 4   ? Period Weeks   ? Status On-going   ?  ? SLP SHORT TERM GOAL #2  ? Title Pt will self-identify episodes of anomia or dysnomia with 50% accuracy on structured speech tasks with occasional mod A over 2 sessions   ? Time 4   ? Period Weeks   ? Status On-going   ?  ? SLP SHORT TERM GOAL #3  ? Title pt will use mutlimodal communication to support verbal expression (gestures, writing, drawing, picture symbols) 3/5 opportunities with occasional mod A across 2 sessions   ? Time 4   ? Period Weeks   ? Status On-going   ?  ? SLP SHORT TERM GOAL #4  ? Title pt/spouse will report  implementation of 2 external aids to support orientation and memory with mod I   ? Time 4   ? Period Weeks   ? Status On-going   ? ?  ?  ? ?  ? ? ? SLP Long Term Goals - 06/17/21 1529   ? ?  ? SLP LONG TERM GOAL #1  ? Title Care partner will report implementation of appropriate cuing techniques at home over 1 week period   ? Time 12   ? Period Weeks   ? Status On-going   ?  ? SLP LONG TERM GOAL #2  ? Title Pt will self-identify episodes of anomia or dysnomia and attempt to correct during 15 minute conversation with occasional min A over 2 sessions   ? Time 12   ? Period Weeks   ? Status On-going   ?  ? SLP LONG TERM GOAL #3  ? Title Spouse will report pt using mutlimodal communication to support verbal expression (gestures, writing, drawing, picture symbols) in conversation at home with occasional min A over 1 week   ? Time 12   ? Period Weeks   ? Status On-going   ?  ? SLP LONG TERM GOAL #4  ? Title pt/spouse will report implementation of 4 external aids to support orientation and memory with mod I over 1 week   ? Time 12   ? Period Weeks   ? Status On-going   ? ?  ?  ? ?  ? ? ? Plan - 06/17/21 1528   ? ? Clinical Impression Statement John Buckley presents for ST evaluation, referred from Dr. Delice Lesch. Pt has dx of PPA and mild cognitive impairment. Pt presented with limited awareness of cogntive linguisitic deficits. SLP initiated education and training of recommended cues to support patient during word finding episodes. SLP will continue to target pt awareness of errors and deficits to aid carryover and comprehension. Skilled ST is recommended to address pt's cogntitive linguisitc deficits, maintain current skills, and implement compensatory strategies in advance of potential progression of disease.   ? Speech Therapy Frequency 2x / week   ? Duration 12 weeks   ? Treatment/Interventions Compensatory strategies;Cueing hierarchy;Functional tasks;Patient/family education;Cognitive reorganization;Environmental  controls;Multimodal communcation approach;SLP instruction and feedback;Internal/external aids;Compensatory techniques;Language facilitation   ? Potential to [place]   ? Potential Considerations Medical prognosis   ? Consulted and Agree with Plan of Care Patient;Family member/caregiver   ? Family Member Consulted wife, John Buckley   ? ?  ?  ? ?  ? ? ?Patient will benefit from skilled therapeutic intervention in order to improve the following deficits and impairments:   ?Aphasia ? ?Cognitive communication deficit ? ? ? ?  Problem List ?Patient Active Problem List  ? Diagnosis Date Noted  ? Encounter for therapeutic drug monitoring 01/10/2017  ? Obesity 11/05/2010  ? HTN (hypertension) 11/05/2010  ? Hyperlipidemia 11/05/2010  ? Atrial fibrillation ([place]) 07/13/2010  ? ? ?Su Monks, CF-SLP ?06/17/2021, 3:30 PM ? ?[facility] ?[place] ?[place], [place], [address] ?Phone: [phone]   Fax:  [phone] ? ? ?Name: John Buckley ?MRN: [id number] ?Date of Birth: 1945/04/22 ? ?

## 2021-06-22 ENCOUNTER — Ambulatory Visit: Payer: Medicare Other

## 2021-06-22 ENCOUNTER — Other Ambulatory Visit: Payer: Self-pay

## 2021-06-22 DIAGNOSIS — R41841 Cognitive communication deficit: Secondary | ICD-10-CM | POA: Diagnosis not present

## 2021-06-22 DIAGNOSIS — R4701 Aphasia: Secondary | ICD-10-CM

## 2021-06-22 NOTE — Therapy (Signed)
[place] ?[place] ?[place], [place], [address] ?Phone: [phone]   Fax:  [phone] ? ?Speech Language Pathology Treatment ? ?Patient Details  ?Name: John Buckley ?MRN: [id number] ?Date of Birth: 1946/02/02 ?Referring Provider (SLP): Dr. Delice Lesch, Santiago Glad ? ? ?Encounter Date: 06/22/2021 ? ? End of Session - 06/22/21 1356   ? ? Visit Number 4   ? Number of Visits 25   ? Date for SLP Re-Evaluation 09/10/21   ? Authorization Type medicare   ? SLP Start Time 1401   ? SLP Stop Time  1450   ? SLP Time Calculation (min) 49 min   ? Activity Tolerance Patient tolerated treatment well   ? ?  ?  ? ?  ? ? ?Past Medical History:  ?Diagnosis Date  ? Atrial fibrillation ([place])   ? Hyperlipidemia   ? Hypertension   ? Long-term (current) use of anticoagulants   ? Tobacco abuse   ? ? ?Past Surgical History:  ?Procedure Laterality Date  ? CARDIAC CATHETERIZATION  01/15/2003  ? Est. EF is around 65% --  Relatively smooth coronary arteries.  He has a few minor luminal irregularities -- Normal left ventricular systolic function -- Thayer Headings, M.D.  ? CARDIOVERSION  08/04/20006  ? was successful  ? COLONOSCOPY W/ POLYPECTOMY  12/02/2003  ? A moderate-sized rectal polyp at 15 cm -- ohn C. Amedeo Plenty, M.D.  ? NOSE SURGERY    ? TONSILLECTOMY    ? ? ?There were no vitals filed for this visit. ? ? Subjective Assessment - 06/22/21 1357   ? ? Subjective "really good"   ? Patient is accompained by: Family member   ? Currently in Pain? No/denies   ? ?  ?  ? ?  ? ? ? ? ? ? ? ? ADULT SLP TREATMENT - 06/22/21 1355   ? ?  ? General Information  ? Behavior/Cognition Alert;Pleasant mood;Impulsive;Distractible;Requires cueing   ?  ? Treatment Provided  ? Treatment provided Cognitive-Linquistic   ?  ? Cognitive-Linquistic Treatment  ? Treatment focused on Aphasia;Cognition   ? Skilled Treatment Some increased awareness of paraphasias exhibited today in structured speech tasks for  personally relevant topics. Wife demonstrated increasing ability to appropriately cue patient when needed versus providing targeted words. Pt benefited from usual first letter, phonemic, and visual cues to aid naming (wife provided pictures). Repetition and written cues aided success for secondary trials. SLP initiated creation of memory/communication book, with pt and family to add to memory/communication book as part of HEP.   ?  ? Assessment / Recommendations / Plan  ? Plan Continue with current plan of care   ?  ? Progression Toward Goals  ? Progression toward goals Progressing toward goals   ? ?  ?  ? ?  ? ? ? SLP Education - 06/22/21 1515   ? ? Education Details personally relevant topics, pt ordering at restaurant with visual support   ? Person(s) Educated Patient;Spouse   ? Methods Explanation;Demonstration;Verbal cues;Handout   ? Comprehension Verbalized understanding;Returned demonstration;Verbal cues required;Need further instruction   ? ?  ?  ? ?  ? ? ? SLP Short Term Goals - 06/22/21 1356   ? ?  ? SLP SHORT TERM GOAL #1  ? Title Care partner will demonstrate appropriate cuing techniques with occasional min A across 2 sessions   ? Baseline 06-22-21   ? Time 3   ? Period Weeks   ? Status On-going   ?  ?  SLP SHORT TERM GOAL #2  ? Title Pt will self-identify episodes of anomia or dysnomia with 50% accuracy on structured speech tasks with occasional mod A over 2 sessions   ? Time 3   ? Period Weeks   ? Status On-going   ?  ? SLP SHORT TERM GOAL #3  ? Title pt will use mutlimodal communication to support verbal expression (gestures, writing, drawing, picture symbols) 3/5 opportunities with occasional mod A across 2 sessions   ? Time 3   ? Period Weeks   ? Status On-going   ?  ? SLP SHORT TERM GOAL #4  ? Title pt/spouse will report implementation of 2 external aids to support orientation and memory with mod I   ? Time 3   ? Period Weeks   ? Status On-going   ? ?  ?  ? ?  ? ? ? SLP Long Term Goals - 06/22/21 1357    ? ?  ? SLP LONG TERM GOAL #1  ? Title Care partner will report implementation of appropriate cuing techniques at home over 1 week period   ? Time 11   ? Period Weeks   ? Status On-going   ?  ? SLP LONG TERM GOAL #2  ? Title Pt will self-identify episodes of anomia or dysnomia and attempt to correct during 15 minute conversation with occasional min A over 2 sessions   ? Time 11   ? Period Weeks   ? Status On-going   ?  ? SLP LONG TERM GOAL #3  ? Title Spouse will report pt using mutlimodal communication to support verbal expression (gestures, writing, drawing, picture symbols) in conversation at home with occasional min A over 1 week   ? Time 11   ? Period Weeks   ? Status On-going   ?  ? SLP LONG TERM GOAL #4  ? Title pt/spouse will report implementation of 4 external aids to support orientation and memory with mod I over 1 week   ? Time 11   ? Period Weeks   ? Status On-going   ? ?  ?  ? ?  ? ? ? Plan - 06/22/21 1356   ? ? Clinical Impression Statement John Buckley has dx of PPA and mild cognitive impairment. SLP conducted ongoing education and training of recommended cues and external aids to support patient during word finding episodes. Memory/communication support book initiated this session to aid word finding and functional communication as PPA progresses. SLP will continue to target pt awareness of errors and deficits to aid carryover and comprehension. Skilled ST is recommended to address pt's cogntitive linguisitc deficits, maintain current skills, and implement compensatory strategies in advance of potential progression of disease.   ? Speech Therapy Frequency 2x / week   ? Duration 12 weeks   ? Treatment/Interventions Compensatory strategies;Cueing hierarchy;Functional tasks;Patient/family education;Cognitive reorganization;Environmental controls;Multimodal communcation approach;SLP instruction and feedback;Internal/external aids;Compensatory techniques;Language facilitation   ? Potential to [place]    ? Potential Considerations Medical prognosis   ? Consulted and Agree with Plan of Care Patient;Family member/caregiver   ? Family Member Consulted wife, John Buckley   ? ?  ?  ? ?  ? ? ?Patient will benefit from skilled therapeutic intervention in order to improve the following deficits and impairments:   ?Aphasia ? ?Cognitive communication deficit ? ? ? ?Problem List ?Patient Active Problem List  ? Diagnosis Date Noted  ? Encounter for therapeutic drug monitoring 01/10/2017  ? Obesity 11/05/2010  ? HTN (  hypertension) 11/05/2010  ? Hyperlipidemia 11/05/2010  ? Atrial fibrillation ([place]) 07/13/2010  ? ? ?Marzetta Board, CCC-SLP ?06/22/2021, 3:18 PM ? ?[facility] ?[place] ?[place], [place], [address] ?Phone: [phone]   Fax:  [phone] ? ? ?Name: John Buckley ?MRN: [id number] ?Date of Birth: 15-Apr-1946 ? ?

## 2021-06-22 NOTE — Patient Instructions (Signed)
About Me:  ?I am from [place].  ? ? ? ? ?Family ?Danelle Berry is my wife  ?My two daughters are Anderson Malta and Lattie Haw.  ?Anderson Malta has two sons named Thomasena Edis and Faxon ?Lattie Haw has one daughter named Jarrett Soho.  ?I have one sister named Vaughan Basta.  ?My aunt is [place] She is like a sister to me.  ? ? ? 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 ? ?My Routine:  ?I go to [employer] every two weeks with my old work buddies.  ? ? ? ? ?My Favorite Foods: ?[place]  ? ?

## 2021-06-24 ENCOUNTER — Other Ambulatory Visit: Payer: Self-pay

## 2021-06-24 ENCOUNTER — Ambulatory Visit: Payer: Medicare Other

## 2021-06-24 DIAGNOSIS — R4701 Aphasia: Secondary | ICD-10-CM

## 2021-06-24 DIAGNOSIS — R41841 Cognitive communication deficit: Secondary | ICD-10-CM | POA: Diagnosis not present

## 2021-06-24 NOTE — Therapy (Signed)
[facility] ?[place] ?[place], [place], [address] ?Phone: [phone]   Fax:  [phone] ? ?Speech Language Pathology Treatment ? ?Patient Details  ?Name: John Buckley ?MRN: [id number] ?Date of Birth: 1945-05-13 ?Referring Provider (SLP): Dr. Delice Lesch, Santiago Glad ? ? ?Encounter Date: 06/24/2021 ? ? End of Session - 06/24/21 1259   ? ? Visit Number 5   ? Number of Visits 25   ? Date for SLP Re-Evaluation 09/10/21   ? Authorization Type medicare   ? SLP Start Time 1311   ? SLP Stop Time  1400   ? SLP Time Calculation (min) 49 min   ? Activity Tolerance Patient tolerated treatment well   ? ?  ?  ? ?  ? ? ?Past Medical History:  ?Diagnosis Date  ? Atrial fibrillation ([place])   ? Hyperlipidemia   ? Hypertension   ? Long-term (current) use of anticoagulants   ? Tobacco abuse   ? ? ?Past Surgical History:  ?Procedure Laterality Date  ? CARDIAC CATHETERIZATION  01/15/2003  ? Est. EF is around 65% --  Relatively smooth coronary arteries.  He has a few minor luminal irregularities -- Normal left ventricular systolic function -- Thayer Headings, M.D.  ? CARDIOVERSION  08/04/20006  ? was successful  ? COLONOSCOPY W/ POLYPECTOMY  12/02/2003  ? A moderate-sized rectal polyp at 15 cm -- ohn C. Amedeo Plenty, M.D.  ? NOSE SURGERY    ? TONSILLECTOMY    ? ? ?There were no vitals filed for this visit. ? ? Subjective Assessment - 06/24/21 1310   ? ? Subjective "not much"   ? Patient is accompained by: Family member   ? Currently in Pain? No/denies   ? ?  ?  ? ?  ? ? ? ? ? ? ? ? ADULT SLP TREATMENT - 06/24/21 1259   ? ?  ? General Information  ? Behavior/Cognition Alert;Pleasant mood;Impulsive;Distractible;Requires cueing   ?  ? Treatment Provided  ? Treatment provided Cognitive-Linquistic   ?  ? Cognitive-Linquistic Treatment  ? Treatment focused on Aphasia;Cognition   ? Skilled Treatment Pt exhibits improved auditory comprehension with pt utilizing repetition of SLP questions to aid  understanding in simple conversation. Usual anomia exhibited in discussion of breakfast meal. Pt frequently looked to wife to insert targeted word. SLP provided mod A for phonemic cues, written cues, visual cues, and semantic cues to aid naming. Wife continues to provide appropriate cues (semantic and phonemic). Wife reports pt independently put communication aid by armchair and frequently used it/read it at home since last ST session. SLP continued to incorporate new details into communication support. Pt exhibited good reading accuracy and attention while discussing previous/newly added information.   ?  ? Assessment / Recommendations / Plan  ? Plan Continue with current plan of care   ?  ? Progression Toward Goals  ? Progression toward goals Progressing toward goals   ? ?  ?  ? ?  ? ? ? SLP Education - 06/24/21 1454   ? ? Education Details education re: aphasia, how to utilize/modify communication support   ? Person(s) Educated Patient;Spouse   ? Methods Explanation;Demonstration;Verbal cues;Handout   ? Comprehension Verbalized understanding;Returned demonstration;Verbal cues required;Need further instruction   ? ?  ?  ? ?  ? ? ? SLP Short Term Goals - 06/24/21 1300   ? ?  ? SLP SHORT TERM GOAL #1  ? Title Care partner will demonstrate appropriate cuing techniques with occasional min  A across 2 sessions   ? Baseline 06-22-21, 06-24-21   ? Time 3   ? Period Weeks   ? Status Achieved   ?  ? SLP SHORT TERM GOAL #2  ? Title Pt will self-identify episodes of anomia or dysnomia with 50% accuracy on structured speech tasks with occasional mod A over 2 sessions   ? Time 3   ? Period Weeks   ? Status On-going   ?  ? SLP SHORT TERM GOAL #3  ? Title pt will use mutlimodal communication to support verbal expression (gestures, writing, drawing, picture symbols) 3/5 opportunities with occasional mod A across 2 sessions   ? Baseline 06-24-21   ? Time 3   ? Period Weeks   ? Status On-going   ?  ? SLP SHORT TERM GOAL #4  ? Title  pt/spouse will report implementation of 2 external aids to support orientation and memory with mod I   ? Time 3   ? Period Weeks   ? Status On-going   ? ?  ?  ? ?  ? ? ? SLP Long Term Goals - 06/24/21 1300   ? ?  ? SLP LONG TERM GOAL #1  ? Title Care partner will report implementation of appropriate cuing techniques at home over 1 week period   ? Time 11   ? Period Weeks   ? Status On-going   ?  ? SLP LONG TERM GOAL #2  ? Title Pt will self-identify episodes of anomia or dysnomia and attempt to correct during 15 minute conversation with occasional min A over 2 sessions   ? Time 11   ? Period Weeks   ? Status On-going   ?  ? SLP LONG TERM GOAL #3  ? Title Spouse will report pt using mutlimodal communication to support verbal expression (gestures, writing, drawing, picture symbols) in conversation at home with occasional min A over 1 week   ? Time 11   ? Period Weeks   ? Status On-going   ?  ? SLP LONG TERM GOAL #4  ? Title pt/spouse will report implementation of 4 external aids to support orientation and memory with mod I over 1 week   ? Time 11   ? Period Weeks   ? Status On-going   ? ?  ?  ? ?  ? ? ? Plan - 06/24/21 1300   ? ? 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 SLP conducted ongoing education and training of recommended cues and external aids to support patient during word finding episodes re: personally relevant topics. Memory/communication support modifications continued this session to aid word finding and functional communication as PPA progresses. Pt has begun independently implementing communication support at home. Skilled ST is recommended to address pt's cogntitive linguisitc deficits, maintain current skills, and implement compensatory strategies in advance of potential progression of disease.   ? Speech Therapy Frequency 2x / week   ? Duration 12 weeks   ? Treatment/Interventions Compensatory strategies;Cueing hierarchy;Functional tasks;Patient/family  education;Cognitive reorganization;Environmental controls;Multimodal communcation approach;SLP instruction and feedback;Internal/external aids;Compensatory techniques;Language facilitation   ? Potential to [place]   ? Potential Considerations Medical prognosis   ? Consulted and Agree with Plan of Care Patient;Family member/caregiver   ? Family Member Consulted wife, John Buckley   ? ?  ?  ? ?  ? ? ?Patient will benefit from skilled therapeutic intervention in order to improve the following deficits and impairments:   ?Aphasia ? ?Cognitive  communication deficit ? ? ? ?Problem List ?Patient Active Problem List  ? Diagnosis Date Noted  ? Encounter for therapeutic drug monitoring 01/10/2017  ? Obesity 11/05/2010  ? HTN (hypertension) 11/05/2010  ? Hyperlipidemia 11/05/2010  ? Atrial fibrillation ([place]) 07/13/2010  ? ? ?Marzetta Board, CCC-SLP ?06/24/2021, 2:57 PM ? ?[facility] ?[place] ?[place], [place], [address] ?Phone: [phone]   Fax:  [phone] ? ? ?Name: John Buckley ?MRN: [id number] ?Date of Birth: 25-Sep-1945 ? ?

## 2021-06-29 DIAGNOSIS — I10 Essential (primary) hypertension: Secondary | ICD-10-CM | POA: Diagnosis not present

## 2021-06-29 DIAGNOSIS — F0280 Dementia in other diseases classified elsewhere without behavioral disturbance: Secondary | ICD-10-CM | POA: Diagnosis not present

## 2021-06-29 DIAGNOSIS — F0390 Unspecified dementia without behavioral disturbance: Secondary | ICD-10-CM | POA: Diagnosis not present

## 2021-06-29 DIAGNOSIS — G3101 Pick's disease: Secondary | ICD-10-CM | POA: Diagnosis not present

## 2021-06-29 DIAGNOSIS — D126 Benign neoplasm of colon, unspecified: Secondary | ICD-10-CM | POA: Diagnosis not present

## 2021-06-30 ENCOUNTER — Other Ambulatory Visit: Payer: Self-pay

## 2021-06-30 ENCOUNTER — Ambulatory Visit: Payer: Medicare Other | Admitting: Speech Pathology

## 2021-06-30 DIAGNOSIS — R41841 Cognitive communication deficit: Secondary | ICD-10-CM | POA: Diagnosis not present

## 2021-06-30 DIAGNOSIS — R4701 Aphasia: Secondary | ICD-10-CM

## 2021-06-30 NOTE — Patient Instructions (Addendum)
Brainstorm top 3 stories you like to share: ? ? [place] with [employer] ? ?2.  _______________________ ? ?3.  _______________________ ? ?In future session we can create conversational support in telling those stories.  ? ?Use pictures, words, other visual supports to enhance conversational success.  ?

## 2021-06-30 NOTE — Therapy (Signed)
[facility] ?[facility] [place] ?[place], [place], [address] ?Phone: [phone]   Fax:  [phone] ? ?Speech Language Pathology Treatment ? ?Patient Details  ?Name: John Buckley ?MRN: [id number] ?Date of Birth: [birth year] ?Referring Provider (SLP): Dr. Delice Lesch, Santiago Glad ? ? ?Encounter Date: 06/30/2021 ? ? End of Session - 06/30/21 1430   ? ? Visit Number 6   ? Number of Visits 25   ? Date for SLP Re-Evaluation 09/10/21   ? Authorization Type medicare   ? SLP Start Time 1445   ? SLP Stop Time  1530   ? SLP Time Calculation (min) 45 min   ? Activity Tolerance Patient tolerated treatment well   ? ?  ?  ? ?  ? ? ?Past Medical History:  ?Diagnosis Date  ? Atrial fibrillation ([place])   ? Hyperlipidemia   ? Hypertension   ? Long-term (current) use of anticoagulants   ? Tobacco abuse   ? ? ?Past Surgical History:  ?Procedure Laterality Date  ? CARDIAC CATHETERIZATION  01/15/2003  ? Est. EF is around 65% --  Relatively smooth coronary arteries.  He has a few minor luminal irregularities -- Normal left ventricular systolic function -- Thayer Headings, M.D.  ? CARDIOVERSION  08/04/20006  ? was successful  ? COLONOSCOPY W/ POLYPECTOMY  12/02/2003  ? A moderate-sized rectal polyp at 15 cm -- ohn C. Amedeo Plenty, M.D.  ? NOSE SURGERY    ? TONSILLECTOMY    ? ? ?There were no vitals filed for this visit. ? ? Subjective Assessment - 06/30/21 1432   ? ? Subjective "doing pretty good"   ? Patient is accompained by: Family member   ? Currently in Pain? No/denies   ? ?  ?  ? ?  ? ? ? ? ? ? ? ? ADULT SLP TREATMENT - 06/30/21 0001   ? ?  ? General Information  ? Behavior/Cognition Alert;Pleasant mood;Impulsive;Distractible;Requires cueing   ?  ? Treatment Provided  ? Treatment provided Cognitive-Linquistic   ?  ? Cognitive-Linquistic Treatment  ? Treatment focused on Aphasia;Cognition   ? Skilled Treatment Pt exhibits usual anomia throughout conversational speech. Target repairing  conversational breakdowns through use of questioning cues, semantic and phonemic cues, and implementing visual references when able (e.g. using map to orient and name places pt has traveled to). Max A to name x4 courntries travelled to; max A to name order from [employer], max A to name friends he meets for breakfast. Wife continues to demonstrate appropriate cueing for anomia. Use of written supports to maintain topic maintenence intermittantly successful on this date. Discussion on implementing scripts for stories pt likes to tell, pt and wife will brainstrom top 3 stories pt tells and ST will faciliate writing down key points to use as visual aid in future session.   ?  ? Assessment / Recommendations / Plan  ? Plan Continue with current plan of care   ?  ? Progression Toward Goals  ? Progression toward goals Progressing toward goals   ? ?  ?  ? ?  ? ? ? SLP Education - 06/30/21 1539   ? ? Education Details communication supports, scripts   ? Person(s) Educated Patient;Spouse   ? Methods Explanation;Demonstration;Verbal cues;Handout   ? Comprehension Returned demonstration;Verbalized understanding;Need further instruction   ? ?  ?  ? ?  ? ? ? SLP Short Term Goals - 06/30/21 1431   ? ?  ? SLP SHORT TERM GOAL #1  ?  Title Care partner will demonstrate appropriate cuing techniques with occasional min A across 2 sessions   ? Baseline 06-22-21, 06-24-21   ? Status Achieved   ?  ? SLP SHORT TERM GOAL #2  ? Title Pt will self-identify episodes of anomia or dysnomia with 50% accuracy on structured speech tasks with occasional mod A over 2 sessions   ? Time 2   ? Period Weeks   ? Status On-going   ?  ? SLP SHORT TERM GOAL #3  ? Title pt will use mutlimodal communication to support verbal expression (gestures, writing, drawing, picture symbols) 3/5 opportunities with occasional mod A across 2 sessions   ? Baseline 06-24-21   ? Time 2   ? Period Weeks   ? Status On-going   ?  ? SLP SHORT TERM GOAL #4  ? Title pt/spouse will report  implementation of 2 external aids to support orientation and memory with mod I   ? Time 2   ? Period Weeks   ? Status On-going   ? ?  ?  ? ?  ? ? ? SLP Long Term Goals - 06/30/21 1431   ? ?  ? SLP LONG TERM GOAL #1  ? Title Care partner will report implementation of appropriate cuing techniques at home over 1 week period   ? Time 10   ? Period Weeks   ? Status On-going   ?  ? SLP LONG TERM GOAL #2  ? Title Pt will self-identify episodes of anomia or dysnomia and attempt to correct during 15 minute conversation with occasional min A over 2 sessions   ? Time 10   ? Period Weeks   ? Status On-going   ?  ? SLP LONG TERM GOAL #3  ? Title Spouse will report pt using mutlimodal communication to support verbal expression (gestures, writing, drawing, picture symbols) in conversation at home with occasional min A over 1 week   ? Time 10   ? Period Weeks   ? Status On-going   ?  ? SLP LONG TERM GOAL #4  ? Title pt/spouse will report implementation of 4 external aids to support orientation and memory with mod I over 1 week   ? Time 10   ? Period Weeks   ? Status On-going   ? ?  ?  ? ?  ? ? ? Plan - 06/30/21 1430   ? ? 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 SLP conducted ongoing education and training of recommended cues and external aids to support patient during word finding episodes re: personally relevant topics. Memory/communication support modifications continued this session to aid word finding and functional communication as PPA progresses. Pt has begun independently implementing communication support at home. Skilled ST is recommended to address pt's cogntitive linguisitc deficits, maintain current skills, and implement compensatory strategies in advance of potential progression of disease.   ? Speech Therapy Frequency 2x / week   ? Duration 12 weeks   ? Treatment/Interventions Compensatory strategies;Cueing hierarchy;Functional tasks;Patient/family education;Cognitive  reorganization;Environmental controls;Multimodal communcation approach;SLP instruction and feedback;Internal/external aids;Compensatory techniques;Language facilitation   ? Potential to [place]   ? Potential Considerations Medical prognosis   ? Consulted and Agree with Plan of Care Patient;Family member/caregiver   ? Family Member Consulted wife, John Buckley   ? ?  ?  ? ?  ? ? ?Patient will benefit from skilled therapeutic intervention in order to improve the following deficits and impairments:   ?Aphasia ? ?  Cognitive communication deficit ? ? ? ?Problem List ?Patient Active Problem List  ? Diagnosis Date Noted  ? Encounter for therapeutic drug monitoring 01/10/2017  ? Obesity 11/05/2010  ? HTN (hypertension) 11/05/2010  ? Hyperlipidemia 11/05/2010  ? Atrial fibrillation ([place]) 07/13/2010  ? ? ?Su Monks, CF-SLP ?06/30/2021, 3:40 PM ? ? ?[facility] ?[place], [place], [address] ?Phone: [phone]   Fax:  [phone] ? ? ?Name: John Buckley ?MRN: [id number] ?Date of Birth: 01-02-1946 ? ?

## 2021-07-01 ENCOUNTER — Ambulatory Visit: Payer: Medicare Other

## 2021-07-01 DIAGNOSIS — R4701 Aphasia: Secondary | ICD-10-CM | POA: Diagnosis not present

## 2021-07-01 DIAGNOSIS — R41841 Cognitive communication deficit: Secondary | ICD-10-CM

## 2021-07-01 NOTE — Patient Instructions (Signed)
Write down any friends names and connections  ?

## 2021-07-02 NOTE — Therapy (Signed)
[facility] ?[facility] ?[place], [place], [address] ?Phone: [phone]   Fax:  [phone] ? ?Speech Language Pathology Treatment ? ?Patient Details  ?Name: John Buckley ?MRN: [id number] ?Date of Birth: 28-Aug-1945 ?Referring Provider (SLP): Dr. Delice Lesch, Santiago Glad ? ? ?Encounter Date: 07/01/2021 ? ? End of Session - 07/01/21 1445   ? ? Visit Number 7   ? Number of Visits 25   ? Date for SLP Re-Evaluation 09/10/21   ? Authorization Type medicare   ? SLP Start Time 1445   ? SLP Stop Time  1533   ? SLP Time Calculation (min) 48 min   ? Activity Tolerance Patient tolerated treatment well   ? ?  ?  ? ?  ? ? ?Past Medical History:  ?Diagnosis Date  ? Atrial fibrillation ([place])   ? Hyperlipidemia   ? Hypertension   ? Long-term (current) use of anticoagulants   ? Tobacco abuse   ? ? ?Past Surgical History:  ?Procedure Laterality Date  ? CARDIAC CATHETERIZATION  01/15/2003  ? Est. EF is around 65% --  Relatively smooth coronary arteries.  He has a few minor luminal irregularities -- Normal left ventricular systolic function -- Thayer Headings, M.D.  ? CARDIOVERSION  08/04/20006  ? was successful  ? COLONOSCOPY W/ POLYPECTOMY  12/02/2003  ? A moderate-sized rectal polyp at 15 cm -- ohn C. Amedeo Plenty, M.D.  ? NOSE SURGERY    ? TONSILLECTOMY    ? ? ?There were no vitals filed for this visit. ? ? Subjective Assessment - 07/02/21 0905   ? ? Subjective "I'm good"   ? Patient is accompained by: Family member   ? Currently in Pain? No/denies   ? ?  ?  ? ?  ? ? ? ? ? ? ? ? ADULT SLP TREATMENT - 07/01/21 1445   ? ?  ? General Information  ? Behavior/Cognition Alert;Pleasant mood;Impulsive;Distractible;Requires cueing   ?  ? Treatment Provided  ? Treatment provided Cognitive-Linquistic   ?  ? Cognitive-Linquistic Treatment  ? Treatment focused on Aphasia;Cognition   ? Skilled Treatment Pt returned with HEP completed. In conversation, pt benefitted from visual communication  supports to aid naming related to personally relevant topics. Occasional redirection and intermittent min cuing to locate pertinent information. SLP continued to modify communciation/memory aid to optimize usage and carryover.   ?  ? Assessment / Recommendations / Plan  ? Plan Continue with current plan of care   ?  ? Progression Toward Goals  ? Progression toward goals Progressing toward goals   ? ?  ?  ? ?  ? ? ? SLP Education - 07/02/21 0919   ? ? Education Details HEP, communication/memory book cues and modification   ? Person(s) Educated Patient;Spouse   ? Methods Explanation;Demonstration;Verbal cues;Handout   ? Comprehension Verbalized understanding;Returned demonstration;Need further instruction   ? ?  ?  ? ?  ? ? ? SLP Short Term Goals - 07/02/21 0921   ? ?  ? SLP SHORT TERM GOAL #1  ? Title Care partner will demonstrate appropriate cuing techniques with occasional min A across 2 sessions   ? Baseline 06-22-21, 06-24-21   ? Status Achieved   ?  ? SLP SHORT TERM GOAL #2  ? Title Pt will self-identify episodes of anomia or dysnomia with 50% accuracy on structured speech tasks with occasional mod A over 2 sessions   ? Baseline 07-01-21   ? Time 2   ? Period  Weeks   ? Status On-going   ?  ? SLP SHORT TERM GOAL #3  ? Title pt will use mutlimodal communication to support verbal expression (gestures, writing, drawing, picture symbols) 3/5 opportunities with occasional mod A across 2 sessions   ? Baseline 06-24-21   ? Time 2   ? Period Weeks   ? Status On-going   ?  ? SLP SHORT TERM GOAL #4  ? Title pt/spouse will report implementation of 2 external aids to support orientation and memory with mod I   ? Time 2   ? Period Weeks   ? Status On-going   ? ?  ?  ? ?  ? ? ? SLP Long Term Goals - 07/02/21 0921   ? ?  ? SLP LONG TERM GOAL #1  ? Title Care partner will report implementation of appropriate cuing techniques at home over 1 week period   ? Time 10   ? Period Weeks   ? Status On-going   ?  ? SLP LONG TERM GOAL #2  ?  Title Pt will self-identify episodes of anomia or dysnomia and attempt to correct during 15 minute conversation with occasional min A over 2 sessions   ? Time 10   ? Period Weeks   ? Status On-going   ?  ? SLP LONG TERM GOAL #3  ? Title Spouse will report pt using mutlimodal communication to support verbal expression (gestures, writing, drawing, picture symbols) in conversation at home with occasional min A over 1 week   ? Time 10   ? Period Weeks   ? Status On-going   ?  ? SLP LONG TERM GOAL #4  ? Title pt/spouse will report implementation of 4 external aids to support orientation and memory with mod I over 1 week   ? Time 10   ? Period Weeks   ? Status On-going   ? ?  ?  ? ?  ? ? ? Plan - 07/02/21 0920   ? ? Clinical Impression Statement Finnian has dx of PPA and cognitive impairment. SLP conducted ongoing education and training of recommended cues and external aids to support patient during word finding episodes re: personally relevant topics. Memory/communication support modifications continued this session to aid word finding and functional communication as PPA progresses. Pt has begun independently implementing communication support at home and intermittently in therapy sessions to support communication. Skilled ST is recommended to address pt's cogntitive linguisitc deficits, maintain current skills, and implement compensatory strategies in advance of potential progression of disease.   ? Speech Therapy Frequency 2x / week   ? Duration 12 weeks   ? Treatment/Interventions Compensatory strategies;Cueing hierarchy;Functional tasks;Patient/family education;Cognitive reorganization;Environmental controls;Multimodal communcation approach;SLP instruction and feedback;Internal/external aids;Compensatory techniques;Language facilitation   ? Potential to [place]   ? Potential Considerations Medical prognosis   ? Consulted and Agree with Plan of Care Patient;Family member/caregiver   ? Family Member Consulted  wife, [name]   ? ?  ?  ? ?  ? ? ?Patient will benefit from skilled therapeutic intervention in order to improve the following deficits and impairments:   ?Aphasia ? ?Cognitive communication deficit ? ? ? ?Problem List ?Patient Active Problem List  ? Diagnosis Date Noted  ? Encounter for therapeutic drug monitoring 01/10/2017  ? Obesity 11/05/2010  ? HTN (hypertension) 11/05/2010  ? Hyperlipidemia 11/05/2010  ? Atrial fibrillation ([place]) 07/13/2010  ? ? ?John Buckley, CCC-SLP ?07/02/2021, 9:22 AM ? ?[facility] ?[facility] ?[place], [place], [address] ?Phone: [phone]   Fax:  [phone] ? ? ?Name: RONAN DUECKER ?MRN: [id number] ?Date of Birth: 09/20/1945 ? ?

## 2021-07-05 ENCOUNTER — Encounter: Payer: Self-pay | Admitting: Cardiovascular Disease

## 2021-07-05 MED ORDER — METOPROLOL SUCCINATE ER 25 MG PO TB24: 25.0000 mg | ORAL_TABLET | Freq: Every day | ORAL | 0 refills | Status: DC

## 2021-07-13 ENCOUNTER — Ambulatory Visit: Payer: Medicare Other

## 2021-07-13 ENCOUNTER — Other Ambulatory Visit: Payer: Self-pay

## 2021-07-13 DIAGNOSIS — R4701 Aphasia: Secondary | ICD-10-CM | POA: Diagnosis not present

## 2021-07-13 DIAGNOSIS — R41841 Cognitive communication deficit: Secondary | ICD-10-CM

## 2021-07-14 NOTE — Therapy (Signed)
[facility] ?[facility] ?[place], [place], [address] ?Phone: [phone]   Fax:  [phone] ? ?Speech Language Pathology Treatment ? ?Patient Details  ?Name: John Buckley ?MRN: [id number] ?Date of Birth: Dec 28, 1945 ?Referring Provider (SLP): Dr. Delice Lesch, Santiago Glad ? ? ?Encounter Date: 07/13/2021 ? ? End of Session - 07/13/21 1354   ? ? Visit Number 8   ? Number of Visits 25   ? Date for SLP Re-Evaluation 09/10/21   ? Authorization Type medicare   ? SLP Start Time 1356   ? SLP Stop Time  1445   ? SLP Time Calculation (min) 49 min   ? Activity Tolerance Patient tolerated treatment well   ? ?  ?  ? ?  ? ? ?Past Medical History:  ?Diagnosis Date  ? Atrial fibrillation ([place])   ? Hyperlipidemia   ? Hypertension   ? Long-term (current) use of anticoagulants   ? Tobacco abuse   ? ? ?Past Surgical History:  ?Procedure Laterality Date  ? CARDIAC CATHETERIZATION  01/15/2003  ? Est. EF is around 65% --  Relatively smooth coronary arteries.  He has a few minor luminal irregularities -- Normal left ventricular systolic function -- Thayer Headings, M.D.  ? CARDIOVERSION  08/04/20006  ? was successful  ? COLONOSCOPY W/ POLYPECTOMY  12/02/2003  ? A moderate-sized rectal polyp at 15 cm -- ohn C. Amedeo Plenty, M.D.  ? NOSE SURGERY    ? TONSILLECTOMY    ? ? ?There were no vitals filed for this visit. ? ? Subjective Assessment - 07/13/21 1355   ? ? Subjective "I got bad stuff"   ? Patient is accompained by: Family member   ? Currently in Pain? No/denies   ? ?  ?  ? ?  ? ? ? ? ? ? ? ? ADULT SLP TREATMENT - 07/13/21 1354   ? ?  ? General Information  ? Behavior/Cognition Alert;Pleasant mood;Impulsive;Distractible;Requires cueing   ?  ? Treatment Provided  ? Treatment provided Cognitive-Linquistic   ?  ? Cognitive-Linquistic Treatment  ? Treatment focused on Aphasia;Cognition   ? Skilled Treatment Pt presents with some improved ability to converse with therapist re: recent events. Some  improving awareness of anomia/dysnomia exhibited. Wife continues to provide cuing versus words to aid word finding. SLP utilized written cues and/or phonemic cues to aid anomia when it occured. SLP updated and printed communication support with education provided to patient and wife re: how to implement at home. Pt's wife reported pt presents with improved functional naming (i.e., daughters names) since initaiton of ST services. Generated idea of "cheat sheet" at home with pertinent information beside armchair as well as recommended visual supports and specific placement of communication support to aid carryover.   ?  ? Assessment / Recommendations / Plan  ? Plan Continue with current plan of care   ?  ? Progression Toward Goals  ? Progression toward goals Progressing toward goals   ? ?  ?  ? ?  ? ? ? SLP Education - 07/14/21 0918   ? ? Education Details communication book usage/placement/modification   ? Person(s) Educated Patient;Spouse   ? Methods Explanation;Demonstration;Verbal cues;Handout   ? Comprehension Verbalized understanding;Returned demonstration;Verbal cues required;Need further instruction   ? ?  ?  ? ?  ? ? ? SLP Short Term Goals - 07/13/21 1354   ? ?  ? SLP SHORT TERM GOAL #1  ? Title Care partner will demonstrate appropriate cuing techniques with occasional  min A across 2 sessions   ? Baseline 06-22-21, 06-24-21   ? Status Achieved   ?  ? SLP SHORT TERM GOAL #2  ? Title Pt will self-identify episodes of anomia or dysnomia with 50% accuracy on structured speech tasks with occasional mod A over 2 sessions   ? Baseline 07-01-21, 07-14-21   ? Time 1   ? Period Weeks   ? Status Achieved   ?  ? SLP SHORT TERM GOAL #3  ? Title pt will use mutlimodal communication to support verbal expression (gestures, writing, drawing, picture symbols) 3/5 opportunities with occasional mod A across 2 sessions   ? Baseline 06-24-21   ? Time 1   ? Period Weeks   ? Status On-going   ?  ? SLP SHORT TERM GOAL #4  ? Title pt/spouse  will report implementation of 2 external aids to support orientation and memory with mod I   ? Baseline 07-14-21   ? Time 1   ? Period Weeks   ? Status On-going   ? ?  ?  ? ?  ? ? ? SLP Long Term Goals - 07/13/21 1355   ? ?  ? SLP LONG TERM GOAL #1  ? Title Care partner will report implementation of appropriate cuing techniques at home over 1 week period   ? Time 9   ? Period Weeks   ? Status On-going   ?  ? SLP LONG TERM GOAL #2  ? Title Pt will self-identify episodes of anomia or dysnomia and attempt to correct during 15 minute conversation with occasional min A over 2 sessions   ? Time 9   ? Period Weeks   ? Status On-going   ?  ? SLP LONG TERM GOAL #3  ? Title Spouse will report pt using mutlimodal communication to support verbal expression (gestures, writing, drawing, picture symbols) in conversation at home with occasional min A over 1 week   ? Time 9   ? Period Weeks   ? Status On-going   ?  ? SLP LONG TERM GOAL #4  ? Title pt/spouse will report implementation of 4 external aids to support orientation and memory with mod I over 1 week   ? Time 9   ? Period Weeks   ? Status On-going   ? ?  ?  ? ?  ? ? ? Plan - 07/13/21 1354   ? ? 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 SLP conducted ongoing education and training of recommended cues and external aids to support patient during word finding episodes re: personally relevant topics. Memory/communication support modifications continued this session to aid word finding and functional communication as PPA progresses. Pt has begun independently implementing communication support at home and intermittently in therapy sessions to support communication. Skilled ST is recommended to address pt's cogntitive linguisitc deficits, maintain current skills, and implement compensatory strategies in advance of potential progression of disease.   ? Speech Therapy Frequency 2x / week   ? Duration 12 weeks   ? Treatment/Interventions Compensatory  strategies;Cueing hierarchy;Functional tasks;Patient/family education;Cognitive reorganization;Environmental controls;Multimodal communcation approach;SLP instruction and feedback;Internal/external aids;Compensatory techniques;Language facilitation   ? Potential to [place]   ? Potential Considerations Medical prognosis   ? Consulted and Agree with Plan of Care Patient;Family member/caregiver   ? Family Member Consulted wife, John Buckley   ? ?  ?  ? ?  ? ? ?Patient will benefit from skilled therapeutic intervention in order to improve  the following deficits and impairments:   ?Aphasia ? ?Cognitive communication deficit ? ? ? ?Problem List ?Patient Active Problem List  ? Diagnosis Date Noted  ? Encounter for therapeutic drug monitoring 01/10/2017  ? Obesity 11/05/2010  ? HTN (hypertension) 11/05/2010  ? Hyperlipidemia 11/05/2010  ? Atrial fibrillation ([place]) 07/13/2010  ? ? ?Marzetta Board, CCC-SLP ?07/14/2021, 9:19 AM ? ?[facility] ?[place] ?[place], [place], [address] ?Phone: [phone]   Fax:  [phone] ? ? ?Name: John Buckley ?MRN: [id number] ?Date of Birth: 1945-12-31 ? ?

## 2021-07-15 ENCOUNTER — Ambulatory Visit: Payer: Medicare Other

## 2021-07-15 DIAGNOSIS — R4701 Aphasia: Secondary | ICD-10-CM | POA: Diagnosis not present

## 2021-07-15 DIAGNOSIS — R41841 Cognitive communication deficit: Secondary | ICD-10-CM | POA: Diagnosis not present

## 2021-07-15 NOTE — Therapy (Signed)
[facility] ?[address] ?[place], [place], [address] ?Phone: [phone]   Fax:  [phone] ? ?Speech Language Pathology Treatment ? ?Patient Details  ?Name: John Buckley ?MRN: [id number] ?Date of Birth: 1946-04-07 ?Referring Provider (SLP): Dr. Delice Lesch, Santiago Glad ? ? ?Encounter Date: 07/15/2021 ? ? End of Session - 07/15/21 1311   ? ? Visit Number 9   ? Number of Visits 25   ? Date for SLP Re-Evaluation 09/10/21   ? Authorization Type medicare   ? SLP Start Time 1315   ? SLP Stop Time  1400   ? SLP Time Calculation (min) 45 min   ? Activity Tolerance Patient tolerated treatment well   ? ?  ?  ? ?  ? ? ?Past Medical History:  ?Diagnosis Date  ? Atrial fibrillation ([place])   ? Hyperlipidemia   ? Hypertension   ? Long-term (current) use of anticoagulants   ? Tobacco abuse   ? ? ?Past Surgical History:  ?Procedure Laterality Date  ? CARDIAC CATHETERIZATION  01/15/2003  ? Est. EF is around 65% --  Relatively smooth coronary arteries.  He has a few minor luminal irregularities -- Normal left ventricular systolic function -- Thayer Headings, M.D.  ? CARDIOVERSION  08/04/20006  ? was successful  ? COLONOSCOPY W/ POLYPECTOMY  12/02/2003  ? A moderate-sized rectal polyp at 15 cm -- ohn C. Amedeo Plenty, M.D.  ? NOSE SURGERY    ? TONSILLECTOMY    ? ? ?There were no vitals filed for this visit. ? ? Subjective Assessment - 07/15/21 1312   ? ? Subjective "nothing much"   ? Currently in Pain? No/denies   ? ?  ?  ? ?  ? ? ? ? ? ? ? ? ADULT SLP TREATMENT - 07/15/21 1311   ? ?  ? General Information  ? Behavior/Cognition Alert;Pleasant mood;Distractible;Requires cueing   ?  ? Treatment Provided  ? Treatment provided Cognitive-Linquistic   ?  ? Cognitive-Linquistic Treatment  ? Treatment focused on Aphasia;Cognition   ? Skilled Treatment In conversation, pt able to occasionally self-correct anomia with extra processing time. Pt continues to benefit from communication support as well  as  semantic and phonemic cues from SLP and wife when anomia occurs, although less reliance noted on wife for communication of personally relevant topics. Wife reported patient engaged in telephone conversation without needing wife's assistance. SLP recommeded wife provide visuals/pictures to include in communication/memory book.   ?  ? Assessment / Recommendations / Plan  ? Plan Continue with current plan of care   ?  ? Progression Toward Goals  ? Progression toward goals Progressing toward goals   ? ?  ?  ? ?  ? ? ? SLP Education - 07/15/21 1554   ? ? Education Details communication/memory book   ? Person(s) Educated Patient;Spouse   ? Methods Explanation;Demonstration;Verbal cues   ? Comprehension Verbalized understanding;Returned demonstration;Verbal cues required;Need further instruction   ? ?  ?  ? ?  ? ? ? SLP Short Term Goals - 07/15/21 1556   ? ?  ? SLP SHORT TERM GOAL #1  ? Title Care partner will demonstrate appropriate cuing techniques with occasional min A across 2 sessions   ? Baseline 06-22-21, 06-24-21   ? Status Achieved   ?  ? SLP SHORT TERM GOAL #2  ? Title Pt will self-identify episodes of anomia or dysnomia with 50% accuracy on structured speech tasks with occasional mod A over 2 sessions   ?  Baseline 07-01-21, 07-14-21   ? Status Achieved   ?  ? SLP SHORT TERM GOAL #3  ? Title pt will use mutlimodal communication to support verbal expression (gestures, writing, drawing, picture symbols) 3/5 opportunities with occasional mod A across 2 sessions   ? Baseline 06-24-21; 07-15-21   ? Status Achieved   ?  ? SLP SHORT TERM GOAL #4  ? Title pt/spouse will report implementation of 2 external aids to support orientation and memory with mod I   ? Baseline 07-14-21   ? Time 1   ? Period Weeks   ? Status On-going   ? ?  ?  ? ?  ? ? ? SLP Long Term Goals - 07/15/21 1312   ? ?  ? SLP LONG TERM GOAL #1  ? Title Care partner will report implementation of appropriate cuing techniques at home over 1 week period   ? Time 9    ? Period Weeks   ? Status On-going   ?  ? SLP LONG TERM GOAL #2  ? Title Pt will self-identify episodes of anomia or dysnomia and attempt to correct during 15 minute conversation with occasional min A over 2 sessions   ? Time 9   ? Period Weeks   ? Status On-going   ?  ? SLP LONG TERM GOAL #3  ? Title Spouse will report pt using mutlimodal communication to support verbal expression (gestures, writing, drawing, picture symbols) in conversation at home with occasional min A over 1 week   ? Time 9   ? Period Weeks   ? Status On-going   ?  ? SLP LONG TERM GOAL #4  ? Title pt/spouse will report implementation of 4 external aids to support orientation and memory with mod I over 1 week   ? Time 9   ? Period Weeks   ? Status On-going   ? ?  ?  ? ?  ? ? ? Plan - 07/15/21 1312   ? ? 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 SLP conducted ongoing education and training of recommended cues and external aids to support patient during word finding episodes re: personally relevant topics. Memory/communication support modifications continued this session to aid word finding and functional communication as PPA progresses. Pt has begun independently implementing communication support at home and intermittently in therapy sessions to support communication with good accuracy. Skilled ST is recommended to address pt's cogntitive linguisitc deficits, maintain current skills, and implement compensatory strategies in advance of potential progression of disease.   ? Speech Therapy Frequency 2x / week   ? Duration 12 weeks   ? Treatment/Interventions Compensatory strategies;Cueing hierarchy;Functional tasks;Patient/family education;Cognitive reorganization;Environmental controls;Multimodal communcation approach;SLP instruction and feedback;Internal/external aids;Compensatory techniques;Language facilitation   ? Potential to [place]   ? Potential Considerations Medical prognosis   ? Consulted and  Agree with Plan of Care Patient;Family member/caregiver   ? Family Member Consulted wife, [name]   ? ?  ?  ? ?  ? ? ?Patient will benefit from skilled therapeutic intervention in order to improve the following deficits and impairments:   ?Aphasia ? ?Cognitive communication deficit ? ? ? ?Problem List ?Patient Active Problem List  ? Diagnosis Date Noted  ? Encounter for therapeutic drug monitoring 01/10/2017  ? Obesity 11/05/2010  ? HTN (hypertension) 11/05/2010  ? Hyperlipidemia 11/05/2010  ? Atrial fibrillation ([place]) 07/13/2010  ? ? ?Marzetta Board, CCC-SLP ?07/15/2021, 3:57 PM ? ?[facility] ?[place] ?  [place], [place], [address] ?Phone: [phone]   Fax:  [phone] ? ? ?Name: John Buckley ?MRN: [id number] ?Date of Birth: [birth year] ? ?

## 2021-07-20 ENCOUNTER — Ambulatory Visit: Payer: Medicare Other | Attending: Neurology

## 2021-07-20 DIAGNOSIS — R41841 Cognitive communication deficit: Secondary | ICD-10-CM | POA: Diagnosis not present

## 2021-07-20 DIAGNOSIS — R4701 Aphasia: Secondary | ICD-10-CM | POA: Diagnosis not present

## 2021-07-20 NOTE — Therapy (Signed)
[facility] ?[facility] [place] ?[place], [place], [address] ?Phone: [phone]   Fax:  [phone] ? ?Speech Language Pathology Treatment/Progress Note ? ?Patient Details  ?Name: John Buckley ?MRN: [id number] ?Date of Birth: 1946/04/17 ?Referring Provider (SLP): Dr. Delice Lesch, Santiago Glad ? ? ?Encounter Date: 07/20/2021 ? ? End of Session - 07/20/21 1801   ? ? Visit Number 10   ? Number of Visits 25   ? Date for SLP Re-Evaluation 09/10/21   ? Authorization Type medicare   ? SLP Start Time 1402   ? SLP Stop Time  1445   ? SLP Time Calculation (min) 43 min   ? Activity Tolerance Patient tolerated treatment well   ? ?  ?  ? ?  ? ? ?Past Medical History:  ?Diagnosis Date  ? Atrial fibrillation ([place])   ? Hyperlipidemia   ? Hypertension   ? Long-term (current) use of anticoagulants   ? Tobacco abuse   ? ? ?Past Surgical History:  ?Procedure Laterality Date  ? CARDIAC CATHETERIZATION  01/15/2003  ? Est. EF is around 65% --  Relatively smooth coronary arteries.  He has a few minor luminal irregularities -- Normal left ventricular systolic function -- Thayer Headings, M.D.  ? CARDIOVERSION  08/04/20006  ? was successful  ? COLONOSCOPY W/ POLYPECTOMY  12/02/2003  ? A moderate-sized rectal polyp at 15 cm -- ohn C. Amedeo Plenty, M.D.  ? NOSE SURGERY    ? TONSILLECTOMY    ? ? ?There were no vitals filed for this visit. ? ? Subjective Assessment - 07/20/21 1405   ? ? Subjective "very good"   ? Currently in Pain? No/denies   ? ?  ?  ? ?  ? ? ? ?Speech Therapy Progress Note ? ?Dates of Reporting Period: 06-10-21 to current ? ?Objective Reports of Subjective Statement: Pt has been seen for 10 ST visits targeting aphasia and cognition. His wife reports improved word finding of personally relevant words at home.  ? ?Objective Measurements: Pt presents with improving communication effectiveness and ability to converse in personally relevant conversations with intermittent use of anomia  strategies and exrenal communication supports to aid word finding.  ? ?Goal Update: see goals below ? ?Plan: continue per POC ? ?Reason Skilled Services are Required: Pt would continue to benefit from skilled ST services given good response to ST intervention to maximize verbal expression.  ? ? ? ? ? ADULT SLP TREATMENT - 07/20/21 1754   ? ?  ? General Information  ? Behavior/Cognition Alert;Pleasant mood;Distractible;Requires cueing;Cooperative   ?  ? Treatment Provided  ? Treatment provided Cognitive-Linquistic   ?  ? Cognitive-Linquistic Treatment  ? Treatment focused on Aphasia;Cognition   ? Skilled Treatment Wife reported patient utilized external communication support for ordering. Wife provided A to initiate communication exchange for ordering but pt able to successfully complete. SLP targeted use of communication support in conversation, in which pt able to utilize communication support with usual mod fading to rare min A to aid word finding re: personally relevant topics. Pt benefits from written and phonemic cues when anomia occurs, in which wife able to provide with improved accuracy. Pt becoming more aware of communication breakdown (pausing to think, saying it in another way) compared to initial baseline.   ?  ? Assessment / Recommendations / Plan  ? Plan Continue with current plan of care   ?  ? Progression Toward Goals  ? Progression toward goals Progressing toward goals   ? ?  ?  ? ?  ? ? ?  SLP Education - 07/20/21 1753   ? ? Education Details HEP (read communication support daily)   ? Person(s) Educated Patient;Spouse   ? Methods Explanation;Demonstration;Verbal cues;Handout   ? Comprehension Verbalized understanding;Returned demonstration;Verbal cues required;Other (comment)   ? ?  ?  ? ?  ? ? ? SLP Short Term Goals - 07/20/21 1803   ? ?  ? SLP SHORT TERM GOAL #1  ? Title Care partner will demonstrate appropriate cuing techniques with occasional min A across 2 sessions   ? Baseline 06-22-21, 06-24-21    ? Status Achieved   ?  ? SLP SHORT TERM GOAL #2  ? Title Pt will self-identify episodes of anomia or dysnomia with 50% accuracy on structured speech tasks with occasional mod A over 2 sessions   ? Baseline 07-01-21, 07-14-21   ? Status Achieved   ?  ? SLP SHORT TERM GOAL #3  ? Title pt will use mutlimodal communication to support verbal expression (gestures, writing, drawing, picture symbols) 3/5 opportunities with occasional mod A across 2 sessions   ? Baseline 06-24-21; 07-15-21   ? Status Achieved   ?  ? SLP SHORT TERM GOAL #4  ? Title pt/spouse will report implementation of 2 external aids to support orientation and memory with mod I   ? Baseline 07-14-21; 07-20-21   ? Status Achieved   ? ?  ?  ? ?  ? ? ? SLP Long Term Goals - 07/20/21 1803   ? ?  ? SLP LONG TERM GOAL #1  ? Title Care partner will report implementation of appropriate cuing techniques at home over 1 week period   ? Time 8   ? Period Weeks   ? Status On-going   ?  ? SLP LONG TERM GOAL #2  ? Title Pt will self-identify episodes of anomia or dysnomia and attempt to correct during 15 minute conversation with occasional min A over 2 sessions   ? Baseline 07-20-21   ? Time 8   ? Period Weeks   ? Status On-going   ?  ? SLP LONG TERM GOAL #3  ? Title Spouse will report pt using mutlimodal communication to support verbal expression (gestures, writing, drawing, picture symbols) in conversation at home with occasional min A over 1 week   ? Time 8   ? Period Weeks   ? Status On-going   ?  ? SLP LONG TERM GOAL #4  ? Title pt/spouse will report implementation of 4 external aids to support orientation and memory with mod I over 1 week   ? Time 8   ? Period Weeks   ? Status On-going   ? ?  ?  ? ?  ? ? ? Plan - 07/20/21 1801   ? ? 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 SLP conducted ongoing education and training of recommended cues and external aids to support patient during word finding episodes re: personally relevant topics.  Memory/communication support training continued this session to aid word finding and functional communication as PPA progresses. Pt has begun independently implementing communication support at home and increasingly in therapy sessions to support communication with good accuracy. Skilled ST is recommended to address pt's cogntitive linguisitc deficits, maintain current skills, and implement compensatory strategies in advance of potential progression of disease.   ? Speech Therapy Frequency 2x / week   ? Duration 12 weeks   ? Treatment/Interventions Compensatory strategies;Cueing hierarchy;Functional tasks;Patient/family education;Cognitive reorganization;Environmental controls;Multimodal communcation approach;SLP instruction and feedback;Internal/external  aids;Compensatory techniques;Language facilitation   ? Potential to [place]   ? Potential Considerations Medical prognosis   ? Consulted and Agree with Plan of Care Patient;Family member/caregiver   ? Family Member Consulted wife, John Buckley   ? ?  ?  ? ?  ? ? ?Patient will benefit from skilled therapeutic intervention in order to improve the following deficits and impairments:   ?Aphasia ? ?Cognitive communication deficit ? ? ? ?Problem List ?Patient Active Problem List  ? Diagnosis Date Noted  ? Encounter for therapeutic drug monitoring 01/10/2017  ? Obesity 11/05/2010  ? HTN (hypertension) 11/05/2010  ? Hyperlipidemia 11/05/2010  ? Atrial fibrillation ([place]) 07/13/2010  ? ? ?Marzetta Board, CCC-SLP ?07/20/2021, 6:07 PM ? ?[facility] ?[place] ?[place], [place], [address] ?Phone: [phone]   Fax:  [phone] ? ? ?Name: John Buckley ?MRN: [id number] ?Date of Birth: [birth year] ? ?

## 2021-07-21 ENCOUNTER — Ambulatory Visit (INDEPENDENT_AMBULATORY_CARE_PROVIDER_SITE_OTHER): Payer: Medicare Other | Admitting: *Deleted

## 2021-07-21 DIAGNOSIS — Z5181 Encounter for therapeutic drug level monitoring: Secondary | ICD-10-CM | POA: Diagnosis not present

## 2021-07-21 DIAGNOSIS — I4891 Unspecified atrial fibrillation: Secondary | ICD-10-CM | POA: Diagnosis not present

## 2021-07-21 LAB — POCT INR: INR: 3.3 — AB (ref 2.0–3.0)

## 2021-07-21 NOTE — Patient Instructions (Signed)
Description   ?Do not take any Warfarin tomorrow (already taken today's dose) then continue taking Warfarin 1/2 tablet daily except for 1 tablet on Sundays and Thursdays. Recheck INR in 3 weeks (normally 6 weeks).  [facility] [phone].  ?  ?  ?

## 2021-07-22 ENCOUNTER — Ambulatory Visit: Payer: Medicare Other

## 2021-07-22 DIAGNOSIS — R41841 Cognitive communication deficit: Secondary | ICD-10-CM

## 2021-07-22 DIAGNOSIS — R4701 Aphasia: Secondary | ICD-10-CM | POA: Diagnosis not present

## 2021-07-22 NOTE — Therapy (Signed)
[facility] ?[place] ?[place], [place], [address] ?Phone: [phone]   Fax:  [phone] ? ?Speech Language Pathology Treatment ? ?Patient Details  ?Name: John Buckley ?MRN: [id number] ?Date of Birth: December 20, 1945 ?Referring Provider (SLP): Dr. Delice Lesch, Santiago Glad ? ? ?Encounter Date: 07/22/2021 ? ? End of Session - 07/22/21 1458   ? ? Visit Number 11   ? Number of Visits 25   ? Date for SLP Re-Evaluation 09/10/21   ? Authorization Type medicare   ? SLP Start Time 1403   ? SLP Stop Time  1449   ? SLP Time Calculation (min) 46 min   ? Activity Tolerance Patient tolerated treatment well   ? ?  ?  ? ?  ? ? ?Past Medical History:  ?Diagnosis Date  ? Atrial fibrillation ([place])   ? Hyperlipidemia   ? Hypertension   ? Long-term (current) use of anticoagulants   ? Tobacco abuse   ? ? ?Past Surgical History:  ?Procedure Laterality Date  ? CARDIAC CATHETERIZATION  01/15/2003  ? Est. EF is around 65% --  Relatively smooth coronary arteries.  He has a few minor luminal irregularities -- Normal left ventricular systolic function -- Thayer Headings, M.D.  ? CARDIOVERSION  08/04/20006  ? was successful  ? COLONOSCOPY W/ POLYPECTOMY  12/02/2003  ? A moderate-sized rectal polyp at 15 cm -- ohn C. Amedeo Plenty, M.D.  ? NOSE SURGERY    ? TONSILLECTOMY    ? ? ?There were no vitals filed for this visit. ? ? Subjective Assessment - 07/22/21 1451   ? ? Subjective "I uh watched some people play golf"   ? Patient is accompained by: Family member   ? Currently in Pain? No/denies   ? ?  ?  ? ?  ? ? ? ? ? ? ? ? ADULT SLP TREATMENT - 07/22/21 1451   ? ?  ? General Information  ? Behavior/Cognition Alert;Pleasant mood;Distractible;Requires cueing;Cooperative   ?  ? Treatment Provided  ? Treatment provided Cognitive-Linquistic   ?  ? Cognitive-Linquistic Treatment  ? Treatment focused on Aphasia;Cognition   ? Skilled Treatment In conversation today, pt able to utilize descriptions and gestures  to aid word finding and cue listeners when anomia occurs. Phonemic cues and sentence completions were successful to aid anomia. Increased awareness of anomia/dysnomia and ability to request assistance has greatly improved since initation of ST services. Communication support updated to include additional details. Some resistance to reading over communication book reported at home.   ?  ? Assessment / Recommendations / Plan  ? Plan Continue with current plan of care   ?  ? Progression Toward Goals  ? Progression toward goals Progressing toward goals   ? ?  ?  ? ?  ? ? ? SLP Education - 07/22/21 1458   ? ? Education Details positive encouragment for using description/gesture compensations   ? Person(s) Educated Patient;Spouse   ? Methods Explanation;Demonstration;Verbal cues;Handout   ? Comprehension Verbalized understanding;Returned demonstration;Verbal cues required;Need further instruction   ? ?  ?  ? ?  ? ? ? SLP Short Term Goals - 07/20/21 1803   ? ?  ? SLP SHORT TERM GOAL #1  ? Title Care partner will demonstrate appropriate cuing techniques with occasional min A across 2 sessions   ? Baseline 06-22-21, 06-24-21   ? Status Achieved   ?  ? SLP SHORT TERM GOAL #2  ? Title Pt will self-identify episodes of anomia or  dysnomia with 50% accuracy on structured speech tasks with occasional mod A over 2 sessions   ? Baseline 07-01-21, 07-14-21   ? Status Achieved   ?  ? SLP SHORT TERM GOAL #3  ? Title pt will use mutlimodal communication to support verbal expression (gestures, writing, drawing, picture symbols) 3/5 opportunities with occasional mod A across 2 sessions   ? Baseline 06-24-21; 07-15-21   ? Status Achieved   ?  ? SLP SHORT TERM GOAL #4  ? Title pt/spouse will report implementation of 2 external aids to support orientation and memory with mod I   ? Baseline 07-14-21; 07-20-21   ? Status Achieved   ? ?  ?  ? ?  ? ? ? SLP Long Term Goals - 07/20/21 1803   ? ?  ? SLP LONG TERM GOAL #1  ? Title Care partner will report  implementation of appropriate cuing techniques at home over 1 week period   ? Time 8   ? Period Weeks   ? Status On-going   ?  ? SLP LONG TERM GOAL #2  ? Title Pt will self-identify episodes of anomia or dysnomia and attempt to correct during 15 minute conversation with occasional min A over 2 sessions   ? Baseline 07-20-21   ? Time 8   ? Period Weeks   ? Status On-going   ?  ? SLP LONG TERM GOAL #3  ? Title Spouse will report pt using mutlimodal communication to support verbal expression (gestures, writing, drawing, picture symbols) in conversation at home with occasional min A over 1 week   ? Time 8   ? Period Weeks   ? Status On-going   ?  ? SLP LONG TERM GOAL #4  ? Title pt/spouse will report implementation of 4 external aids to support orientation and memory with mod I over 1 week   ? Time 8   ? Period Weeks   ? Status On-going   ? ?  ?  ? ?  ? ? ? Plan - 07/22/21 1458   ? ? Clinical Impression Statement [place] has dx of PPA and cognitive impairment. SLP conducted ongoing education and training of recommended cues and external aids to support patient during word finding episodes re: personally relevant topics. Memory/communication support training continued this session to aid word finding and functional communication as PPA progresses. Pt has begun independently implementing word finding compensations (description and gestures) and utilizing communication support increasingly in therapy sessions to support communication with good accuracy. Skilled ST is recommended to address pt's cogntitive linguisitc deficits, maintain current skills, and implement compensatory strategies in advance of potential progression of disease.   ? Speech Therapy Frequency 2x / week   ? Duration 12 weeks   ? Treatment/Interventions Compensatory strategies;Cueing hierarchy;Functional tasks;Patient/family education;Cognitive reorganization;Environmental controls;Multimodal communcation approach;SLP instruction and  feedback;Internal/external aids;Compensatory techniques;Language facilitation   ? Potential to [place]   ? Potential Considerations Medical prognosis   ? Consulted and Agree with Plan of Care Patient;Family member/caregiver   ? Family Member Consulted wife, [name]   ? ?  ?  ? ?  ? ? ?Patient will benefit from skilled therapeutic intervention in order to improve the following deficits and impairments:   ?Aphasia ? ?Cognitive communication deficit ? ? ? ?Problem List ?Patient Active Problem List  ? Diagnosis Date Noted  ? Encounter for therapeutic drug monitoring 01/10/2017  ? Obesity 11/05/2010  ? HTN (hypertension) 11/05/2010  ? Hyperlipidemia 11/05/2010  ? Atrial fibrillation (  [place]) 07/13/2010  ? ? ?Marzetta Board, CCC-SLP ?07/22/2021, 3:00 PM ? ?[facility] ?[address] ?[place], [place], [address] ?Phone: [phone]   Fax:  [phone] ? ? ?Name: DAYNA GEURTS ?MRN: [id number] ?Date of Birth: [birth year] ? ?

## 2021-07-27 ENCOUNTER — Ambulatory Visit: Payer: Medicare Other

## 2021-07-27 DIAGNOSIS — R41841 Cognitive communication deficit: Secondary | ICD-10-CM

## 2021-07-27 DIAGNOSIS — R4701 Aphasia: Secondary | ICD-10-CM

## 2021-07-27 NOTE — Therapy (Signed)
[facility] ?[place] ?[place], [place], [address] ?Phone: [phone]   Fax:  [phone] ? ?Speech Language Pathology Treatment ? ?Patient Details  ?Name: John Buckley ?MRN: [id number] ?Date of Birth: 17-Jun-1945 ?Referring Provider (SLP): Dr. Delice Lesch, Santiago Glad ? ? ?Encounter Date: 07/27/2021 ? ? End of Session - 07/27/21 1400   ? ? Visit Number 12   ? Number of Visits 25   ? Date for SLP Re-Evaluation 09/10/21   ? Authorization Type medicare   ? SLP Start Time 1400   ? SLP Stop Time  1445   ? SLP Time Calculation (min) 45 min   ? Activity Tolerance Patient tolerated treatment well   ? ?  ?  ? ?  ? ? ?Past Medical History:  ?Diagnosis Date  ? Atrial fibrillation ([place])   ? Hyperlipidemia   ? Hypertension   ? Long-term (current) use of anticoagulants   ? Tobacco abuse   ? ? ?Past Surgical History:  ?Procedure Laterality Date  ? CARDIAC CATHETERIZATION  01/15/2003  ? Est. EF is around 65% --  Relatively smooth coronary arteries.  He has a few minor luminal irregularities -- Normal left ventricular systolic function -- Thayer Headings, M.D.  ? CARDIOVERSION  08/04/20006  ? was successful  ? COLONOSCOPY W/ POLYPECTOMY  12/02/2003  ? A moderate-sized rectal polyp at 15 cm -- ohn C. Amedeo Plenty, M.D.  ? NOSE SURGERY    ? TONSILLECTOMY    ? ? ?There were no vitals filed for this visit. ? ? Subjective Assessment - 07/27/21 1400   ? ? Subjective "today is pretty good. yesterday was..."   ? Patient is accompained by: Family member   ? Currently in Pain? No/denies   ? ?  ?  ? ?  ? ? ? ? ? ? ? ? ADULT SLP TREATMENT - 07/27/21 1359   ? ?  ? General Information  ? Behavior/Cognition Alert;Pleasant mood;Distractible;Requires cueing;Cooperative   ?  ? Treatment Provided  ? Treatment provided Cognitive-Linquistic   ?  ? Cognitive-Linquistic Treatment  ? Treatment focused on Aphasia;Cognition   ? Skilled Treatment John Buckley, wife, reports increased prompting required to complete  usual tasks (such as mowing lawn). Some difficulty cueing patient in conversation reported due to prompting often diverts or derails patient thought processing. SLP educated importance of patient recognizing anomia and pausing (now becoming more frequent) with ways to assist with anomia (repetition of targeted words, written cues) without interjecting. SLP also noted benefits from overt facial expressions to indicate reduced understanding without overly diverting patient attention.   ?  ? Assessment / Recommendations / Plan  ? Plan Continue with current plan of care   ?  ? Progression Toward Goals  ? Progression toward goals Progressing toward goals   ? ?  ?  ? ?  ? ? ? SLP Education - 07/27/21 1603   ? ? Education Details anomia strategies in conversation   ? Person(s) Educated Patient;Spouse   ? Methods Explanation;Demonstration;Verbal cues;Handout   ? Comprehension Verbalized understanding;Returned demonstration;Verbal cues required;Need further instruction   ? ?  ?  ? ?  ? ? ? SLP Short Term Goals - 07/20/21 1803   ? ?  ? SLP SHORT TERM GOAL #1  ? Title Care partner will demonstrate appropriate cuing techniques with occasional min A across 2 sessions   ? Baseline 06-22-21, 06-24-21   ? Status Achieved   ?  ? SLP SHORT TERM GOAL #2  ?  Title Pt will self-identify episodes of anomia or dysnomia with 50% accuracy on structured speech tasks with occasional mod A over 2 sessions   ? Baseline 07-01-21, 07-14-21   ? Status Achieved   ?  ? SLP SHORT TERM GOAL #3  ? Title pt will use mutlimodal communication to support verbal expression (gestures, writing, drawing, picture symbols) 3/5 opportunities with occasional mod A across 2 sessions   ? Baseline 06-24-21; 07-15-21   ? Status Achieved   ?  ? SLP SHORT TERM GOAL #4  ? Title pt/spouse will report implementation of 2 external aids to support orientation and memory with mod I   ? Baseline 07-14-21; 07-20-21   ? Status Achieved   ? ?  ?  ? ?  ? ? ? SLP Long Term Goals - 07/27/21 1400    ? ?  ? SLP LONG TERM GOAL #1  ? Title Care partner will report implementation of appropriate cuing techniques at home over 1 week period   ? Time 7   ? Period Weeks   ? Status On-going   ?  ? SLP LONG TERM GOAL #2  ? Title Pt will self-identify episodes of anomia or dysnomia and attempt to correct during 15 minute conversation with occasional min A over 2 sessions   ? Baseline 07-20-21   ? Time 7   ? Period Weeks   ? Status On-going   ?  ? SLP LONG TERM GOAL #3  ? Title Spouse will report pt using mutlimodal communication to support verbal expression (gestures, writing, drawing, picture symbols) in conversation at home with occasional min A over 1 week   ? Time 7   ? Period Weeks   ? Status On-going   ?  ? SLP LONG TERM GOAL #4  ? Title pt/spouse will report implementation of 4 external aids to support orientation and memory with mod I over 1 week   ? Time 7   ? Period Weeks   ? Status On-going   ? ?  ?  ? ?  ? ? ? Plan - 07/27/21 1400   ? ? 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 SLP conducted ongoing education and training of recommended cues and external aids to support patient during word finding episodes in conversation of personally relevant topics. Memory/communication support training continued this session to aid word finding and functional communication as PPA progresses. Pt has begun independently implementing word finding compensations (description and gestures) and utilizing communication support increasingly in therapy sessions to support communication with good accuracy. Skilled ST is recommended to address pt's cogntitive linguisitc deficits, maintain current skills, and implement compensatory strategies in advance of potential progression of disease.   ? Speech Therapy Frequency 2x / week   ? Duration 12 weeks   ? Treatment/Interventions Compensatory strategies;Cueing hierarchy;Functional tasks;Patient/family education;Cognitive reorganization;Environmental  controls;Multimodal communcation approach;SLP instruction and feedback;Internal/external aids;Compensatory techniques;Language facilitation   ? Potential to [place]   ? Potential Considerations Medical prognosis   ? Consulted and Agree with Plan of Care Patient;Family member/caregiver   ? Family Member Consulted wife, John Buckley   ? ?  ?  ? ?  ? ? ?Patient will benefit from skilled therapeutic intervention in order to improve the following deficits and impairments:   ?Aphasia ? ?Cognitive communication deficit ? ? ? ?Problem List ?Patient Active Problem List  ? Diagnosis Date Noted  ? Encounter for therapeutic drug monitoring 01/10/2017  ? Obesity 11/05/2010  ? HTN (  hypertension) 11/05/2010  ? Hyperlipidemia 11/05/2010  ? Atrial fibrillation ([place]) 07/13/2010  ? ? ?Marzetta Board, CCC-SLP ?07/27/2021, 4:04 PM ? ?[facility] ?[place] ?[place], [place], [address] ?Phone: [phone]   Fax:  [phone] ? ? ?Name: John Buckley ?MRN: [id number] ?Date of Birth: [birth year] ? ?

## 2021-07-29 ENCOUNTER — Ambulatory Visit: Payer: Medicare Other

## 2021-07-29 DIAGNOSIS — R41841 Cognitive communication deficit: Secondary | ICD-10-CM | POA: Diagnosis not present

## 2021-07-29 DIAGNOSIS — R4701 Aphasia: Secondary | ICD-10-CM | POA: Diagnosis not present

## 2021-07-29 NOTE — Therapy (Signed)
[facility] ?[place] ?[place], [place], [address] ?Phone: [phone]   Fax:  [phone] ? ?Speech Language Pathology Treatment ? ?Patient Details  ?Name: John Buckley ?MRN: [id number] ?Date of Birth: 1946/01/31 ?Referring Provider (SLP): Dr. Delice Lesch, Santiago Glad ? ? ?Encounter Date: 07/29/2021 ? ? End of Session - 07/29/21 1350   ? ? Visit Number 13   ? Number of Visits 25   ? Date for SLP Re-Evaluation 09/10/21   ? Authorization Type medicare   ? SLP Start Time 1355   ? SLP Stop Time  1445   ? SLP Time Calculation (min) 50 min   ? Activity Tolerance Patient tolerated treatment well   ? ?  ?  ? ?  ? ? ?Past Medical History:  ?Diagnosis Date  ? Atrial fibrillation ([place])   ? Hyperlipidemia   ? Hypertension   ? Long-term (current) use of anticoagulants   ? Tobacco abuse   ? ? ?Past Surgical History:  ?Procedure Laterality Date  ? CARDIAC CATHETERIZATION  01/15/2003  ? Est. EF is around 65% --  Relatively smooth coronary arteries.  He has a few minor luminal irregularities -- Normal left ventricular systolic function -- Thayer Headings, M.D.  ? CARDIOVERSION  08/04/20006  ? was successful  ? COLONOSCOPY W/ POLYPECTOMY  12/02/2003  ? A moderate-sized rectal polyp at 15 cm -- ohn C. Amedeo Plenty, M.D.  ? NOSE SURGERY    ? TONSILLECTOMY    ? ? ?There were no vitals filed for this visit. ? ? Subjective Assessment - 07/29/21 1350   ? ? Subjective "I guess okay"   ? Patient is accompained by: Family member   ? Currently in Pain? No/denies   ? ?  ?  ? ?  ? ? ? ? ? ? ? ? ADULT SLP TREATMENT - 07/29/21 1349   ? ?  ? General Information  ? Behavior/Cognition Alert;Pleasant mood;Distractible;Requires cueing;Cooperative   ?  ? Treatment Provided  ? Treatment provided Cognitive-Linquistic   ?  ? Cognitive-Linquistic Treatment  ? Treatment focused on Aphasia;Cognition   ? Skilled Treatment Continued to update and modify communication/memory aid to better customize for personal  needs (script training of favorite stories). Pt able to intermittently utilize external support with occasional moderate prompting. Some difficulty reading exhibited, which improved with initial phonemic cues. Further training warranted to aid patient repetition of targeted word.   ?  ? Assessment / Recommendations / Plan  ? Plan Continue with current plan of care   decrease to 1x/week per family preference  ?  ? Progression Toward Goals  ? Progression toward goals Progressing toward goals   ? ?  ?  ? ?  ? ? ? SLP Education - 07/29/21 1448   ? ? Education Details HEP   ? Person(s) Educated Patient;Spouse   ? Methods Explanation;Demonstration;Verbal cues;Handout   ? Comprehension Verbalized understanding;Returned demonstration;Verbal cues required;Need further instruction   ? ?  ?  ? ?  ? ? ? SLP Short Term Goals - 07/20/21 1803   ? ?  ? SLP SHORT TERM GOAL #1  ? Title Care partner will demonstrate appropriate cuing techniques with occasional min A across 2 sessions   ? Baseline 06-22-21, 06-24-21   ? Status Achieved   ?  ? SLP SHORT TERM GOAL #2  ? Title Pt will self-identify episodes of anomia or dysnomia with 50% accuracy on structured speech tasks with occasional mod A over 2 sessions   ?  Baseline 07-01-21, 07-14-21   ? Status Achieved   ?  ? SLP SHORT TERM GOAL #3  ? Title pt will use mutlimodal communication to support verbal expression (gestures, writing, drawing, picture symbols) 3/5 opportunities with occasional mod A across 2 sessions   ? Baseline 06-24-21; 07-15-21   ? Status Achieved   ?  ? SLP SHORT TERM GOAL #4  ? Title pt/spouse will report implementation of 2 external aids to support orientation and memory with mod I   ? Baseline 07-14-21; 07-20-21   ? Status Achieved   ? ?  ?  ? ?  ? ? ? SLP Long Term Goals - 07/29/21 1350   ? ?  ? SLP LONG TERM GOAL #1  ? Title Care partner will report implementation of appropriate cuing techniques at home over 1 week period   ? Time 7   ? Period Weeks   ? Status On-going   ?   ? SLP LONG TERM GOAL #2  ? Title Pt will self-identify episodes of anomia or dysnomia and attempt to correct during 15 minute conversation with occasional min A over 2 sessions   ? Baseline 07-20-21   ? Time 7   ? Period Weeks   ? Status On-going   ?  ? SLP LONG TERM GOAL #3  ? Title Spouse will report pt using mutlimodal communication to support verbal expression (gestures, writing, drawing, picture symbols) in conversation at home with occasional min A over 1 week   ? Time 7   ? Period Weeks   ? Status On-going   ?  ? SLP LONG TERM GOAL #4  ? Title pt/spouse will report implementation of 4 external aids to support orientation and memory with mod I over 1 week   ? Time 7   ? Period Weeks   ? Status On-going   ? ?  ?  ? ?  ? ? ? Plan - 07/29/21 1350   ? ? 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 SLP conducted ongoing education and training of recommended cues and external aids to support patient during word finding episodes in conversation of personally relevant topics. Memory/communication support modifications and training continued this session to aid word finding and functional communication as PPA progresses. Pt has begun utilizing word finding compensations when cued (description and gestures) and utilizing communication support increasingly in therapy sessions to support communication given intermittent moderate prompting and cues. Skilled ST is recommended to address pt's cogntitive linguisitc deficits, maintain current skills, and implement compensatory strategies in advance of potential progression of disease.   ? Speech Therapy Frequency 2x / week   wife elected to decrease to 1x/week for remaining visits  ? Duration 12 weeks   ? Treatment/Interventions Compensatory strategies;Cueing hierarchy;Functional tasks;Patient/family education;Cognitive reorganization;Environmental controls;Multimodal communcation approach;SLP instruction and feedback;Internal/external  aids;Compensatory techniques;Language facilitation   ? Potential to [place]   ? Potential Considerations Medical prognosis   ? Consulted and Agree with Plan of Care Patient;Family member/caregiver   ? Family Member Consulted wife, [name]   ? ?  ?  ? ?  ? ? ?Patient will benefit from skilled therapeutic intervention in order to improve the following deficits and impairments:   ?Aphasia ? ?Cognitive communication deficit ? ? ? ?Problem List ?Patient Active Problem List  ? Diagnosis Date Noted  ? Encounter for therapeutic drug monitoring 01/10/2017  ? Obesity 11/05/2010  ? HTN (hypertension) 11/05/2010  ? Hyperlipidemia 11/05/2010  ? Atrial fibrillation (  [place]) 07/13/2010  ? ? ?Marzetta Board, CCC-SLP ?07/29/2021, 2:52 PM ? ?[facility] ?[place] ?[place], [place], [address] ?Phone: [phone]   Fax:  [phone] ? ? ?Name: John Buckley ?MRN: [id number] ?Date of Birth: 1945/06/10 ? ?

## 2021-07-29 NOTE — Patient Instructions (Signed)
Write down about your past jobs and specifics  ?(Ex: job titles, location, timeframe, people you worked with)  ?

## 2021-08-03 ENCOUNTER — Ambulatory Visit: Payer: Medicare Other | Admitting: Speech Pathology

## 2021-08-03 DIAGNOSIS — R41841 Cognitive communication deficit: Secondary | ICD-10-CM

## 2021-08-03 DIAGNOSIS — R4701 Aphasia: Secondary | ICD-10-CM

## 2021-08-03 NOTE — Therapy (Signed)
[facility] ?[place] ?[place], [place], [address] ?Phone: [phone]   Fax:  [phone] ? ?Speech Language Pathology Treatment ? ?Patient Details  ?Name: John Buckley ?MRN: [id number] ?Date of Birth: 01/04/1946 ?Referring Provider (SLP): Dr. Delice Lesch, Santiago Glad ? ? ?Encounter Date: 08/03/2021 ? ? End of Session - 08/03/21 1603   ? ? Visit Number 14   ? Number of Visits 25   ? Date for SLP Re-Evaluation 09/10/21   ? Authorization Type medicare   ? SLP Start Time 0092   ? SLP Stop Time  1610   ? SLP Time Calculation (min) 54 min   ? Activity Tolerance Patient tolerated treatment well   ? ?  ?  ? ?  ? ? ?Past Medical History:  ?Diagnosis Date  ? Atrial fibrillation ([place])   ? Hyperlipidemia   ? Hypertension   ? Long-term (current) use of anticoagulants   ? Tobacco abuse   ? ? ?Past Surgical History:  ?Procedure Laterality Date  ? CARDIAC CATHETERIZATION  01/15/2003  ? Est. EF is around 65% --  Relatively smooth coronary arteries.  He has a few minor luminal irregularities -- Normal left ventricular systolic function -- Thayer Headings, M.D.  ? CARDIOVERSION  08/04/20006  ? was successful  ? COLONOSCOPY W/ POLYPECTOMY  12/02/2003  ? A moderate-sized rectal polyp at 15 cm -- ohn C. Amedeo Plenty, M.D.  ? NOSE SURGERY    ? TONSILLECTOMY    ? ? ?There were no vitals filed for this visit. ? ? Subjective Assessment - 08/03/21 1605   ? ? Subjective "I'm good I guess"   ? Patient is accompained by: Family member   ? Currently in Pain? No/denies   ? ?  ?  ? ?  ? ? ? ? ? ? ? ? ADULT SLP TREATMENT - 08/03/21 0001   ? ?  ? General Information  ? Behavior/Cognition Alert;Pleasant mood;Distractible;Requires cueing;Cooperative   ?  ? Treatment Provided  ? Treatment provided Cognitive-Linquistic   ?  ? Cognitive-Linquistic Treatment  ? Treatment focused on Aphasia;Cognition   ? Skilled Treatment Target anomia compensations, error ID, and caegiver cueing throughout conversational  speech. Wife continues to demonstrate appropriate levels of cues, encouraging pt to recall words if able. Education on also accepting appropriate circumlocutions when specific enough to provide context for understanding. Pt with usual word finding difficulties, however demonstrating increased coherence in conversational speech with ability to repair communication breakdowns with ST or caregiver cues. Attempt to refer pt to memory book, however pt unable to maintain topic with introduction of resource.   ?  ? Assessment / Recommendations / Plan  ? Plan Continue with current plan of care   ?  ? Progression Toward Goals  ? Progression toward goals Progressing toward goals   ? ?  ?  ? ?  ? ? ? SLP Education - 08/03/21 1606   ? ? Education Details HEP   ? Person(s) Educated Patient;Spouse   ? Methods Explanation;Demonstration;Handout   ? Comprehension Verbalized understanding;Returned demonstration;Need further instruction   ? ?  ?  ? ?  ? ? ? SLP Short Term Goals - 08/03/21 1604   ? ?  ? SLP SHORT TERM GOAL #1  ? Title Care partner will demonstrate appropriate cuing techniques with occasional min A across 2 sessions   ? Baseline 06-22-21, 06-24-21   ? Status Achieved   ?  ? SLP SHORT TERM GOAL #2  ? Title Pt  will self-identify episodes of anomia or dysnomia with 50% accuracy on structured speech tasks with occasional mod A over 2 sessions   ? Baseline 07-01-21, 07-14-21   ? Status Achieved   ?  ? SLP SHORT TERM GOAL #3  ? Title pt will use mutlimodal communication to support verbal expression (gestures, writing, drawing, picture symbols) 3/5 opportunities with occasional mod A across 2 sessions   ? Baseline 06-24-21; 07-15-21   ? Status Achieved   ?  ? SLP SHORT TERM GOAL #4  ? Title pt/spouse will report implementation of 2 external aids to support orientation and memory with mod I   ? Baseline 07-14-21; 07-20-21   ? Status Achieved   ? ?  ?  ? ?  ? ? ? SLP Long Term Goals - 08/03/21 1604   ? ?  ? SLP LONG TERM GOAL #1  ? Title  Care partner will report implementation of appropriate cuing techniques at home over 1 week period   ? Time 6   ? Period Weeks   ? Status On-going   ?  ? SLP LONG TERM GOAL #2  ? Title Pt will self-identify episodes of anomia or dysnomia and attempt to correct during 15 minute conversation with occasional min A over 2 sessions   ? Baseline 07-20-21   ? Time 6   ? Period Weeks   ? Status On-going   ?  ? SLP LONG TERM GOAL #3  ? Title Spouse will report pt using mutlimodal communication to support verbal expression (gestures, writing, drawing, picture symbols) in conversation at home with occasional min A over 1 week   ? Time 6   ? Period Weeks   ? Status On-going   ?  ? SLP LONG TERM GOAL #4  ? Title pt/spouse will report implementation of 4 external aids to support orientation and memory with mod I over 1 week   ? Time 6   ? Period Weeks   ? Status On-going   ? ?  ?  ? ?  ? ? ? ? ?Patient will benefit from skilled therapeutic intervention in order to improve the following deficits and impairments:   ?Aphasia ? ?Cognitive communication deficit ? ? ? ?Problem List ?Patient Active Problem List  ? Diagnosis Date Noted  ? Encounter for therapeutic drug monitoring 01/10/2017  ? Obesity 11/05/2010  ? HTN (hypertension) 11/05/2010  ? Hyperlipidemia 11/05/2010  ? Atrial fibrillation ([place]) 07/13/2010  ? ? ?Su Monks, CCC-SLP ?08/03/2021, 4:16 PM ? ?[facility] ?[place] ?[place], [place], [address] ?Phone: [phone]   Fax:  [phone] ? ? ?Name: GARIK DIAMANT ?MRN: [id number] ?Date of Birth: 22-Oct-1945 ? ?

## 2021-08-10 ENCOUNTER — Ambulatory Visit: Payer: Medicare Other

## 2021-08-10 DIAGNOSIS — R41841 Cognitive communication deficit: Secondary | ICD-10-CM | POA: Diagnosis not present

## 2021-08-10 DIAGNOSIS — R4701 Aphasia: Secondary | ICD-10-CM

## 2021-08-10 NOTE — Therapy (Signed)
[facility] ?[place] ?[place], [place], [address] ?Phone: [phone]   Fax:  [phone] ? ?Speech Language Pathology Treatment ? ?Patient Details  ?Name: John Buckley ?MRN: [id number] ?Date of Birth: [birth year] ?Referring Provider (SLP): Dr. Delice Lesch, Santiago Glad ? ? ?Encounter Date: 08/10/2021 ? ? End of Session - 08/10/21 1718   ? ? Visit Number 15   ? Number of Visits 25   ? Date for SLP Re-Evaluation 09/10/21   ? Authorization Type medicare   ? SLP Start Time 1534   ? SLP Stop Time  1615   ? SLP Time Calculation (min) 41 min   ? Activity Tolerance Patient tolerated treatment well   ? ?  ?  ? ?  ? ? ?Past Medical History:  ?Diagnosis Date  ? Atrial fibrillation ([place])   ? Hyperlipidemia   ? Hypertension   ? Long-term (current) use of anticoagulants   ? Tobacco abuse   ? ? ?Past Surgical History:  ?Procedure Laterality Date  ? CARDIAC CATHETERIZATION  01/15/2003  ? Est. EF is around 65% --  Relatively smooth coronary arteries.  He has a few minor luminal irregularities -- Normal left ventricular systolic function -- Thayer Headings, M.D.  ? CARDIOVERSION  08/04/20006  ? was successful  ? COLONOSCOPY W/ POLYPECTOMY  12/02/2003  ? A moderate-sized rectal polyp at 15 cm -- ohn C. Amedeo Plenty, M.D.  ? NOSE SURGERY    ? TONSILLECTOMY    ? ? ?There were no vitals filed for this visit. ? ? Subjective Assessment - 08/10/21 1714   ? ? Subjective "pretty good"   ? Patient is accompained by: Family member   ? Currently in Pain? No/denies   ? ?  ?  ? ?  ? ? ? ? ? ? ? ? ADULT SLP TREATMENT - 08/10/21 1714   ? ?  ? General Information  ? Behavior/Cognition Alert;Pleasant mood;Distractible;Requires cueing;Cooperative   ?  ? Treatment Provided  ? Treatment provided Cognitive-Linquistic   ?  ? Cognitive-Linquistic Treatment  ? Treatment focused on Aphasia;Cognition   ? Skilled Treatment Improved naming of family members reported, with rare prompting and cues to utilize  external aid needed today to aid recall/naming. Phonemic cues were occasionally effective. Written cues and repetition effective to aid recall and naming of targeted phrases (ex: ordering at restaurant tonight). SLP provided external aid to support ordering at restaurant after ST session today. Modifications for communication support continued to include prior job history.   ?  ? Assessment / Recommendations / Plan  ? Plan Continue with current plan of care   ?  ? Progression Toward Goals  ? Progression toward goals Progressing toward goals   ? ?  ?  ? ?  ? ? ? SLP Education - 08/10/21 1718   ? ? Education Details functional communication supports   ? Person(s) Educated Patient;Spouse   ? Methods Explanation;Demonstration;Verbal cues;Handout   ? Comprehension Verbalized understanding;Returned demonstration;Need further instruction   ? ?  ?  ? ?  ? ? ? SLP Short Term Goals - 08/03/21 1604   ? ?  ? SLP SHORT TERM GOAL #1  ? Title Care partner will demonstrate appropriate cuing techniques with occasional min A across 2 sessions   ? Baseline 06-22-21, 06-24-21   ? Status Achieved   ?  ? SLP SHORT TERM GOAL #2  ? Title Pt will self-identify episodes of anomia or dysnomia with 50% accuracy on structured speech tasks  with occasional mod A over 2 sessions   ? Baseline 07-01-21, 07-14-21   ? Status Achieved   ?  ? SLP SHORT TERM GOAL #3  ? Title pt will use mutlimodal communication to support verbal expression (gestures, writing, drawing, picture symbols) 3/5 opportunities with occasional mod A across 2 sessions   ? Baseline 06-24-21; 07-15-21   ? Status Achieved   ?  ? SLP SHORT TERM GOAL #4  ? Title pt/spouse will report implementation of 2 external aids to support orientation and memory with mod I   ? Baseline 07-14-21; 07-20-21   ? Status Achieved   ? ?  ?  ? ?  ? ? ? SLP Long Term Goals - 08/10/21 1719   ? ?  ? SLP LONG TERM GOAL #1  ? Title Care partner will report implementation of appropriate cuing techniques at home over 1 week  period   ? Time 5   ? Period Weeks   ? Status On-going   ?  ? SLP LONG TERM GOAL #2  ? Title Pt will self-identify episodes of anomia or dysnomia and attempt to correct during 15 minute conversation with occasional min A over 2 sessions   ? Baseline 07-20-21   ? Time 5   ? Period Weeks   ? Status On-going   ?  ? SLP LONG TERM GOAL #3  ? Title Spouse will report pt using mutlimodal communication to support verbal expression (gestures, writing, drawing, picture symbols) in conversation at home with occasional min A over 1 week   ? Time 5   ? Period Weeks   ? Status On-going   ?  ? SLP LONG TERM GOAL #4  ? Title pt/spouse will report implementation of 4 external aids to support orientation and memory with mod I over 1 week   ? Time 5   ? Period Weeks   ? Status On-going   ? ?  ?  ? ?  ? ? ? Plan - 08/10/21 1718   ? ? 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 SLP conducted ongoing education and training of recommended cues and external aids to support patient during word finding episodes in conversation of personally relevant topics. Memory/communication support modifications and training continued this session to aid word finding and functional communication as PPA progresses. Pt has begun utilizing word finding compensations when cued (description and gestures) and utilizing communication support increasingly in therapy sessions to support communication given intermittent moderate prompting and cues. Skilled ST is recommended to address pt's cogntitive linguisitc deficits, maintain current skills, and implement compensatory strategies in advance of potential progression of disease.   ? Speech Therapy Frequency 2x / week   wife elected to decrease to 1x/week for remaining visits  ? Duration 12 weeks   ? Treatment/Interventions Compensatory strategies;Cueing hierarchy;Functional tasks;Patient/family education;Cognitive reorganization;Environmental controls;Multimodal communcation  approach;SLP instruction and feedback;Internal/external aids;Compensatory techniques;Language facilitation   ? Potential to [place]   ? Potential Considerations Medical prognosis   ? Consulted and Agree with Plan of Care Patient;Family member/caregiver   ? Family Member Consulted wife, John Buckley   ? ?  ?  ? ?  ? ? ?Patient will benefit from skilled therapeutic intervention in order to improve the following deficits and impairments:   ?Aphasia ? ?Cognitive communication deficit ? ? ? ?Problem List ?Patient Active Problem List  ? Diagnosis Date Noted  ? Encounter for therapeutic drug monitoring 01/10/2017  ? Obesity 11/05/2010  ? HTN (  hypertension) 11/05/2010  ? Hyperlipidemia 11/05/2010  ? Atrial fibrillation ([place]) 07/13/2010  ? ? ?Marzetta Board, CCC-SLP ?08/10/2021, 5:19 PM ? ?[facility] ?[place] ?[place], [place], [address] ?Phone: [phone]   Fax:  [phone] ? ? ?Name: John Buckley ?MRN: [id number] ?Date of Birth: [birth year] ? ?

## 2021-08-12 ENCOUNTER — Ambulatory Visit (INDEPENDENT_AMBULATORY_CARE_PROVIDER_SITE_OTHER): Payer: Medicare Other

## 2021-08-12 ENCOUNTER — Encounter: Payer: Self-pay | Admitting: Cardiovascular Disease

## 2021-08-12 ENCOUNTER — Ambulatory Visit (INDEPENDENT_AMBULATORY_CARE_PROVIDER_SITE_OTHER): Payer: Medicare Other | Admitting: Cardiovascular Disease

## 2021-08-12 VITALS — BP 122/72 | HR 42 | Ht 73.0 in | Wt 250.4 lb

## 2021-08-12 DIAGNOSIS — I4891 Unspecified atrial fibrillation: Secondary | ICD-10-CM | POA: Diagnosis not present

## 2021-08-12 DIAGNOSIS — Z5181 Encounter for therapeutic drug level monitoring: Secondary | ICD-10-CM | POA: Diagnosis not present

## 2021-08-12 LAB — POCT INR: INR: 2.8 (ref 2.0–3.0)

## 2021-08-12 NOTE — Progress Notes (Signed)
? ?Cardiology Office Note ? ? ?Date:  08/12/2021  ? ?ID:  John Buckley, DOB Jun 22, 1945, MRN [id number] ? ?PCP:  Lavone Orn, MD  ?Cardiologist:   Mertie Moores, MD  ? ?No chief complaint on file. ? ?1. Atrial fibrillation ?2. Obesity ?3. Hyperlipidemia  ?4. Chronic ankle relation ?5. Hypertension ? ?Previous notes:  ? ?John Buckley is a 76 year old gentleman with history of chronic atrial fibrillation, obesity, hyperlipidemia, chronic anticoagulation, hypertension, and ongoing cigarette smoking. He's done very well since I last saw him. Unfortunately he still smokes about a pack of cigarettes a day. He's not had any particular problems. ? ?During his last visit we increased his lisinopril to 20 mg a day which resulted in a well controlled blood pressure. Unfortunately he  reduced his dose back down to 10 mg a day because he thought he didn't need the extra dose.  He is feeling quite well. He denies any episodes of chest pain or shortness of breath. ? ?July 18, 2012: ? ?John Buckley is doing well.  No chest pain, no dyspnea.  Getting some exercise. ? ?July 19, 2013: ? ?Feeling well.  Exercising some.  Still smoking - 1/2 ppd.   ?  ?August 08, 2014:  ?John Buckley is a 76 y.o. male who presents for follow-up of his atrial fibrillation. ?No CP , no dyspnea.  ?Getting some exercise.  ? ?December 05, 2014:  ? ?John Buckley is seen today for follow-up of his atrial fibrillation. ?No chest pains or shortness breath. He's getting a little bit of exercise. ?Wants to start golfing . Joining silver sneakers  ? ?We changed him to Valsartan 160 mg during the last vist. ?He is tolerating this well.  ? ?Feb. 16, 2017: ? ?No cardiac issues.  Had a mass in his right groin  - looked like a bruise,   Popped and has drained. ?Dr. Laurann [place] has evaluated this  - may have been a cyst ? ?June 29, 2016: ? ?Is doing well ?Got a new dog recently - Pitbull.  ?Has lost 27 lbs walking with the dog.   ? ?Still in atrial fib  ? ?Aug 30, 2017: ? John Buckley is seen  today for follow-up visit.  He remains in atrial fibrillation.  He has a history of hypertension. ?Has been losing some weight.   Has been on a better diet  ?Wt. Is 230 , down from 300 several years ago  ?Feeling well  , has cut back on bread  ?Still smoking .  ? ?July 21, 2019: ?John Buckley is seen today  ?Has lost 15 more lbs. ?Wt today is 215 lbs.  ?Has stopped smoking .    ?  ?July 27, 2020 ?John Buckley is seen today for follow up of his Atrial fib, HTN, obesity ?Has developed some somantic dementia .  ?Wife says he is doing ok .   ?Has stopped smoking .  ?Wt today is  230 lbs  ?Takes his dog to the park .  ?INR levels look good  ? ?August 12, 2021: ? ?John Buckley is seen today for follow-up visit regarding his atrial fibrillation, hypertension, obesity.  He is lost quite a bit of weight over the years.  He is developed some dementia. ? ?Takes the dog to the dog park  ?[employer] work  ?Not much exercise  ? ? ?Past Medical History:  ?Diagnosis Date  ? Atrial fibrillation ([place])   ? Hyperlipidemia   ? Hypertension   ? Long-term (current) use of anticoagulants   ? Tobacco  abuse   ? ? ?Past Surgical History:  ?Procedure Laterality Date  ? CARDIAC CATHETERIZATION  01/15/2003  ? Est. EF is around 65% --  Relatively smooth coronary arteries.  He has a few minor luminal irregularities -- Normal left ventricular systolic function -- Thayer Headings, M.D.  ? CARDIOVERSION  08/04/20006  ? was successful  ? COLONOSCOPY W/ POLYPECTOMY  12/02/2003  ? A moderate-sized rectal polyp at 15 cm -- ohn C. Amedeo Plenty, M.D.  ? NOSE SURGERY    ? TONSILLECTOMY    ? ? ? ?Current Outpatient Medications  ?Medication Sig Dispense Refill  ? amLODipine (NORVASC) 5 MG tablet Take 5 mg by mouth daily.    ? atorvastatin (LIPITOR) 40 MG tablet Take 1 tablet (40 mg total) by mouth daily. 90 tablet 3  ? BETAMETHASONE PO Take by mouth as needed.    ? Cholecalciferol (VITAMIN D3) 2000 UNITS capsule Take 2,000 Units by mouth daily.    ? donepezil (ARICEPT) 10 MG tablet Take 1  tablet daily 90 tablet 2  ? escitalopram (LEXAPRO) 10 MG tablet Take 1 tablet (10 mg total) by mouth daily. 90 tablet 2  ? loperamide (IMODIUM A-D) 2 MG tablet 1 tablet as needed    ? losartan (COZAAR) 100 MG tablet Take 100 mg by mouth daily.    ? memantine (NAMENDA) 10 MG tablet Take 1 tablet twice a day 180 tablet 0  ? multivitamin (THERAGRAN) per tablet Take 1 tablet by mouth daily. Takes two per week    ? warfarin (COUMADIN) 5 MG tablet TAKE 1/2 TO 1 TABLET ONCE DAILY AS DIRECTED BY COUMADIN CLINIC 90 tablet 1  ? ?No current facility-administered medications for this visit.  ? ? ?Allergies:   Lisinopril  ? ? ?Social History:  The patient  reports that he has quit smoking. His smoking use included cigarettes. He has a 19.50 pack-year smoking history. He has never used smokeless tobacco. He reports current alcohol use. He reports that he does not use drugs.  ? ?Family History:  The patient's family history includes Alzheimer's disease in his mother; Colon cancer in an other family member.  ? ? ?ROS: Noted in current history, otherwise review of systems is negative. ? ? ?Physical Exam: ?Blood pressure 122/72, pulse (!) 42, height '6\' 1"'$  (1.854 m), weight 250 lb 6.4 oz (113.6 kg), SpO2 97 %. ? ?GEN:  Well nourished, well developed in no acute distress ?HEENT: Normal ?NECK: No JVD; No carotid bruits ?LYMPHATICS: No lymphadenopathy ?CARDIAC: RRR , no murmurs, rubs, gallops ?RESPIRATORY:  Clear to auscultation without rales, wheezing or rhonchi  ?ABDOMEN: Soft, non-tender, non-distended ?MUSCULOSKELETAL:  No edema; No deformity  ?SKIN: Warm and dry ?NEUROLOGIC:  Alert and oriented x 3 ? ? ? ?EKG:    August 12, 2021: Atrial fibrillation with a slow ventricular response of 42 ? ?Recent Labs: ?No results found for requested labs within last 8760 hours.  ? ? ?Lipid Panel ?   ?Component Value Date/Time  ? CHOL 129 08/30/2017 1019  ? TRIG 149 08/30/2017 1019  ? HDL 46 08/30/2017 1019  ? CHOLHDL 2.8 08/30/2017 1019  ? CHOLHDL  3 05/04/2011 1001  ? VLDL 35.0 05/04/2011 1001  ? [place] 53 08/30/2017 1019  ? ?  ? ?Wt Readings from Last 3 Encounters:  ?08/12/21 250 lb 6.4 oz (113.6 kg)  ?08/12/21 250 lb 6.4 oz (113.6 kg)  ?04/15/21 248 lb 3.2 oz (112.6 kg)  ?  ? ? ?Other studies Reviewed: ?Additional studies/ records  that were reviewed today include: . ?Review of the above records demonstrates:  ? ? ?ASSESSMENT AND PLAN: ? ?1.  Atrial fibrillation -   he is in chronic atrial fibrillation heart rate is 42 today.  We will discontinue the Toprol.  He has not had any episodes of syncope or presyncope. ? ?2. Obesity -     I encouraged him to work on weight loss. ? ? ?3. Hyperlipidemia -   his labs from his primary medical doctor look good. ? ? ?4.  Hypertension-    blood pressure is well controlled. ?  ? ?5.  COPD :     ? ? ?Current medicines are reviewed at length with the patient today.  The patient does not have concerns regarding medicines. ? ?The following changes have been made:  no change ? ?Labs/ tests ordered today include:  ? ?Orders Placed This Encounter  ?Procedures  ? EKG 12-Lead  ? ? ? ?Disposition:   FU with me or APP in 1 year  ? ? ?Signed, ?Mertie Moores, MD  ?08/12/2021 5:00 PM    ?[facility] ?[place], [place], [place]  [address] ?Phone: [phone]; Fax: [phone]  ?

## 2021-08-12 NOTE — Patient Instructions (Signed)
Description   ?Continue taking Warfarin 1/2 tablet daily except for 1 tablet on Sundays and Thursdays. Recheck INR in 5 weeks (normally 6 weeks).  [facility]IN CLINIC [phone].  ?  ?   ?

## 2021-08-12 NOTE — Patient Instructions (Signed)
If Zyere switches to Eliquis his dose would be ?Eliquis 5 mg twice a day ? ?Medication Instructions:  ?STOP Toprol XL ?*If you need a refill on your cardiac medications before your next appointment, please call your pharmacy* ? ? ?Lab Work: ?NONE ?If you have labs (blood work) drawn today and your tests are completely normal, you will receive your results only by: ?MyChart Message (if you have MyChart) OR ?A paper copy in the mail ?If you have any lab test that is abnormal or we need to change your treatment, we will call you to review the results. ? ? ?Testing/Procedures: ?NONE ? ? ?Follow-Up: ?At [facility], you and your health needs are our priority.  As part of our continuing mission to provide you with exceptional heart care, we have created designated Provider Care Teams.  These Care Teams include your primary Cardiologist (physician) and Advanced Practice Providers (APPs -  Physician Assistants and Nurse Practitioners) who all work together to provide you with the care you need, when you need it. ? ?We recommend signing up for the patient portal called "MyChart".  Sign up information is provided on this After Visit Summary.  MyChart is used to connect with patients for Virtual Visits (Telemedicine).  Patients are able to view lab/test results, encounter notes, upcoming appointments, etc.  Non-urgent messages can be sent to your provider as well.   ?To learn more about what you can do with MyChart, go to [email or web address].   ? ?Your next appointment:   ?1 year(s) ? ?The format for your next appointment:   ?In Person ? ?Provider:   ?Mertie Moores, MD  or Robbie Lis, PA-C, Christen Bame, NP, or Richardson Dopp, PA-C      ? ?Important Information About Sugar ? ? ? ? ?  ? ? ? ?

## 2021-08-15 ENCOUNTER — Other Ambulatory Visit: Payer: Self-pay | Admitting: Neurology

## 2021-08-17 ENCOUNTER — Ambulatory Visit: Payer: Medicare Other | Attending: Neurology | Admitting: Speech Pathology

## 2021-08-17 DIAGNOSIS — R41841 Cognitive communication deficit: Secondary | ICD-10-CM | POA: Diagnosis not present

## 2021-08-17 DIAGNOSIS — R4701 Aphasia: Secondary | ICD-10-CM | POA: Diagnosis not present

## 2021-08-17 NOTE — Therapy (Signed)
[facility] ?[place] ?[place], [place], [address] ?Phone: [phone]   Fax:  [phone] ? ?Speech Language Pathology Treatment ? ?Patient Details  ?Name: John Buckley ?MRN: [id number] ?Date of Birth: 16-Oct-1945 ?Referring Provider (SLP): Dr. Delice Lesch, Santiago Glad ? ? ?Encounter Date: 08/17/2021 ? ? End of Session - 08/17/21 1350   ? ? Visit Number 16   ? Number of Visits 25   ? Date for SLP Re-Evaluation 09/10/21   ? Authorization Type medicare   ? SLP Start Time 1350   ? SLP Stop Time  1435   ? SLP Time Calculation (min) 45 min   ? Activity Tolerance Patient tolerated treatment well   ? ?  ?  ? ?  ? ? ?Past Medical History:  ?Diagnosis Date  ? Atrial fibrillation ([place])   ? Hyperlipidemia   ? Hypertension   ? Long-term (current) use of anticoagulants   ? Tobacco abuse   ? ? ?Past Surgical History:  ?Procedure Laterality Date  ? CARDIAC CATHETERIZATION  01/15/2003  ? Est. EF is around 65% --  Relatively smooth coronary arteries.  He has a few minor luminal irregularities -- Normal left ventricular systolic function -- Thayer Headings, M.D.  ? CARDIOVERSION  08/04/20006  ? was successful  ? COLONOSCOPY W/ POLYPECTOMY  12/02/2003  ? A moderate-sized rectal polyp at 15 cm -- ohn C. Amedeo Plenty, M.D.  ? NOSE SURGERY    ? TONSILLECTOMY    ? ? ?There were no vitals filed for this visit. ? ? Subjective Assessment - 08/17/21 1351   ? ? Subjective "hey I like it except my dog did it" (made him fall)   ? Currently in Pain? No/denies   ? ?  ?  ? ?  ? ? ? ? ? ? ? ? ADULT SLP TREATMENT - 08/17/21 1354   ? ?  ? General Information  ? Behavior/Cognition Alert;Pleasant mood;Distractible;Requires cueing;Cooperative   ?  ? Treatment Provided  ? Treatment provided Cognitive-Linquistic   ?  ? Cognitive-Linquistic Treatment  ? Treatment focused on Aphasia;Cognition   ? Skilled Treatment Pt presents with usual anomia in conversation, routinely looking to spouse to fill in for anomia.  Max-A required to repair communication breakdowns with decreased awareness of this date. Target family names recall with errorless learning and spaced retriveal with usual mod-A for recall. Occasional benefit to providing initial letter of name. Spouse continues to demonstrate appropriate cueing to assist in word finding for pt   ?  ? Assessment / Recommendations / Plan  ? Plan Continue with current plan of care   ?  ? Progression Toward Goals  ? Progression toward goals Progressing toward goals   ? ?  ?  ? ?  ? ? ? SLP Education - 08/17/21 1440   ? ? Education Details spaced retrival   ? Person(s) Educated Patient;Spouse   ? Methods Explanation;Demonstration   ? Comprehension Verbalized understanding;Returned demonstration;Need further instruction   ? ?  ?  ? ?  ? ? ? SLP Short Term Goals - 08/17/21 1350   ? ?  ? SLP SHORT TERM GOAL #1  ? Title Care partner will demonstrate appropriate cuing techniques with occasional min A across 2 sessions   ? Baseline 06-22-21, 06-24-21   ? Status Achieved   ?  ? SLP SHORT TERM GOAL #2  ? Title Pt will self-identify episodes of anomia or dysnomia with 50% accuracy on structured speech tasks with occasional mod  A over 2 sessions   ? Baseline 07-01-21, 07-14-21   ? Status Achieved   ?  ? SLP SHORT TERM GOAL #3  ? Title pt will use mutlimodal communication to support verbal expression (gestures, writing, drawing, picture symbols) 3/5 opportunities with occasional mod A across 2 sessions   ? Baseline 06-24-21; 07-15-21   ? Status Achieved   ?  ? SLP SHORT TERM GOAL #4  ? Title pt/spouse will report implementation of 2 external aids to support orientation and memory with mod I   ? Baseline 07-14-21; 07-20-21   ? Status Achieved   ? ?  ?  ? ?  ? ? ? SLP Long Term Goals - 08/17/21 1350   ? ?  ? SLP LONG TERM GOAL #1  ? Title Care partner will report implementation of appropriate cuing techniques at home over 1 week period   ? Time 4   ? Period Weeks   ? Status On-going   ?  ? SLP LONG TERM GOAL #2   ? Title Pt will self-identify episodes of anomia or dysnomia and attempt to correct during 15 minute conversation with occasional min A over 2 sessions   ? Baseline 07-20-21   ? Time 4   ? Period Weeks   ? Status On-going   ?  ? SLP LONG TERM GOAL #3  ? Title Spouse will report pt using mutlimodal communication to support verbal expression (gestures, writing, drawing, picture symbols) in conversation at home with occasional min A over 1 week   ? Time 4   ? Period Weeks   ? Status On-going   ?  ? SLP LONG TERM GOAL #4  ? Title pt/spouse will report implementation of 4 external aids to support orientation and memory with mod I over 1 week   ? Time 4   ? Period Weeks   ? Status On-going   ? ?  ?  ? ?  ? ? ? Plan - 08/17/21 1349   ? ? 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 SLP conducted ongoing education and training of recommended cues and external aids to support patient during word finding episodes in conversation of personally relevant topics. Memory/communication support modifications and training continued this session to aid word finding and functional communication as PPA progresses. Pt has begun utilizing word finding compensations when cued (description and gestures) and utilizing communication support increasingly in therapy sessions to support communication given intermittent moderate prompting and cues. Skilled ST is recommended to address pt's cogntitive linguisitc deficits, maintain current skills, and implement compensatory strategies in advance of potential progression of disease.   ? Speech Therapy Frequency 2x / week   wife elected to decrease to 1x/week for remaining visits  ? Duration 12 weeks   ? Treatment/Interventions Compensatory strategies;Cueing hierarchy;Functional tasks;Patient/family education;Cognitive reorganization;Environmental controls;Multimodal communcation approach;SLP instruction and feedback;Internal/external aids;Compensatory  techniques;Language facilitation   ? Potential to [place]   ? Potential Considerations Medical prognosis   ? Consulted and Agree with Plan of Care Patient;Family member/caregiver   ? Family Member Consulted wife, [name]   ? ?  ?  ? ?  ? ? ?Patient will benefit from skilled therapeutic intervention in order to improve the following deficits and impairments:   ?Aphasia ? ?Cognitive communication deficit ? ? ? ?Problem List ?Patient Active Problem List  ? Diagnosis Date Noted  ? Encounter for therapeutic drug monitoring 01/10/2017  ? Obesity 11/05/2010  ? HTN (hypertension) 11/05/2010  ?  Hyperlipidemia 11/05/2010  ? Atrial fibrillation ([place]) 07/13/2010  ? ? ?Su Monks, CCC-SLP ?08/17/2021, 2:41 PM ? ?[facility] ?[place] ?[place], [place], [address] ?Phone: [phone]   Fax:  [phone] ? ? ?Name: John Buckley ?MRN: [id number] ?Date of Birth: [birth year] ? ?

## 2021-08-24 ENCOUNTER — Ambulatory Visit: Payer: Medicare Other | Admitting: Speech Pathology

## 2021-08-24 DIAGNOSIS — R41841 Cognitive communication deficit: Secondary | ICD-10-CM

## 2021-08-24 DIAGNOSIS — R4701 Aphasia: Secondary | ICD-10-CM

## 2021-08-24 NOTE — Therapy (Signed)
[facility] ?[place] ?[place], [place], [address] ?Phone: [phone]   Fax:  [phone] ? ?Speech Language Pathology Treatment ? ?Patient Details  ?Name: John Buckley ?MRN: [id number] ?Date of Birth: 1945-06-02 ?Referring Provider (SLP): Dr. Delice Lesch, Santiago Glad ? ? ?Encounter Date: 08/24/2021 ? ? End of Session - 08/24/21 1403   ? ? Visit Number 17   ? Number of Visits 25   ? Date for SLP Re-Evaluation 09/10/21   ? Authorization Type medicare   ? SLP Start Time 1403   ? SLP Stop Time  1445   ? SLP Time Calculation (min) 42 min   ? Activity Tolerance Patient tolerated treatment well   ? ?  ?  ? ?  ? ? ?Past Medical History:  ?Diagnosis Date  ? Atrial fibrillation ([place])   ? Hyperlipidemia   ? Hypertension   ? Long-term (current) use of anticoagulants   ? Tobacco abuse   ? ? ?Past Surgical History:  ?Procedure Laterality Date  ? CARDIAC CATHETERIZATION  01/15/2003  ? Est. EF is around 65% --  Relatively smooth coronary arteries.  He has a few minor luminal irregularities -- Normal left ventricular systolic function -- Thayer Headings, M.D.  ? CARDIOVERSION  08/04/20006  ? was successful  ? COLONOSCOPY W/ POLYPECTOMY  12/02/2003  ? A moderate-sized rectal polyp at 15 cm -- ohn C. Amedeo Plenty, M.D.  ? NOSE SURGERY    ? TONSILLECTOMY    ? ? ?There were no vitals filed for this visit. ? ? ? ? ? ? ? ? ? ADULT SLP TREATMENT - 08/24/21 0001   ? ?  ? General Information  ? Behavior/Cognition Alert;Pleasant mood;Distractible;Requires cueing;Cooperative   ?  ? Treatment Provided  ? Treatment provided Cognitive-Linquistic   ?  ? Cognitive-Linquistic Treatment  ? Treatment focused on Aphasia;Cognition   ? Skilled Treatment SLP led pt through conversation regarding upcoming events this weekend. Pt requires usual mod to max-A for details. Usual and persistant word finding, even covering same details repetitively. SLP provides coaching on using communication support to  compensate for word finding. Demonstrating impairment of orientation and recall of pertient memories (e.g. who daughters are). Target use of tools to aid in orientation and communication dspite cognitive dficits. Target use of DL to recall and relay home address. Requires usual max-A.   ?  ? Assessment / Recommendations / Plan  ? Plan Continue with current plan of care   ?  ? Progression Toward Goals  ? Progression toward goals Progressing toward goals   ? ?  ?  ? ?  ? ? ? SLP Education - 08/24/21 1441   ? ? Education Details spaced retrieval, word finding   ? Person(s) Educated Patient;Spouse   ? Comprehension Verbalized understanding;Returned demonstration;Need further instruction   ? ?  ?  ? ?  ? ? ? SLP Short Term Goals - 08/24/21 1407   ? ?  ? SLP SHORT TERM GOAL #1  ? Title Care partner will demonstrate appropriate cuing techniques with occasional min A across 2 sessions   ? Baseline 06-22-21, 06-24-21   ? Status Achieved   ?  ? SLP SHORT TERM GOAL #2  ? Title Pt will self-identify episodes of anomia or dysnomia with 50% accuracy on structured speech tasks with occasional mod A over 2 sessions   ? Baseline 07-01-21, 07-14-21   ? Status Achieved   ?  ? SLP SHORT TERM GOAL #3  ? Title  pt will use mutlimodal communication to support verbal expression (gestures, writing, drawing, picture symbols) 3/5 opportunities with occasional mod A across 2 sessions   ? Baseline 06-24-21; 07-15-21   ? Status Achieved   ?  ? SLP SHORT TERM GOAL #4  ? Title pt/spouse will report implementation of 2 external aids to support orientation and memory with mod I   ? Baseline 07-14-21; 07-20-21   ? Status Achieved   ? ?  ?  ? ?  ? ? ? SLP Long Term Goals - 08/24/21 1408   ? ?  ? SLP LONG TERM GOAL #1  ? Title Care partner will report implementation of appropriate cuing techniques at home over 1 week period   ? Time 3   ? Period Weeks   ? Status On-going   ?  ? SLP LONG TERM GOAL #2  ? Title Pt will self-identify episodes of anomia or dysnomia and  attempt to correct during 15 minute conversation with occasional min A over 2 sessions   ? Baseline 07-20-21   ? Time 3   ? Period Weeks   ? Status On-going   ?  ? SLP LONG TERM GOAL #3  ? Title Spouse will report pt using mutlimodal communication to support verbal expression (gestures, writing, drawing, picture symbols) in conversation at home with occasional min A over 1 week   ? Time 3   ? Period Weeks   ? Status On-going   ?  ? SLP LONG TERM GOAL #4  ? Title pt/spouse will report implementation of 4 external aids to support orientation and memory with mod I over 1 week   ? Time 3   ? Period Weeks   ? Status On-going   ? ?  ?  ? ?  ? ? ? Plan - 08/24/21 1445   ? ? Clinical Impression Statement Ronte has dx of PPA and cognitive impairment. SLP conducted ongoing education and training of recommended cues and external aids to support patient during word finding episodes in conversation of personally relevant topics. Memory/communication support modifications and training continued this session to aid word finding and functional communication as PPA progresses. Pt has begun utilizing word finding compensations when cued (description and gestures) and utilizing communication support increasingly in therapy sessions to support communication given intermittent moderate prompting and cues. Skilled ST is recommended to address pt's cogntitive linguisitc deficits, maintain current skills, and implement compensatory strategies in advance of potential progression of disease.   ? Speech Therapy Frequency 2x / week   wife elected to decrease to 1x/week for remaining visits  ? Duration 12 weeks   ? Treatment/Interventions Compensatory strategies;Cueing hierarchy;Functional tasks;Patient/family education;Cognitive reorganization;Environmental controls;Multimodal communcation approach;SLP instruction and feedback;Internal/external aids;Compensatory techniques;Language facilitation   ? Potential to [place]   ? Potential  Considerations Medical prognosis   ? Consulted and Agree with Plan of Care Patient;Family member/caregiver   ? Family Member Consulted wife, [name]   ? ?  ?  ? ?  ? ? ?Patient will benefit from skilled therapeutic intervention in order to improve the following deficits and impairments:   ?Cognitive communication deficit ? ?Aphasia ? ? ? ?Problem List ?Patient Active Problem List  ? Diagnosis Date Noted  ? Encounter for therapeutic drug monitoring 01/10/2017  ? Obesity 11/05/2010  ? HTN (hypertension) 11/05/2010  ? Hyperlipidemia 11/05/2010  ? Atrial fibrillation ([place]) 07/13/2010  ? ? ?Su Monks, CCC-SLP ?08/24/2021, 2:45 PM ? ?[facility] ?[place] ?[address] ?[place], [place], [address] ?Phone: [phone]   Fax:  [phone] ? ? ?Name: JAHSIR RAMA ?MRN: [id number] ?Date of Birth: 19-Aug-1945 ? ?

## 2021-08-31 ENCOUNTER — Encounter: Payer: Self-pay | Admitting: Cardiovascular Disease

## 2021-08-31 ENCOUNTER — Ambulatory Visit: Payer: Medicare Other

## 2021-08-31 DIAGNOSIS — R41841 Cognitive communication deficit: Secondary | ICD-10-CM

## 2021-08-31 DIAGNOSIS — R4701 Aphasia: Secondary | ICD-10-CM | POA: Diagnosis not present

## 2021-08-31 NOTE — Therapy (Signed)
[facility] ?[place] ?[place], [place], [address] ?Phone: [phone]   Fax:  [phone] ? ?Speech Language Pathology Treatment ? ?Patient Details  ?Name: John Buckley ?MRN: [id number] ?Date of Birth: 1946-02-15 ?Referring Provider (SLP): Dr. Delice Lesch, Santiago Glad ? ? ?Encounter Date: 08/31/2021 ? ? End of Session - 08/31/21 1353   ? ? Visit Number 18   ? Number of Visits 25   ? Date for SLP Re-Evaluation 09/10/21   ? Authorization Type medicare   ? SLP Start Time 1400   ? SLP Stop Time  1445   ? SLP Time Calculation (min) 45 min   ? Activity Tolerance Patient tolerated treatment well   ? ?  ?  ? ?  ? ? ?Past Medical History:  ?Diagnosis Date  ? Atrial fibrillation ([place])   ? Hyperlipidemia   ? Hypertension   ? Long-term (current) use of anticoagulants   ? Tobacco abuse   ? ? ?Past Surgical History:  ?Procedure Laterality Date  ? CARDIAC CATHETERIZATION  01/15/2003  ? Est. EF is around 65% --  Relatively smooth coronary arteries.  He has a few minor luminal irregularities -- Normal left ventricular systolic function -- Thayer Headings, M.D.  ? CARDIOVERSION  08/04/20006  ? was successful  ? COLONOSCOPY W/ POLYPECTOMY  12/02/2003  ? A moderate-sized rectal polyp at 15 cm -- ohn C. Amedeo Plenty, M.D.  ? NOSE SURGERY    ? TONSILLECTOMY    ? ? ?There were no vitals filed for this visit. ? ? Subjective Assessment - 08/31/21 1357   ? ? Subjective "pretty good except for a few times"   ? Patient is accompained by: Family member   ? Currently in Pain? No/denies   ? ?  ?  ? ?  ? ? ? ? ? ? ? ? ADULT SLP TREATMENT - 08/31/21 1353   ? ?  ? General Information  ? Behavior/Cognition Alert;Pleasant mood;Distractible;Requires cueing;Cooperative   ?  ? Treatment Provided  ? Treatment provided Cognitive-Linquistic   ?  ? Cognitive-Linquistic Treatment  ? Treatment focused on Aphasia;Cognition   ? Skilled Treatment Pt's wife reported speech is "much better" re: functional  communication. Pt continues to use communication support with good acccuracy given additional time and occasional prompting. Occasional mod to max cues required to aid anomia in conversation, with increased use of description strategy reported at home to provide context for listeners.   ?  ? Assessment / Recommendations / Plan  ? Plan Continue with current plan of care   ?  ? Progression Toward Goals  ? Progression toward goals Progressing toward goals   ? ?  ?  ? ?  ? ? ? SLP Education - 08/31/21 1619   ? ? Education Details communication support, may want to return for ST intervention as PPA progresses   ? Person(s) Educated Patient;Spouse   ? Methods Explanation;Demonstration;Handout   ? Comprehension Verbalized understanding;Returned demonstration;Need further instruction   ? ?  ?  ? ?  ? ? ? SLP Short Term Goals - 08/24/21 1407   ? ?  ? SLP SHORT TERM GOAL #1  ? Title Care partner will demonstrate appropriate cuing techniques with occasional min A across 2 sessions   ? Baseline 06-22-21, 06-24-21   ? Status Achieved   ?  ? SLP SHORT TERM GOAL #2  ? Title Pt will self-identify episodes of anomia or dysnomia with 50% accuracy on structured speech tasks with occasional mod  A over 2 sessions   ? Baseline 07-01-21, 07-14-21   ? Status Achieved   ?  ? SLP SHORT TERM GOAL #3  ? Title pt will use mutlimodal communication to support verbal expression (gestures, writing, drawing, picture symbols) 3/5 opportunities with occasional mod A across 2 sessions   ? Baseline 06-24-21; 07-15-21   ? Status Achieved   ?  ? SLP SHORT TERM GOAL #4  ? Title pt/spouse will report implementation of 2 external aids to support orientation and memory with mod I   ? Baseline 07-14-21; 07-20-21   ? Status Achieved   ? ?  ?  ? ?  ? ? ? SLP Long Term Goals - 08/31/21 1354   ? ?  ? SLP LONG TERM GOAL #1  ? Title Care partner will report implementation of appropriate cuing techniques at home over 1 week period   ? Time 2   ? Period Weeks   ? Status On-going    ?  ? SLP LONG TERM GOAL #2  ? Title Pt will self-identify episodes of anomia or dysnomia and attempt to correct during 15 minute conversation with occasional min A over 2 sessions   ? Baseline 07-20-21   ? Time 2   ? Period Weeks   ? Status On-going   ?  ? SLP LONG TERM GOAL #3  ? Title Spouse will report pt using mutlimodal communication to support verbal expression (gestures, writing, drawing, picture symbols) in conversation at home with occasional min A over 1 week   ? Baseline 08-31-21   ? Time 2   ? Period Weeks   ? Status On-going   ?  ? SLP LONG TERM GOAL #4  ? Title pt/spouse will report implementation of 4 external aids to support orientation and memory with mod I over 1 week   ? Time 2   ? Period Weeks   ? Status On-going   ? ?  ?  ? ?  ? ? ? Plan - 08/31/21 1354   ? ? 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 SLP conducted ongoing education and training of recommended cues and external aids to support patient during word finding episodes in conversation of personally relevant topics. Memory/communication support modifications and training continued this session to aid word finding and functional communication as PPA progresses. Pt has begun utilizing word finding compensations when cued (description and gestures) and utilizing communication support increasingly in therapy sessions and at home to support communication given intermittent moderate prompting and cues. Skilled ST is recommended to address pt's cogntitive linguisitc deficits, maintain current skills, and implement compensatory strategies in advance of potential progression of disease.   ? Speech Therapy Frequency 2x / week   wife elected to decrease to 1x/week for remaining visits  ? Duration 12 weeks   ? Treatment/Interventions Compensatory strategies;Cueing hierarchy;Functional tasks;Patient/family education;Cognitive reorganization;Environmental controls;Multimodal communcation approach;SLP instruction and  feedback;Internal/external aids;Compensatory techniques;Language facilitation   ? Potential to [place]   ? Potential Considerations Medical prognosis   ? Consulted and Agree with Plan of Care Patient;Family member/caregiver   ? Family Member Consulted wife, John Buckley   ? ?  ?  ? ?  ? ? ?Patient will benefit from skilled therapeutic intervention in order to improve the following deficits and impairments:   ?Aphasia ? ?Cognitive communication deficit ? ? ? ?Problem List ?Patient Active Problem List  ? Diagnosis Date Noted  ? Encounter for therapeutic drug monitoring 01/10/2017  ?  Obesity 11/05/2010  ? HTN (hypertension) 11/05/2010  ? Hyperlipidemia 11/05/2010  ? Atrial fibrillation ([place]) 07/13/2010  ? ? ?Marzetta Board, CCC-SLP ?08/31/2021, 4:21 PM ? ?[facility] ?[place] ?[place], [place], [address] ?Phone: [phone]   Fax:  [phone] ? ? ?Name: John Buckley ?MRN: [id number] ?Date of Birth: Feb 04, 1946 ? ?

## 2021-09-01 DIAGNOSIS — L84 Corns and callosities: Secondary | ICD-10-CM | POA: Diagnosis not present

## 2021-09-01 DIAGNOSIS — B351 Tinea unguium: Secondary | ICD-10-CM | POA: Diagnosis not present

## 2021-09-01 DIAGNOSIS — L603 Nail dystrophy: Secondary | ICD-10-CM | POA: Diagnosis not present

## 2021-09-01 DIAGNOSIS — M792 Neuralgia and neuritis, unspecified: Secondary | ICD-10-CM | POA: Diagnosis not present

## 2021-09-01 DIAGNOSIS — I739 Peripheral vascular disease, unspecified: Secondary | ICD-10-CM | POA: Diagnosis not present

## 2021-09-02 NOTE — Telephone Encounter (Signed)
Called and spoke with pt's wife. She stated they forgot he needed to have his INR checked prior to switching to Eliquis. I educated her on the purpose of checking INR prior to switching to Eliquis and the need for INR to be less than 2. She verbalized understanding. Pt has scheduled anticoagulation appt on 09/15/21 and will be given information about switching to Eliquis at this appt.

## 2021-09-07 ENCOUNTER — Ambulatory Visit: Payer: Medicare Other | Admitting: Speech Pathology

## 2021-09-07 DIAGNOSIS — R41841 Cognitive communication deficit: Secondary | ICD-10-CM | POA: Diagnosis not present

## 2021-09-07 DIAGNOSIS — R4701 Aphasia: Secondary | ICD-10-CM

## 2021-09-07 NOTE — Therapy (Signed)
[facility] [address] [place], [place], [address] Phone: [phone]   Fax:  [phone]  Speech Language Pathology Treatment & Discharge  Patient Details  Name: John Buckley MRN: [id number] Date of Birth: 06/11/1945 Referring Provider (SLP): Dr. Ellouise Newer   Encounter Date: 09/07/2021   End of Session - 09/07/21 1354     Visit Number 19    Number of Visits 25    Date for SLP Re-Evaluation 09/10/21    Authorization Type medicare    SLP Start Time 1400    SLP Stop Time  7408    SLP Time Calculation (min) 38 min    Activity Tolerance Patient tolerated treatment well             Past Medical History:  Diagnosis Date   Atrial fibrillation ([place])    Hyperlipidemia    Hypertension    Long-term (current) use of anticoagulants    Tobacco abuse     Past Surgical History:  Procedure Laterality Date   CARDIAC CATHETERIZATION  01/15/2003   Est. EF is around 65% --  Relatively smooth coronary arteries.  He has a few minor luminal irregularities -- Normal left ventricular systolic function -- Thayer Headings, M.D.   CARDIOVERSION  08/04/20006   was successful   COLONOSCOPY W/ POLYPECTOMY  12/02/2003   A moderate-sized rectal polyp at 15 cm -- ohn C. Amedeo Plenty, M.D.   NOSE SURGERY     TONSILLECTOMY      There were no vitals filed for this visit.   Subjective Assessment - 09/07/21 1357     Subjective "things are pretty good"    Patient is accompained by: Family member    Currently in Pain? No/denies            SPEECH THERAPY DISCHARGE SUMMARY  Visits from Start of Care: 19  Current functional level related to goals / functional outcomes: Patient and spouse endorse improved functional communication, notable to variety of communication partners. Pt is using developed communication support with occasional verbal-A and descriptions to repair communication breakdowns 2/2 anomia. Spouse trained in and  effectively using appropriate cues to support patient's verbal expression.    Remaining deficits: Pt with progressive disease, thus continues to present with usual anomia and communication breakdowns. However, increased communication success despite continued anomia.    Education / Equipment: Chiropractor, communication support, memory/orientation book, anomia strategies  Patient agrees to discharge. Patient goals were partially met. Patient is being discharged due to being pleased with the current functional level..       ADULT SLP TREATMENT - 09/07/21 0001       General Information   Behavior/Cognition Alert;Pleasant mood;Distractible;Requires cueing;Cooperative      Treatment Provided   Treatment provided Cognitive-Linquistic      Cognitive-Linquistic Treatment   Treatment focused on Aphasia;Cognition    Skilled Treatment Reviewed use of communication supports. Spouse reports implementation of novel communication support to recall family member's names. In conversation, able to recall x2 family member's names IND, x2 with use of communication support. Usual anomia in conversation, more effectively compensating with describing. Care partner continues to use appropriate cueing. Discussion on activities patient can do to stay socially connected. Generated ideas spouse will look into.      Assessment / Recommendations / Plan   Plan Discharge SLP treatment due to (comment)   being pleased with current functional status.     Progression Toward Goals   Progression toward goals  Goals partially met, education completed, patient discharged from [place] - 09/07/21 1354       [place] #1   Title Care partner will demonstrate appropriate cuing techniques with occasional min A across 2 sessions    Baseline 06-22-21, 06-24-21    Status Achieved      SLP SHORT TERM GOAL #2   Title Pt will self-identify episodes of anomia  or dysnomia with 50% accuracy on structured speech tasks with occasional mod A over 2 sessions    Baseline 07-01-21, 07-14-21    Status Achieved      SLP SHORT TERM GOAL #3   Title pt will use mutlimodal communication to support verbal expression (gestures, writing, drawing, picture symbols) 3/5 opportunities with occasional mod A across 2 sessions    Baseline 06-24-21; 07-15-21    Status Achieved      SLP SHORT TERM GOAL #4   Title pt/spouse will report implementation of 2 external aids to support orientation and memory with mod I    Baseline 07-14-21; 07-20-21    Status Achieved              SLP Long Term Goals - 09/07/21 1355       SLP LONG TERM GOAL #1   Title Care partner will report implementation of appropriate cuing techniques at home over 1 week period    Baseline 09/07/2021    Status Achieved      SLP LONG TERM GOAL #2   Title Pt will self-identify episodes of anomia or dysnomia and attempt to correct during 15 minute conversation with occasional min A over 2 sessions    Baseline 07-20-21, 09/07/21    Status Partially Met      SLP LONG TERM GOAL #3   Title Spouse will report pt using mutlimodal communication to support verbal expression (gestures, writing, drawing, picture symbols) in conversation at home with occasional min A over 1 week    Baseline 08-31-21, 09-07-21    Status Achieved      SLP LONG TERM GOAL #4   Title pt/spouse will report implementation of 4 external aids to support orientation and memory with mod I over 1 week    Baseline 09-07-21    Status Partially Met              Plan - 09/07/21 1356     Clinical Impression Statement John Buckley has dx of PPA and cognitive impairment. SLP conducted ongoing education and training of recommended cues and external aids to support patient during word finding episodes in conversation of personally relevant topics. Memory/communication support modifications and training continued this session to aid word finding and  functional communication as PPA progresses. Pt has begun utilizing word finding compensations when cued (description and gestures) and utilizing communication support increasingly in therapy sessions and at home to support communication given intermittent moderate prompting and cues. Pt and spouse report improved functional communication abilities. D/c this date d/t pt satisfaction with functional level.    Speech Therapy Frequency 2x / week   wife elected to decrease to 1x/week for remaining visits   Duration 12 weeks    Treatment/Interventions Compensatory strategies;Cueing hierarchy;Functional tasks;Patient/family education;Cognitive reorganization;Environmental controls;Multimodal communcation approach;SLP instruction and feedback;Internal/external aids;Compensatory techniques;Language facilitation    Potential to Achieve Goals [place]    Potential Considerations Medical prognosis    Consulted and Agree with Plan of Care Patient;Family  member/caregiver    Family Member Consulted wife, John Buckley             Patient will benefit from skilled therapeutic intervention in order to improve the following deficits and impairments:   Aphasia  Cognitive communication deficit    Problem List Patient Active Problem List   Diagnosis Date Noted   Encounter for therapeutic drug monitoring 01/10/2017   Obesity 11/05/2010   HTN (hypertension) 11/05/2010   Hyperlipidemia 11/05/2010   Atrial fibrillation ([place]) 07/13/2010    Su Monks, [place] 09/07/2021, 2:20 PM  [facility] [address] [place], [place], [address] Phone: [phone]   Fax:  [phone]   Name: John Buckley MRN: [id number] Date of Birth: December 30, 1945

## 2021-09-15 ENCOUNTER — Ambulatory Visit (INDEPENDENT_AMBULATORY_CARE_PROVIDER_SITE_OTHER): Payer: Medicare Other

## 2021-09-15 ENCOUNTER — Other Ambulatory Visit: Payer: Self-pay

## 2021-09-15 DIAGNOSIS — G3101 Pick's disease: Secondary | ICD-10-CM | POA: Diagnosis not present

## 2021-09-15 DIAGNOSIS — Z5181 Encounter for therapeutic drug level monitoring: Secondary | ICD-10-CM

## 2021-09-15 DIAGNOSIS — I4891 Unspecified atrial fibrillation: Secondary | ICD-10-CM | POA: Diagnosis not present

## 2021-09-15 DIAGNOSIS — I4819 Other persistent atrial fibrillation: Secondary | ICD-10-CM

## 2021-09-15 DIAGNOSIS — I10 Essential (primary) hypertension: Secondary | ICD-10-CM | POA: Diagnosis not present

## 2021-09-15 DIAGNOSIS — E7800 Pure hypercholesterolemia, unspecified: Secondary | ICD-10-CM | POA: Diagnosis not present

## 2021-09-15 LAB — POCT INR: INR: 2.2 (ref 2.0–3.0)

## 2021-09-15 MED ORDER — APIXABAN 5 MG PO TABS: 5.0000 mg | ORAL_TABLET | Freq: Two times a day (BID) | ORAL | 2 refills | Status: DC

## 2021-09-15 NOTE — Progress Notes (Signed)
Prescription refill request for Eliquis received. Indication: Afib  Last office visit: 08/12/21 Scr: 0.7 (12/28/20)  Age: 76 Weight: 113.6kg  Appropriate dose and refill sent to requested pharmacy.   Message sent to Dr Acie Fredrickson and Donnalee Curry, RN to make aware of medication change.

## 2021-09-15 NOTE — Telephone Encounter (Signed)
Switched to [employer]. Warfarin discontinued.

## 2021-09-15 NOTE — Addendum Note (Signed)
Addended by: Leonidas Romberg on: 09/15/2021 02:08 PM   Modules accepted: Orders

## 2021-09-15 NOTE — Patient Instructions (Signed)
Description   Hold tomorrow's dose and begin taking Eliquis '5mg'$  twice daily on Friday, 09/17/21.  [facility] [phone].

## 2021-09-15 NOTE — Telephone Encounter (Signed)
Receive message from Dr Acie Fredrickson, pt should have BMP & CBC drawn in 1 month. Orders placed and pt made aware.

## 2021-10-13 ENCOUNTER — Encounter: Payer: Self-pay | Admitting: Physician Assistant

## 2021-10-13 ENCOUNTER — Ambulatory Visit (INDEPENDENT_AMBULATORY_CARE_PROVIDER_SITE_OTHER): Payer: Medicare Other | Admitting: Physician Assistant

## 2021-10-13 VITALS — HR 74 | Resp 18 | Ht 74.0 in

## 2021-10-13 DIAGNOSIS — G301 Alzheimer's disease with late onset: Secondary | ICD-10-CM | POA: Diagnosis not present

## 2021-10-13 DIAGNOSIS — F0280 Dementia in other diseases classified elsewhere without behavioral disturbance: Secondary | ICD-10-CM | POA: Diagnosis not present

## 2021-10-13 DIAGNOSIS — G309 Alzheimer's disease, unspecified: Secondary | ICD-10-CM

## 2021-10-13 DIAGNOSIS — G3101 Pick's disease: Secondary | ICD-10-CM

## 2021-10-13 DIAGNOSIS — F03A0 Unspecified dementia, mild, without behavioral disturbance, psychotic disturbance, mood disturbance, and anxiety: Secondary | ICD-10-CM

## 2021-10-13 DIAGNOSIS — F02A0 Dementia in other diseases classified elsewhere, mild, without behavioral disturbance, psychotic disturbance, mood disturbance, and anxiety: Secondary | ICD-10-CM | POA: Diagnosis not present

## 2021-10-13 MED ORDER — DONEPEZIL HCL 10 MG PO TABS: ORAL_TABLET | ORAL | 4 refills | Status: DC

## 2021-10-13 MED ORDER — MEMANTINE HCL 10 MG PO TABS
ORAL_TABLET | ORAL | 0 refills | Status: DC
Start: 2021-10-13 — End: 2022-08-22

## 2021-10-13 MED ORDER — ESCITALOPRAM OXALATE 20 MG PO TABS: 20.0000 mg | ORAL_TABLET | Freq: Every day | ORAL | 3 refills | Status: DC

## 2021-10-13 NOTE — Patient Instructions (Addendum)
It was a pleasure to see you today at our office.   Recommendations:  Follow up in 6  months Continue donepezil 10 mg daily. Side effects were discussed  Continue Memantine 10 mg twice daily.Side effects were discussed  Continue Lexapro 20 mg daily   Whom to call:  Memory  decline, memory medications: Call our office [phone]   For psychiatric meds, mood meds: Please have your primary care physician manage these medications.   Counseling regarding caregiver distress, including caregiver depression, anxiety and issues regarding community resources, adult day care programs, adult living facilities, or memory care questions:   Feel free to contact Misty Taylor Palladino, Social Worker at [phone]   For assessment of decision of mental capacity and competency:  Call Dr. Michelle Haber, geriatric psychiatrist at [phone]  For guidance in geriatric dementia issues please call [facility] [phone]  For guidance regarding [facility] and if placement were needed at the facility, contact Nicole Reynolds, Social Worker tel: [phone]  If you have any severe symptoms of a stroke, or other severe issues such as confusion,severe chills or fever, etc call 911 or go to the ER as you may need to be evaluated further   Feel free to visit Facebook page " [email or web address]" for tips of how to care for people with memory problems.         RECOMMENDATIONS FOR ALL PATIENTS WITH MEMORY PROBLEMS: 1. Continue to exercise (Recommend 30 minutes of walking everyday, or 3 hours every week) 2. Increase social interactions - continue going to Church and enjoy social gatherings with friends and family 3. Eat healthy, avoid fried foods and eat more fruits and vegetables 4. Maintain adequate blood pressure, blood sugar, and blood cholesterol level. Reducing the risk of stroke and cardiovascular disease also helps promoting better memory. 5. Avoid stressful situations. Live a  simple life and avoid aggravations. Organize your time and prepare for the next day in anticipation. 6. Sleep well, avoid any interruptions of sleep and avoid any distractions in the bedroom that may interfere with adequate sleep quality 7. Avoid sugar, avoid sweets as there is a strong link between excessive sugar intake, diabetes, and cognitive impairment We discussed the Mediterranean diet, which has been shown to help patients reduce the risk of progressive memory disorders and reduces cardiovascular risk. This includes eating fish, eat fruits and green leafy vegetables, nuts like almonds and hazelnuts, walnuts, and also use olive oil. Avoid fast foods and fried foods as much as possible. Avoid sweets and sugar as sugar use has been linked to worsening of memory function.  There is always a concern of gradual progression of memory problems. If this is the case, then we may need to adjust level of care according to patient needs. Support, both to the patient and caregiver, should then be put into place.    FALL PRECAUTIONS: Be cautious when walking. Scan the area for obstacles that may increase the risk of trips and falls. When getting up in the mornings, sit up at the edge of the bed for a few minutes before getting out of bed. Consider elevating the bed at the head end to avoid drop of blood pressure when getting up. Walk always in a well-lit room (use night lights in the walls). Avoid area rugs or power cords from appliances in the middle of the walkways. Use a walker or a cane if necessary and consider physical therapy for balance exercise. Get your eyesight checked regularly.  FINANCIAL   OVERSIGHT: Supervision, especially oversight when making financial decisions or transactions is also recommended.  HOME SAFETY: Consider the safety of the kitchen when operating appliances like stoves, microwave oven, and blender. Consider having supervision and share cooking responsibilities until no longer able  to participate in those. Accidents with firearms and other hazards in the house should be identified and addressed as well.   ABILITY TO BE LEFT ALONE: If patient is unable to contact 911 operator, consider using LifeLine, or when the need is there, arrange for someone to stay with patients. Smoking is a fire hazard, consider supervision or cessation. Risk of wandering should be assessed by caregiver and if detected at any point, supervision and safe proof recommendations should be instituted.  MEDICATION SUPERVISION: Inability to self-administer medication needs to be constantly addressed. Implement a mechanism to ensure safe administration of the medications.   DRIVING: Regarding driving, in patients with progressive memory problems, driving will be impaired. We advise to have someone else do the driving if trouble finding directions or if minor accidents are reported. Independent driving assessment is available to determine safety of driving.   If you are interested in the driving assessment, you can contact the following:  [employer] in [place] [phone]  [facility] [phone]  [facility] [phone]  [facility] [phone] or [phone]   

## 2021-10-13 NOTE — Progress Notes (Signed)
Assessment/Plan:   Dementia likely due to Alzheimer's disease Primary progressive aphasia. This is a pleasant 76 yo RH man with a history of hypertension, hyperlipidemia, atrial fibrillation on AC, tobacco use, with mild dementia. 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  More aphasia may be noted today.  He completed speech therapy, he is not interested in pursuing any more ST at this time.  Last MMSE 10/30 (26/30 in 2019). He is on donepezil 10 mg daily and memantine 10 mg twice daily.  He is on Lexapro 10 mg daily and wife would like to have  this Lexapro 10 mg increased, which it may be reasonable for mood control.    Continue donepezil 10 mg daily, memantine 10 mg twice daily side effects were discussed Increase Lexapro to 20 mg daily for mood control. Wife reports that she is experiencing significant stress at home, due to unexpected circumstances, and is concerned about the patient's wellbeing, and is entertaining home health nursing versus ALF.  She wishes to talk to Levonne Lapping, our social worker, for caregiver distress, and to look for resources for 24/7 care. Follow up in 6 months.   Case discussed with Dr. Delice Lesch who agrees with the plan    Subjective:    John Buckley is a very pleasant 76 y.o. RH male  seen today in follow up for memory loss. This patient is accompanied in the office by his wife and daughter who supplements the history.  Previous records as well as any outside records available were reviewed prior to todays visit.  Patient was last seen at our office on 06/04/2021, at which time his MMSE was 10/30.  Patient is currently on donepezil 10 mg daily and memantine 10 mg twice daily.  She is also on Lexapro 10 mg daily for mood control  Any changes in memory since last visit?  He is wife reports that his  short-term memory is worse, his long-term memory is good.  He does not do any activities other than mowing the grass.  He favors staying in the house all day. Patient lives with: Spouse Disoriented when walking into a room?  Patient denies   Leaving objects in unusual places?  Patient denies   Ambulates  with difficulty?   Patient denies   Recent falls?  Patient denies   Any head injuries?  Patient denies   History of seizures?   Patient denies   Wandering behavior?  Patient denies   Patient drives?   Patient no longer drives.  He continues to argue with the family regarding this issue.  His keys has been taken away for safety.   Any mood changes?  "He is angry because he is unable to drive ".  Any history of depression?:  Patient denies   Hallucinations?  Patient denies   Paranoia?  Patient denies   Patient reports that he sleeps well without vivid dreams, REM behavior or sleepwalking    History of sleep apnea?  Patient denies   Any hygiene concerns?  Patient denies   Independent of bathing and dressing?  Endorsed  Does the patient needs help with medications?  Wife monitors.   Who is in charge of the finances?  Wife is in charge. Any changes in appetite?  Craving a lot more sweets, wants to snack more    Patient have trouble swallowing? Patient denies   Does the patient  cook?  Patient denies   Any kitchen accidents such as leaving the stove on? Patient denies   Any headaches?  Patient denies   The double vision? Patient denies   Any focal numbness or tingling?  Patient denies   Chronic back pain Patient denies   Unilateral weakness?  Patient denies   Any tremors?  Patient denies   Any history of anosmia?  Patient denies   Any incontinence of urine?  Patient denies   Any bowel dysfunction?  "12-second wife he will have a blowout ".  History on Initial Assessment 08/28/2017: This is a pleasant 76 year old right-handed man with a history of hypertension, hyperlipidemia, atrial  fibrillation, tobacco use, presenting for evaluation of worsening memory. He feels his memory is "not worth a hoot." He and his wife started noticing changes a little over a year ago. He feels that it seemed to start all of a sudden, he would be having a conversation and could not think of a word in the middle of his sentence. It takes a minute or two to come back. He is misplacing things frequently at home. He continues to drive without getting lost. Most of the bills are on draft, he denies missing bills. He has an alarm for his medications and rarely misses them. His wife first noticed changes when they were writing Christmas cards together, he was having trouble spelling addresses copying them from their address book. This year they decided to use labels. He has more difficulty concentrating and following programs on TV as easily as he used to. They were driving in [place] last August, which they do every year, but she noticed a change in behavior which was just out of character. They are quite familiar with the city but he became really anxious bout where to turn and what to do. He reports his wife was navigating with her phone and they were using a rental car, which stressed him out. Otherwise at home, she has not noticed similar significant anxiety. He has always had a quick temper and is a little more irritable/cranky now. No paranoia or hallucinations. He is able to bathe and dress independently.    He has brief unsteadiness when he lays on his right side and gets up, with associated nausea. He denies any headaches, diplopia, dysarthria/dysphagia, neck/back pain, focal numbness/tingling/weakness, bladder dysfunction, tremor. He has frequent bowel movements. His sense of smell has been affected by his smoking. His mother and maternal aunt had dementia. He denies any history of significant head injuries. He drinks alcohol occasionally.    Laboratory Data: 11/2016 TSH and B12 normal at PCP office. MRI  brain without contrast done 08/2017 which did not show any acute changes. There was moderate diffuse atrophy, mild chronic microvascular disease. 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   PREVIOUS MEDICATIONS:   CURRENT MEDICATIONS:  Outpatient Encounter Medications as of 10/13/2021  Medication Sig   amLODipine (NORVASC) 5 MG tablet Take 5 mg by mouth daily.   apixaban (ELIQUIS) 5 MG TABS tablet Take 1 tablet (5 mg total) by mouth 2 (two) times daily.   atorvastatin (LIPITOR) 40 MG tablet Take 1 tablet (40 mg total) by mouth daily.   BETAMETHASONE PO Take by mouth as needed.   Cholecalciferol (VITAMIN D3) 2000 UNITS capsule Take 2,000 Units by mouth daily.   donepezil (ARICEPT) 10 MG tablet Take 1 tablet daily  escitalopram (LEXAPRO) 10 MG tablet Take 1 tablet (10 mg total) by mouth daily.   loperamide (IMODIUM A-D) 2 MG tablet 1 tablet as needed   losartan (COZAAR) 100 MG tablet Take 100 mg by mouth daily.   memantine (NAMENDA) 10 MG tablet TAKE 1 TABLET TWICE DAILY   multivitamin (THERAGRAN) per tablet Take 1 tablet by mouth daily. Takes two per week   No facility-administered encounter medications on file as of 10/13/2021.       04/15/2021   11:00 AM 03/20/2018   12:00 PM  MMSE - Mini Mental State Exam  Orientation to time 0 4  Orientation to Place 0 5  Registration 3 3  Attention/ Calculation 2 3  Recall 0 2  Language- name 2 objects 0 2  Language- repeat 1 1  Language- follow 3 step command 2 3  Language- read & follow direction 1 1  Write a sentence 0 1  Copy design 1 1  Total score 10 26      06/24/2019    2:00 PM 10/26/2018   12:00 PM 08/28/2017   10:00 AM  Montreal Cognitive Assessment   Visuospatial/ Executive (0/5) '3 4 4  '$ Naming (0/3) '1 3 3  '$ Attention: Read list of digits (0/2) '2 1 2  '$ Attention: Read list of letters (0/1)  '1 1 1  '$ Attention: Serial 7 subtraction starting at 100 (0/3) '1 1 3  '$ Language: Repeat phrase (0/2) '1 1 2  '$ Language : Fluency (0/1) 0 0 0  Abstraction (0/2) 1 0 2  Delayed Recall (0/5) 0 0 2  Orientation (0/6) '4 6 6  '$ Total '14 17 25  '$ Adjusted Score (based on education) 14      Objective:     PHYSICAL EXAMINATION:    VITALS:   Vitals:   10/13/21 1438  Pulse: 74  Resp: 18  SpO2: 98%  Height: '6\' 2"'$  (1.88 m)    GEN:  The patient appears stated age and is in NAD. HEENT:  Normocephalic, atraumatic.    General: NAD, well-groomed, appears stated age. Orientation: The patient is alert. Oriented to person, unable to the month or year, city or state, he becomes frustrated.  He is unable to name objects, describing that instead.  He is able to repeat.   Cranial nerves: There is good facial symmetry.The speech is f not fluent but clear.  More aphasia is noted to in today's visit.  No dysarthria.  Fund of knowledge is reduced.Recent and remote memory are impaired. Attention and concentration are reduced. Unable to name objects and repeat phrases.  Hearing is intact to conversational tone.    Sensation: Sensation is intact to light touch throughout Motor: Strength is at least antigravity x4. Tremors: none  DTR's 2/4 in UE/LE     Movement examination: Tone: There is normal tone in the UE/LE Abnormal movements:  no tremor.  No myoclonus.  No asterixis.   Coordination:  There is no decremation with RAM's. Normal finger to nose  Gait and Station: The patient has no difficulty arising out of a deep-seated chair without the use of the hands. The patient's stride length is good.  Gait is cautious and narrow.    Thank you for allowing [place] the opportunity to participate in the care of this nice patient. Please do not hesitate to contact us for any questions or concerns.   Total time spent on today's visit was 40 minutes dedicated to this patient today, preparing to see patient, examining the  patient,  ordering tests and/or medications and counseling the patient, documenting clinical information in the EHR or other health record, independently interpreting results and communicating results to the patient/family, discussing treatment and goals, answering patient's questions and coordinating care.  Cc:  Lavone Orn, MD  Sharene Butters 10/13/2021 2:41 PM

## 2021-10-14 ENCOUNTER — Telehealth: Payer: Self-pay | Admitting: Physician Assistant

## 2021-10-14 NOTE — Telephone Encounter (Signed)
Patient's wife called and said she has questions about the medication changes from yesterday. She said the AVS and MyChart only have limited notes and she has questions.

## 2021-10-14 NOTE — Telephone Encounter (Signed)
Called wife back and let her know Sara's recommendations

## 2021-10-14 NOTE — Telephone Encounter (Signed)
Patients wife would like to know why patient went straight to 20 mg of Lexapro and didn't wean up to it doing a 15 mg first

## 2021-10-15 ENCOUNTER — Other Ambulatory Visit: Payer: Medicare Other

## 2021-10-15 DIAGNOSIS — I4819 Other persistent atrial fibrillation: Secondary | ICD-10-CM | POA: Diagnosis not present

## 2021-10-16 LAB — CBC
Hematocrit: 44.6 % (ref 37.5–51.0)
Hemoglobin: 14.4 g/dL (ref 13.0–17.7)
MCH: 29.8 pg (ref 26.6–33.0)
MCHC: 32.3 g/dL (ref 31.5–35.7)
MCV: 92 fL (ref 79–97)
Platelets: 220 10*3/uL (ref 150–450)
RBC: 4.83 x10E6/uL (ref 4.14–5.80)
RDW: 13.1 % (ref 11.6–15.4)
WBC: 7.5 10*3/uL (ref 3.4–10.8)

## 2021-10-16 LAB — BASIC METABOLIC PANEL
BUN/Creatinine Ratio: 19 (ref 10–24)
BUN: 17 mg/dL (ref 8–27)
CO2: 27 mmol/L (ref 20–29)
Calcium: 9.2 mg/dL (ref 8.6–10.2)
Chloride: 104 mmol/L (ref 96–106)
Creatinine, Ser: 0.91 mg/dL (ref 0.76–1.27)
Glucose: 88 mg/dL (ref 70–99)
Potassium: 4.1 mmol/L (ref 3.5–5.2)
Sodium: 142 mmol/L (ref 134–144)
eGFR: 87 mL/min/{1.73_m2} (ref >59)

## 2021-11-29 ENCOUNTER — Other Ambulatory Visit: Payer: Self-pay | Admitting: Cardiovascular Disease

## 2021-11-29 DIAGNOSIS — Z5181 Encounter for therapeutic drug level monitoring: Secondary | ICD-10-CM

## 2021-11-29 DIAGNOSIS — I4891 Unspecified atrial fibrillation: Secondary | ICD-10-CM

## 2021-11-29 NOTE — Telephone Encounter (Signed)
Prescription refill request for Eliquis received. Indication: Afib  Last office visit: 08/12/21 ([facility])  Scr: 0.91 (10/15/21) Age: 76 Weight: 113.6kg  Appropriate dose and refill sent to requested pharmacy.

## 2021-12-01 DIAGNOSIS — L603 Nail dystrophy: Secondary | ICD-10-CM | POA: Diagnosis not present

## 2021-12-01 DIAGNOSIS — L84 Corns and callosities: Secondary | ICD-10-CM | POA: Diagnosis not present

## 2021-12-01 DIAGNOSIS — M792 Neuralgia and neuritis, unspecified: Secondary | ICD-10-CM | POA: Diagnosis not present

## 2021-12-01 DIAGNOSIS — I739 Peripheral vascular disease, unspecified: Secondary | ICD-10-CM | POA: Diagnosis not present

## 2021-12-01 DIAGNOSIS — B351 Tinea unguium: Secondary | ICD-10-CM | POA: Diagnosis not present

## 2021-12-06 ENCOUNTER — Other Ambulatory Visit: Payer: Self-pay | Admitting: *Deleted

## 2021-12-06 DIAGNOSIS — I4891 Unspecified atrial fibrillation: Secondary | ICD-10-CM

## 2021-12-06 DIAGNOSIS — Z5181 Encounter for therapeutic drug level monitoring: Secondary | ICD-10-CM

## 2021-12-06 MED ORDER — APIXABAN 5 MG PO TABS: 5.0000 mg | ORAL_TABLET | Freq: Two times a day (BID) | ORAL | 1 refills | Status: DC

## 2021-12-06 NOTE — Telephone Encounter (Signed)
Eliquis '5mg'$  refill request received from [facility]. Patient is 76 years old, weight-113.6kg, Crea-0.91 on 10/15/2021, Diagnosis-Afib, and last seen by Dr. Acie Fredrickson on 08/12/2021. Dose is appropriate based on dosing criteria. Will send in refill to requested pharmacy.

## 2021-12-15 DIAGNOSIS — Z0279 Encounter for issue of other medical certificate: Secondary | ICD-10-CM

## 2021-12-20 ENCOUNTER — Encounter: Payer: Self-pay | Admitting: Physician Assistant

## 2021-12-21 NOTE — Telephone Encounter (Signed)
No problem, he can take it in the morning. Thanks

## 2022-01-04 DIAGNOSIS — I10 Essential (primary) hypertension: Secondary | ICD-10-CM | POA: Diagnosis not present

## 2022-01-04 DIAGNOSIS — I482 Chronic atrial fibrillation, unspecified: Secondary | ICD-10-CM | POA: Diagnosis not present

## 2022-01-04 DIAGNOSIS — F0390 Unspecified dementia without behavioral disturbance: Secondary | ICD-10-CM | POA: Diagnosis not present

## 2022-01-04 DIAGNOSIS — G3101 Pick's disease: Secondary | ICD-10-CM | POA: Diagnosis not present

## 2022-01-04 DIAGNOSIS — Z0279 Encounter for issue of other medical certificate: Secondary | ICD-10-CM

## 2022-02-05 DIAGNOSIS — Z23 Encounter for immunization: Secondary | ICD-10-CM | POA: Diagnosis not present

## 2022-03-08 DIAGNOSIS — L821 Other seborrheic keratosis: Secondary | ICD-10-CM | POA: Diagnosis not present

## 2022-03-08 DIAGNOSIS — Z85828 Personal history of other malignant neoplasm of skin: Secondary | ICD-10-CM | POA: Diagnosis not present

## 2022-03-08 DIAGNOSIS — L814 Other melanin hyperpigmentation: Secondary | ICD-10-CM | POA: Diagnosis not present

## 2022-03-08 DIAGNOSIS — Z08 Encounter for follow-up examination after completed treatment for malignant neoplasm: Secondary | ICD-10-CM | POA: Diagnosis not present

## 2022-03-08 DIAGNOSIS — D225 Melanocytic nevi of trunk: Secondary | ICD-10-CM | POA: Diagnosis not present

## 2022-03-09 DIAGNOSIS — I739 Peripheral vascular disease, unspecified: Secondary | ICD-10-CM | POA: Diagnosis not present

## 2022-03-09 DIAGNOSIS — B351 Tinea unguium: Secondary | ICD-10-CM | POA: Diagnosis not present

## 2022-03-09 DIAGNOSIS — L603 Nail dystrophy: Secondary | ICD-10-CM | POA: Diagnosis not present

## 2022-03-09 DIAGNOSIS — L84 Corns and callosities: Secondary | ICD-10-CM | POA: Diagnosis not present

## 2022-03-09 DIAGNOSIS — M792 Neuralgia and neuritis, unspecified: Secondary | ICD-10-CM | POA: Diagnosis not present

## 2022-04-14 ENCOUNTER — Ambulatory Visit (INDEPENDENT_AMBULATORY_CARE_PROVIDER_SITE_OTHER): Payer: Medicare Other | Admitting: Physician Assistant

## 2022-04-14 ENCOUNTER — Encounter: Payer: Self-pay | Admitting: Physician Assistant

## 2022-04-14 VITALS — BP 129/76 | HR 58 | Resp 18 | Ht 73.0 in | Wt 240.0 lb

## 2022-04-14 DIAGNOSIS — F0280 Dementia in other diseases classified elsewhere without behavioral disturbance: Secondary | ICD-10-CM

## 2022-04-14 DIAGNOSIS — G3101 Pick's disease: Secondary | ICD-10-CM | POA: Diagnosis not present

## 2022-04-14 DIAGNOSIS — G301 Alzheimer's disease with late onset: Secondary | ICD-10-CM

## 2022-04-14 DIAGNOSIS — F02A0 Dementia in other diseases classified elsewhere, mild, without behavioral disturbance, psychotic disturbance, mood disturbance, and anxiety: Secondary | ICD-10-CM | POA: Diagnosis not present

## 2022-04-14 DIAGNOSIS — R413 Other amnesia: Secondary | ICD-10-CM | POA: Diagnosis not present

## 2022-04-14 NOTE — Progress Notes (Signed)
Assessment/Plan:   Memory Impairment likely due to Alzheimer disease Primary progressive aphasia  John Buckley is a very pleasant 76 y.o. RH male with a history of hypertension, hyperlipidemia, atrial fibrillation on AC, tobacco use, and a history of neurodegenerative syndrome resemblance semantic variant of PPA, which can be caused by Alzheimer's disease as per neuropsychological evaluation March 2021. He is seen today in follow up for memory loss. Patient is currently on memantine 10 mg twice daily and donepezil 10 mg daily. He completed speech therapy in the past, but did not likely to continue practicing the techniques afterworlds and not interested in  pursuing any more sessions.  Patient is on Lexapro 20 mg daily for mood control, tolerating well.    Follow up in 6  months. Continue Memantine 10 mg twice daily and donepezil 10 mg daily. Side effects were discussed  Recommend good control of cardiovascular risk factors.   Continue to control mood with Lexapro 20 mg daily   Subjective:    This patient is accompanied in the office by his daughters  who supplements the history.  Previous records as well as any outside records available were reviewed prior to todays visit. Patient was last seen on 10/13/2021.   Any changes in memory since last visit?  His short-term memory is about the same, but his LTM maybe a" little bit affected as well". He does not do any activities other than blowing off the deck with the blower and go to the park to walk the dogs". Awaiting for the  Memory Care facility, he is on a wait list. For now, daughters share responsibility of caring  for him after the death of his wife last summer. He is to oint [facility] soon.  He prefers to stay in the house all day. ". When asking his daughters if he likes to do word finding or other brain games, they laughed, "He was never a game person" repeats oneself?  Endorsed "that is pretty  regular"-daughter says.  Disoriented when walking into a room?  Denies  Leaving objects in unusual places?  Denies   Wandering behavior?  denies   Any personality changes since last visit?  denies   Any worsening depression?:  Wife died in 21-Jan-2023 of terminal pancreatic cancer, initially having a very hard time, but he is adjusting to these.  They were married 44 years.  He is on Lexapro, which seems to help. Hallucinations or paranoia?  denies   Seizures?  denies    Any sleep changes?  Denies vivid dreams, REM behavior or sleepwalking   Sleep apnea?   denies   Any hygiene concerns? Denies.  Independent of bathing and dressing?  Endorsed  Does the patient needs help with medications? Daughters is in charge  Who is in charge of the finances?  Daughter is in charge    Any changes in appetite?  "I like to eat " Patient have trouble swallowing?  denies   Does the patient cook? No     Any headaches?   denies   Chronic back pain  denies   Ambulates with difficulty? denies   Recent falls or head injuries? denies     Unilateral weakness, numbness or tingling? denies   Any tremors?  denies   Any anosmia? " He never had a good sense of smell " -daughters say Any incontinence of urine?  denies   Any bowel dysfunction?  denies      Patient lives in  his house, one of her daughters stays all week and the other daughter during the weekend "he is never alone " Does the patient drive? His keys have been removed.  No longer drives Vision changes: "He will have an appt to get his eyes checked soon"   PREVIOUS MEDICATIONS:   CURRENT MEDICATIONS:  Outpatient Encounter Medications as of 04/14/2022  Medication Sig   amLODipine (NORVASC) 5 MG tablet Take 5 mg by mouth daily.   apixaban (ELIQUIS) 5 MG TABS tablet Take 1 tablet (5 mg total) by mouth 2 (two) times daily.   atorvastatin (LIPITOR) 40 MG tablet Take 1 tablet (40 mg total) by mouth daily.   BETAMETHASONE PO Take by mouth as needed.    Cholecalciferol (VITAMIN D3) 2000 UNITS capsule Take 2,000 Units by mouth daily.   donepezil (ARICEPT) 10 MG tablet Take 1 tablet daily   escitalopram (LEXAPRO) 20 MG tablet Take 1 tablet (20 mg total) by mouth daily.   loperamide (IMODIUM A-D) 2 MG tablet 1 tablet as needed   losartan (COZAAR) 100 MG tablet Take 100 mg by mouth daily.   memantine (NAMENDA) 10 MG tablet TAKE 1 TABLET TWICE DAILY   multivitamin (THERAGRAN) per tablet Take 1 tablet by mouth daily. Takes two per week   No facility-administered encounter medications on file as of 04/14/2022.       04/15/2021   11:00 AM 03/20/2018   12:00 PM  MMSE - Mini Mental State Exam  Orientation to time 0 4  Orientation to Place 0 5  Registration 3 3  Attention/ Calculation 2 3  Recall 0 2  Language- name 2 objects 0 2  Language- repeat 1 1  Language- follow 3 step command 2 3  Language- read & follow direction 1 1  Write a sentence 0 1  Copy design 1 1  Total score 10 26      06/24/2019    2:00 PM 10/26/2018   12:00 PM 08/28/2017   10:00 AM  Montreal Cognitive Assessment   Visuospatial/ Executive (0/5) '3 4 4  '$ Naming (0/3) '1 3 3  '$ Attention: Read list of digits (0/2) '2 1 2  '$ Attention: Read list of letters (0/1) '1 1 1  '$ Attention: Serial 7 subtraction starting at 100 (0/3) '1 1 3  '$ Language: Repeat phrase (0/2) '1 1 2  '$ Language : Fluency (0/1) 0 0 0  Abstraction (0/2) 1 0 2  Delayed Recall (0/5) 0 0 2  Orientation (0/6) '4 6 6  '$ Total '14 17 25  '$ Adjusted Score (based on education) 14      Objective:     PHYSICAL EXAMINATION:    VITALS:   Vitals:   04/14/22 1420  BP: 129/76  Pulse: (!) 58  Resp: 18  SpO2: 97%  Weight: 240 lb (108.9 kg)  Height: '6\' 1"'$  (1.854 m)    GEN:  The patient appears stated age and is in NAD. HEENT:  Normocephalic, atraumatic.   Neurological examination:  General: NAD, well-groomed, appears stated age. Orientation: The patient is alert. Oriented to person, not to place or date. He cannot  tell what year it is.  Cranial nerves: There is good facial symmetry.The speech is  not and clear. Aphasia is noted. No dysarthria. Fund of knowledge is reduced. Recent and remote memory are impaired. Attention and concentration are reduced. Unable to name objects and has difficulty repeating phrases.  Hearing is intact to conversational tone.    Sensation: Sensation is intact to light touch throughout Motor:  Strength is at least antigravity x4. DTR's 2/4 in UE/LE     Movement examination: Tone: There is normal tone in the UE/LE Abnormal movements:  no tremor.  No myoclonus.  No asterixis.   Coordination:  There is no decremation with RAM's. Abnormal finger to nose L>R Gait and Station: The patient has no difficulty arising out of a deep-seated chair without the use of the hands. The patient's stride length is good.  Gait is cautious and narrow.    Thank you for allowing [place] the opportunity to participate in the care of this nice patient. Please do not hesitate to contact us for any questions or concerns.   Total time spent on today's visit was 33 minutes dedicated to this patient today, preparing to see patient, examining the patient, ordering tests and/or medications and counseling the patient, documenting clinical information in the EHR or other health record, independently interpreting results and communicating results to the patient/family, discussing treatment and goals, answering patient's questions and coordinating care.  Cc:  Lavone Orn, MD  Sharene Butters 04/14/2022 2:49 PM

## 2022-04-14 NOTE — Patient Instructions (Signed)
It was a pleasure to see you today at our office.   Recommendations:  Follow up in 6  months Continue donepezil 10 mg daily. Side effects were discussed  Continue Memantine 10 mg twice daily.Side effects were discussed  Continue Lexapro 20 mg daily   Whom to call:  Memory  decline, memory medications: Call our office [phone]   For psychiatric meds, mood meds: Please have your primary care physician manage these medications.   Counseling regarding caregiver distress, including caregiver depression, anxiety and issues regarding community resources, adult day care programs, adult living facilities, or memory care questions:   Feel free to contact [place], Social Worker at [phone]   For assessment of decision of mental capacity and competency:  Call Dr. Anthoney Harada, geriatric psychiatrist at [phone]  For guidance in geriatric dementia issues please call [facility] [phone]  For guidance regarding [facility] and if placement were needed at the facility, contact Arnell Asal, Social Worker tel: [phone]  If you have any severe symptoms of a stroke, or other severe issues such as confusion,severe chills or fever, etc call 911 or go to the ER as you may need to be evaluated further   Feel free to visit Facebook page " [email or web address]" for tips of how to care for people with memory problems.         RECOMMENDATIONS FOR ALL PATIENTS WITH MEMORY PROBLEMS: 1. Continue to exercise (Recommend 30 minutes of walking everyday, or 3 hours every week) 2. Increase social interactions - continue going to [employer] and enjoy social gatherings with friends and family 3. Eat healthy, avoid fried foods and eat more fruits and vegetables 4. Maintain adequate blood pressure, blood sugar, and blood cholesterol level. Reducing the risk of stroke and cardiovascular disease also helps promoting better memory. 5. Avoid stressful situations. Live a  simple life and avoid aggravations. Organize your time and prepare for the next day in anticipation. 6. Sleep well, avoid any interruptions of sleep and avoid any distractions in the bedroom that may interfere with adequate sleep quality 7. Avoid sugar, avoid sweets as there is a strong link between excessive sugar intake, diabetes, and cognitive impairment We discussed the Mediterranean diet, which has been shown to help patients reduce the risk of progressive memory disorders and reduces cardiovascular risk. This includes eating fish, eat fruits and green leafy vegetables, nuts like almonds and hazelnuts, walnuts, and also use olive oil. Avoid fast foods and fried foods as much as possible. Avoid sweets and sugar as sugar use has been linked to worsening of memory function.  There is always a concern of gradual progression of memory problems. If this is the case, then we may need to adjust level of care according to patient needs. Support, both to the patient and caregiver, should then be put into place.    FALL PRECAUTIONS: Be cautious when walking. Scan the area for obstacles that may increase the risk of trips and falls. When getting up in the mornings, sit up at the edge of the bed for a few minutes before getting out of bed. Consider elevating the bed at the head end to avoid drop of blood pressure when getting up. Walk always in a well-lit room (use night lights in the walls). Avoid area rugs or power cords from appliances in the middle of the walkways. Use a walker or a cane if necessary and consider physical therapy for balance exercise. Get your eyesight checked regularly.  FINANCIAL  OVERSIGHT: Supervision, especially oversight when making financial decisions or transactions is also recommended.  HOME SAFETY: Consider the safety of the kitchen when operating appliances like stoves, microwave oven, and blender. Consider having supervision and share cooking responsibilities until no longer able  to participate in those. Accidents with firearms and other hazards in the house should be identified and addressed as well.   ABILITY TO BE LEFT ALONE: If patient is unable to contact 911 operator, consider using Life[facility], or when the need is there, arrange for someone to stay with patients. Smoking is a fire hazard, consider supervision or cessation. Risk of wandering should be assessed by caregiver and if detected at any point, supervision and safe proof recommendations should be instituted.  MEDICATION SUPERVISION: Inability to self-administer medication needs to be constantly addressed. Implement a mechanism to ensure safe administration of the medications.   DRIVING: Regarding driving, in patients with progressive memory problems, driving will be impaired. We advise to have someone else do the driving if trouble finding directions or if minor accidents are reported. Independent driving assessment is available to determine safety of driving.   If you are interested in the driving assessment, you can contact the following:  [employer] in [place]  [place] [phone]  [place]  [facility] [phone] or [phone]

## 2022-04-19 ENCOUNTER — Ambulatory Visit: Payer: Medicare Other | Admitting: Neurology

## 2022-06-15 ENCOUNTER — Encounter: Payer: Self-pay | Admitting: Physician Assistant

## 2022-06-21 ENCOUNTER — Telehealth: Payer: Self-pay | Admitting: Cardiovascular Disease

## 2022-06-21 DIAGNOSIS — Z5181 Encounter for therapeutic drug level monitoring: Secondary | ICD-10-CM

## 2022-06-21 DIAGNOSIS — I4891 Unspecified atrial fibrillation: Secondary | ICD-10-CM

## 2022-06-21 MED ORDER — APIXABAN 5 MG PO TABS: 5.0000 mg | ORAL_TABLET | Freq: Two times a day (BID) | ORAL | 3 refills | Status: AC

## 2022-06-21 NOTE — Telephone Encounter (Signed)
Fax attempted three times at number provided during triage call. Fax fails each time. Called facility back and spoke with "Nicole Kindred" who answered call and provided different fax number: [phone]. Attempted three separate fax's, but all failed stating "no answer." Called facility back, spoke with Nicole Kindred who transferred to [facility], but call went to voicemail. Left message for her asking that she call back to clarify correct fax number and/or if we can provide verbal order. Rx has never been,and should not be, prescribed for once daily dosing.

## 2022-06-21 NOTE — Telephone Encounter (Signed)
Pt c/o medication issue:  1. Name of Medication:   apixaban (ELIQUIS) 5 MG TABS tablet    2. How are you currently taking this medication (dosage and times per day)?   3. Are you having a reaction (difficulty breathing--STAT)?   4. What is your medication issue?  [facility] director at pt's facility called in asking for clarification on pt instructions for this medication.

## 2022-06-21 NOTE — Telephone Encounter (Signed)
Charity from [employer] called and stated that patient has moved into the facility on the memory care unit. They received form signed by PCP with all patient's medication and its states to give eliquis '5mg'$  1 tab daily. So they have been only given him one tablet a day. Today she spoke with family and they stated he was getting eliquis twice a day at home. The Rx directions in epic states '5mg'$  twice daily. I informed her of the instructions. She stated she needed a signed order from provider so they can start giving him the eliquis twice a day.Her fax number is [phone]

## 2022-06-21 NOTE — Telephone Encounter (Signed)
Returned call to daughter Lattie Haw on DPR who states that she will email [facility] and ask her to reach out to [place] or help accommodate getting the new prescription to them. She states she often has difficulty getting in contact with her as well.

## 2022-06-21 NOTE — Telephone Encounter (Signed)
Per Charity at [facility] (patient resides in memory care unit), they need a prescription/signed order for patient's Eliquis to be faxed to them. Rx printed and faxed at this time.

## 2022-06-22 NOTE — Telephone Encounter (Signed)
Call back to say they got the PCP office to fix it. Please advise

## 2022-06-22 NOTE — Telephone Encounter (Signed)
Left message for [facility] regarding phone note. If no further needs do not need callback, provided office number for callback if issue still needs to be resolved regarding Eliquis Rx.

## 2022-08-20 ENCOUNTER — Encounter (HOSPITAL_COMMUNITY): Payer: Self-pay

## 2022-08-20 ENCOUNTER — Other Ambulatory Visit: Payer: Self-pay

## 2022-08-20 ENCOUNTER — Emergency Department (HOSPITAL_COMMUNITY): Payer: Medicare Other

## 2022-08-20 ENCOUNTER — Emergency Department (HOSPITAL_COMMUNITY)
Admission: EM | Admit: 2022-08-20 | Discharge: 2022-08-22 | Disposition: A | Discharge status: Skilled Nursing Facility | Payer: Medicare Other | Attending: Emergency Medicine | Admitting: Emergency Medicine

## 2022-08-20 DIAGNOSIS — Z87891 Personal history of nicotine dependence: Secondary | ICD-10-CM | POA: Insufficient documentation

## 2022-08-20 DIAGNOSIS — F03918 Unspecified dementia, unspecified severity, with other behavioral disturbance: Secondary | ICD-10-CM | POA: Diagnosis present

## 2022-08-20 DIAGNOSIS — F02818 Dementia in other diseases classified elsewhere, unspecified severity, with other behavioral disturbance: Secondary | ICD-10-CM | POA: Insufficient documentation

## 2022-08-20 DIAGNOSIS — R4689 Other symptoms and signs involving appearance and behavior: Secondary | ICD-10-CM | POA: Diagnosis present

## 2022-08-20 DIAGNOSIS — E876 Hypokalemia: Secondary | ICD-10-CM | POA: Diagnosis not present

## 2022-08-20 DIAGNOSIS — Z7901 Long term (current) use of anticoagulants: Secondary | ICD-10-CM | POA: Insufficient documentation

## 2022-08-20 DIAGNOSIS — I10 Essential (primary) hypertension: Secondary | ICD-10-CM | POA: Diagnosis not present

## 2022-08-20 DIAGNOSIS — F03911 Unspecified dementia, unspecified severity, with agitation: Secondary | ICD-10-CM

## 2022-08-20 DIAGNOSIS — Z79899 Other long term (current) drug therapy: Secondary | ICD-10-CM | POA: Insufficient documentation

## 2022-08-20 DIAGNOSIS — G309 Alzheimer's disease, unspecified: Secondary | ICD-10-CM | POA: Diagnosis not present

## 2022-08-20 LAB — CBC WITH DIFFERENTIAL/PLATELET
Abs Immature Granulocytes: 0.01 10*3/uL (ref 0.00–0.07)
Basophils Absolute: 0 10*3/uL (ref 0.0–0.1)
Basophils Relative: 0 %
Eosinophils Absolute: 0 10*3/uL (ref 0.0–0.5)
Eosinophils Relative: 0 %
HCT: 40.2 % (ref 39.0–52.0)
Hemoglobin: 13.4 g/dL (ref 13.0–17.0)
Immature Granulocytes: 0 %
Lymphocytes Relative: 26 %
Lymphs Abs: 1.9 10*3/uL (ref 0.7–4.0)
MCH: 30.6 pg (ref 26.0–34.0)
MCHC: 33.3 g/dL (ref 30.0–36.0)
MCV: 91.8 fL (ref 80.0–100.0)
Monocytes Absolute: 0.6 10*3/uL (ref 0.1–1.0)
Monocytes Relative: 8 %
Neutro Abs: 4.7 10*3/uL (ref 1.7–7.7)
Neutrophils Relative %: 66 %
Platelets: 172 10*3/uL (ref 150–400)
RBC: 4.38 MIL/uL (ref 4.22–5.81)
RDW: 13.8 % (ref 11.5–15.5)
WBC: 7.2 10*3/uL (ref 4.0–10.5)
nRBC: 0 % (ref 0.0–0.2)

## 2022-08-20 LAB — COMPREHENSIVE METABOLIC PANEL
ALT: 26 U/L (ref 0–44)
AST: 30 U/L (ref 15–41)
Albumin: 4 g/dL (ref 3.5–5.0)
Alkaline Phosphatase: 73 U/L (ref 38–126)
Anion gap: 10 (ref 5–15)
BUN: 14 mg/dL (ref 8–23)
CO2: 25 mmol/L (ref 22–32)
Calcium: 8.7 mg/dL — ABNORMAL LOW (ref 8.9–10.3)
Chloride: 103 mmol/L (ref 98–111)
Creatinine, Ser: 0.75 mg/dL (ref 0.61–1.24)
GFR, Estimated: >60 mL/min (ref >60)
Glucose, Bld: 93 mg/dL (ref 70–99)
Potassium: 3.3 mmol/L — ABNORMAL LOW (ref 3.5–5.1)
Sodium: 138 mmol/L (ref 135–145)
Total Bilirubin: 1.3 mg/dL — ABNORMAL HIGH (ref 0.3–1.2)
Total Protein: 6.7 g/dL (ref 6.5–8.1)

## 2022-08-20 LAB — VALPROIC ACID LEVEL: Valproic Acid Lvl: 11 ug/mL — ABNORMAL LOW (ref 50.0–100.0)

## 2022-08-20 LAB — ETHANOL: Alcohol, Ethyl (B): <10 mg/dL (ref <10)

## 2022-08-20 MED ORDER — DONEPEZIL HCL 5 MG PO TABS
10.0000 mg | ORAL_TABLET | Freq: Every day | ORAL | Status: DC
  Administered 2022-08-21 (×2): 10 mg via ORAL
  Filled 2022-08-20 (×2): qty 2

## 2022-08-20 MED ORDER — LORAZEPAM 0.5 MG PO TABS: 0.5000 mg | ORAL_TABLET | Freq: Three times a day (TID) | ORAL | Status: DC | PRN

## 2022-08-20 MED ORDER — ATORVASTATIN CALCIUM 40 MG PO TABS
40.0000 mg | ORAL_TABLET | Freq: Every day | ORAL | Status: DC
  Administered 2022-08-21 – 2022-08-22 (×2): 40 mg via ORAL
  Filled 2022-08-20 (×2): qty 1

## 2022-08-20 MED ORDER — ADULT MULTIVITAMIN W/MINERALS CH
1.0000 | ORAL_TABLET | Freq: Every day | ORAL | Status: DC
  Administered 2022-08-21 – 2022-08-22 (×2): 1 via ORAL
  Filled 2022-08-20 (×2): qty 1

## 2022-08-20 MED ORDER — POTASSIUM CHLORIDE CRYS ER 20 MEQ PO TBCR
20.0000 meq | EXTENDED_RELEASE_TABLET | Freq: Once | ORAL | Status: AC
  Administered 2022-08-20: 20 meq via ORAL
  Filled 2022-08-20: qty 1

## 2022-08-20 MED ORDER — LOPERAMIDE HCL 2 MG PO CAPS: 2.0000 mg | ORAL_CAPSULE | ORAL | Status: DC | PRN

## 2022-08-20 MED ORDER — VITAMIN D 25 MCG (1000 UNIT) PO TABS
2000.0000 [IU] | ORAL_TABLET | Freq: Every day | ORAL | Status: DC
  Administered 2022-08-21 – 2022-08-22 (×2): 2000 [IU] via ORAL
  Filled 2022-08-20 (×2): qty 2

## 2022-08-20 MED ORDER — DIVALPROEX SODIUM 125 MG PO CSDR
125.0000 mg | DELAYED_RELEASE_CAPSULE | Freq: Three times a day (TID) | ORAL | Status: DC
  Administered 2022-08-21: 125 mg via ORAL
  Filled 2022-08-20: qty 1

## 2022-08-20 MED ORDER — APIXABAN 5 MG PO TABS
5.0000 mg | ORAL_TABLET | Freq: Two times a day (BID) | ORAL | Status: DC
  Administered 2022-08-21 – 2022-08-22 (×4): 5 mg via ORAL
  Filled 2022-08-20 (×5): qty 1

## 2022-08-20 MED ORDER — LOSARTAN POTASSIUM 50 MG PO TABS
100.0000 mg | ORAL_TABLET | Freq: Every day | ORAL | Status: DC
  Administered 2022-08-21 – 2022-08-22 (×2): 100 mg via ORAL
  Filled 2022-08-20 (×2): qty 2

## 2022-08-20 MED ORDER — ACETAMINOPHEN 325 MG PO TABS
650.0000 mg | ORAL_TABLET | ORAL | Status: DC | PRN
  Administered 2022-08-21: 650 mg via ORAL
  Filled 2022-08-20: qty 2

## 2022-08-20 MED ORDER — SERTRALINE HCL 50 MG PO TABS: 50.0000 mg | ORAL_TABLET | Freq: Every day | ORAL | Status: DC

## 2022-08-20 MED ORDER — AMLODIPINE BESYLATE 5 MG PO TABS
5.0000 mg | ORAL_TABLET | Freq: Every day | ORAL | Status: DC
  Administered 2022-08-21 – 2022-08-22 (×2): 5 mg via ORAL
  Filled 2022-08-20 (×2): qty 1

## 2022-08-20 MED ORDER — LACTATED RINGERS IV BOLUS
500.0000 mL | Freq: Once | INTRAVENOUS | Status: AC
  Administered 2022-08-20: 500 mL via INTRAVENOUS

## 2022-08-20 MED ORDER — MEMANTINE HCL 5 MG PO TABS
10.0000 mg | ORAL_TABLET | Freq: Two times a day (BID) | ORAL | Status: DC
  Administered 2022-08-21 – 2022-08-22 (×4): 10 mg via ORAL
  Filled 2022-08-20 (×4): qty 2

## 2022-08-20 NOTE — ED Triage Notes (Signed)
EMS  reports- Patient comes from [facility] facility.  As per staff patient was combative to staff. They would like patient to have psych eval.  Ems was not able to get vitals. However, EMS stated patient was calm.  Hx of  Alzheimer

## 2022-08-20 NOTE — ED Notes (Signed)
Pt transported to CT ?

## 2022-08-20 NOTE — ED Notes (Signed)
Pt dressed out in scrubs, One pt belongings bag placed in cabinet above 5-8 assignment. One shirt, pants, shoes, socks, 3 spoons, and a fork

## 2022-08-20 NOTE — ED Notes (Signed)
Pt filled urinal with water instead of providing urine sample

## 2022-08-20 NOTE — ED Notes (Signed)
Pt removed him self from monitors and began prancing in the hallways
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
## 2022-08-20 NOTE — ED Notes (Signed)
IV was placed by Hyman Bower

## 2022-08-21 ENCOUNTER — Encounter (HOSPITAL_COMMUNITY): Payer: Self-pay | Admitting: Psychiatry

## 2022-08-21 DIAGNOSIS — R4689 Other symptoms and signs involving appearance and behavior: Secondary | ICD-10-CM | POA: Insufficient documentation

## 2022-08-21 DIAGNOSIS — F03918 Unspecified dementia, unspecified severity, with other behavioral disturbance: Secondary | ICD-10-CM | POA: Diagnosis present

## 2022-08-21 MED ORDER — QUETIAPINE FUMARATE 25 MG PO TABS
25.0000 mg | ORAL_TABLET | Freq: Three times a day (TID) | ORAL | Status: DC | PRN
  Administered 2022-08-21 (×2): 25 mg via ORAL
  Filled 2022-08-21 (×2): qty 1

## 2022-08-21 MED ORDER — ZIPRASIDONE MESYLATE 20 MG IM SOLR
INTRAMUSCULAR | Status: AC
  Administered 2022-08-21: 10 mg via INTRAMUSCULAR
  Filled 2022-08-21: qty 20

## 2022-08-21 MED ORDER — ZIPRASIDONE MESYLATE 20 MG IM SOLR: 10.0000 mg | Freq: Once | INTRAMUSCULAR | Status: AC

## 2022-08-21 MED ORDER — DIVALPROEX SODIUM 125 MG PO CSDR
250.0000 mg | DELAYED_RELEASE_CAPSULE | Freq: Every day | ORAL | Status: DC
  Administered 2022-08-21: 250 mg via ORAL
  Filled 2022-08-21: qty 2

## 2022-08-21 MED ORDER — STERILE WATER FOR INJECTION IJ SOLN
INTRAMUSCULAR | Status: AC
  Filled 2022-08-21: qty 10

## 2022-08-21 MED ORDER — ESCITALOPRAM OXALATE 10 MG PO TABS
10.0000 mg | ORAL_TABLET | Freq: Every day | ORAL | Status: DC
  Administered 2022-08-22: 10 mg via ORAL
  Filled 2022-08-21: qty 1

## 2022-08-21 MED ORDER — DIVALPROEX SODIUM 125 MG PO CSDR
125.0000 mg | DELAYED_RELEASE_CAPSULE | Freq: Two times a day (BID) | ORAL | Status: DC
  Administered 2022-08-21 – 2022-08-22 (×2): 125 mg via ORAL
  Filled 2022-08-21 (×2): qty 1

## 2022-08-21 NOTE — ED Notes (Signed)
Pt is currently sleeping after several episode of coming out to the nurse station having rambling conversations with staff.

## 2022-08-21 NOTE — ED Notes (Signed)
Writer feed pt his meal tray. Pt ate 100% .  Pt needs several cues and reminders.

## 2022-08-21 NOTE — Consult Note (Signed)
[facility] ED ASSESSMENT   Reason for Consult:  Psych Consult Referring Physician:  Dr. Criss Alvine  Patient Identification: [facility] MRN:  [id number] ED Chief Complaint: Dementia with behavioral disturbance [facility])  Diagnosis:  Principal Problem:   Dementia with behavioral disturbance [facility])   ED Assessment Time Calculation: Start Time: 1000 Stop Time: 1040 Total Time in Minutes (Assessment Completion): 40   HPI: Per Triage Note: EMS reports-Patient comes from [facility] facility.  As per staff patient was combative to staff. They would like patient to have psych eval. Ems was not able to get vitals. However, EMS stated patient was calm.    Subjective: [facility], 77 y.o., male patient seen face to face by this provider, consulted with Dr. Viviano Simas; and chart reviewed on 08/21/22.  On approach patient is laying in hospital bed, with wrist restraints on. Per RN note @ 0745 pt exited his room and insisted staff help him locate something. Pt instructed to walk back into room. Pt increased agitation. Pt began to yell out, verbally attacking staff. Pt then balled fist and swung directly towards security officer's face. During evaluation [facility] is laying in hospital bed in no acute distress. He is alert, oriented to self only, calm and cooperative. His mood is euthymic with congruent affect. He has normal speech, and appropriate behavior. Thought process is disorganized. Unable to assess thought content, as patient is so disorganized when speaking.  Unable to assess the patient having any SI/HI/AVH, as he is very disorganized and incoherent also unable to assess how his appetite and sleep were from patient will consult with staff.  Spoke with patient's daughter John [facility], she stated that patient's wife, mother, passed away in 09-27-23she and her sister are patient's caregivers, patient has been living at [facility] since the end of January 2024.  Patient has a long history of  dementia.  She states that patient has never been physically or verbally aggressive towards her and her sister, up until now.  She stated in the beginning of patient's stay at [facility], he enjoys being there due to the interaction with staff and socialization with peers, 2 months ago patient started becoming very aggressive in the evening time, mainly with staff members, she stated patient was threatening, and pinning staff up against the wall.  Ms. Cresenciano Genre stated about 2 months ago, the nursing home decided to give patient medication for his mood Depakote sprinkles, she is unsure of the dosage and at times he would receive it, stated that it did not work a month later they added Ativan as needed.  She stated on yesterday she visited patient and he was fixated on a male peer who was in a wheelchair, daughter stated that if she had not intervene patient was getting ready to physically hit the lady, also stated during a dance last week her sister visited and patient was fixated on the same lady in a wheelchair and he asked her if she would like to do and she said no, stated that patient was going to physically attack her until his daughter and staff stepped in to stop him.  Ms. Cresenciano Genre states that she and her sister like this nursing facility for patient, is in agreement with provider on adjusting his medication.  Currently the patient is able to return to [facility].    Past Psychiatric History: Dementia with aggressive behavior.  Risk to Self or Others: Is the patient at risk to self? Yes Has the  patient been a risk to self in the past 6 months? Yes Has the patient been a risk to self within the distant past? No Is the patient a risk to others? Yes Has the patient been a risk to others in the past 6 months? Yes Has the patient been a risk to others within the distant past? No  [place] Scale:  Flowsheet Row ED from 08/20/2022 in [facility] Emergency Department at [facility]  C-SSRS  RISK CATEGORY No Risk       AIMS:  , , ,  ,   ASAM: ASAM Multidimensional Assessment Summary DImension 1:  Acute Intoxication and/or Withdrawal Potential Severity Rating: None Dimension 2:  Biomedical Conditions and Complications Severity Rating: None Dimension 3:  Emotional, behavioral or cognitive (EBC) conditions and complications severity rating: None Dimension 4:  Readiness to Change Severity Rating: None Dimension 5:  Relapse, continued use, or continued problem potential severity rating: None Dimension 6:  Recovery/living environment severity rating: None ASAM Recommended Level of Treatment:  (n/a)  Substance Abuse:  Alcohol / Drug Use Pain Medications: See MAR Prescriptions: See MAR Over the Counter: See MAR History of alcohol / drug use?: No history of alcohol / drug abuse Longest period of sobriety (when/how long): n/a Negative Consequences of Use:  (n/a) Withdrawal Symptoms:  (n/a)  Past Medical History:  Past Medical History:  Diagnosis Date   Atrial fibrillation (HCC)    Hyperlipidemia    Hypertension    Long-term (current) use of anticoagulants    Tobacco abuse     Past Surgical History:  Procedure Laterality Date   CARDIAC CATHETERIZATION  01/15/2003   Est. EF is around 65% --  Relatively smooth coronary arteries.  He has a few minor luminal irregularities -- Normal left ventricular systolic function -- Vesta Mixer, M.D.   CARDIOVERSION  08/04/20006   was successful   COLONOSCOPY W/ POLYPECTOMY  12/02/2003   A moderate-sized rectal polyp at 15 cm -- [name], M.D.   NOSE SURGERY     TONSILLECTOMY     Family History:  Family History  Problem Relation Age of Onset   Alzheimer's disease Mother    Colon cancer Other        family history of     Social History:  Social History   Substance and Sexual Activity  Alcohol Use Yes   Comment: once in a while      Social History   Substance and Sexual Activity  Drug Use No    Social History    Socioeconomic History   Marital status: Married    Spouse name: Not on file   Number of children: 2   Years of education: 14   Highest education level: Not on file  Occupational History   Occupation: retired  Tobacco Use   Smoking status: Former    Packs/day: 0.50    Years: 39.00    Additional pack years: 0.00    Total pack years: 19.50    Types: Cigarettes   Smokeless tobacco: Never  Vaping Use   Vaping Use: Never used  Substance and Sexual Activity   Alcohol use: Yes    Comment: once in a while    Drug use: No   Sexual activity: Not on file  Other Topics Concern   Not on file  Social History Narrative   Lives with wife in two story home      Takes frequent walks with dog  Right handed      Highest level of edu- certificate program   Social Determinants of Health   Financial Resource Strain: Not on file  Food Insecurity: Not on file  Transportation Needs: Not on file  Physical Activity: Not on file  Stress: Not on file  Social Connections: Not on file      Allergies:   Allergies  Allergen Reactions   Lisinopril Cough    Labs:  Results for orders placed or performed during the hospital encounter of 08/20/22 (from the past 48 hour(s))  Comprehensive metabolic panel     Status: Abnormal   Collection Time: 08/20/22  8:55 PM  Result Value Ref Range   Sodium 138 135 - 145 mmol/L   Potassium 3.3 (L) 3.5 - 5.1 mmol/L   Chloride 103 98 - 111 mmol/L   CO2 25 22 - 32 mmol/L   Glucose, Bld 93 70 - 99 mg/dL    Comment: Glucose reference range applies only to samples taken after fasting for at least 8 hours.   BUN 14 8 - 23 mg/dL   Creatinine, Ser 1.61 0.61 - 1.24 mg/dL   Calcium 8.7 (L) 8.9 - 10.3 mg/dL   Total Protein 6.7 6.5 - 8.1 g/dL   Albumin 4.0 3.5 - 5.0 g/dL   AST 30 15 - 41 U/L   ALT 26 0 - 44 U/L   Alkaline Phosphatase 73 38 - 126 U/L   Total Bilirubin 1.3 (H) 0.3 - 1.2 mg/dL   GFR, Estimated >09 >60 mL/min    Comment: (NOTE) Calculated  using the CKD-EPI Creatinine Equation (2021)    Anion gap 10 5 - 15    Comment: Performed at [facility], [address], [place], [place] [address]  Ethanol     Status: None   Collection Time: 08/20/22  8:55 PM  Result Value Ref Range   Alcohol, Ethyl (B) <10 <10 mg/dL    Comment: (NOTE) Lowest detectable limit for serum alcohol is 10 mg/dL.  For medical purposes only. Performed at [facility], [address], [place], [place] [address]   CBC with Differential     Status: None   Collection Time: 08/20/22  8:55 PM  Result Value Ref Range   WBC 7.2 4.0 - 10.5 K/uL   RBC 4.38 4.22 - 5.81 MIL/uL   Hemoglobin 13.4 13.0 - 17.0 g/dL   HCT 47.8 29.5 - 62.1 %   MCV 91.8 80.0 - 100.0 fL   MCH 30.6 26.0 - 34.0 pg   MCHC 33.3 30.0 - 36.0 g/dL   RDW 30.8 65.7 - 84.6 %   Platelets 172 150 - 400 K/uL   nRBC 0.0 0.0 - 0.2 %   Neutrophils Relative % 66 %   Neutro Abs 4.7 1.7 - 7.7 K/uL   Lymphocytes Relative 26 %   Lymphs Abs 1.9 0.7 - 4.0 K/uL   Monocytes Relative 8 %   Monocytes Absolute 0.6 0.1 - 1.0 K/uL   Eosinophils Relative 0 %   Eosinophils Absolute 0.0 0.0 - 0.5 K/uL   Basophils Relative 0 %   Basophils Absolute 0.0 0.0 - 0.1 K/uL   Immature Granulocytes 0 %   Abs Immature Granulocytes 0.01 0.00 - 0.07 K/uL    Comment: Performed at [facility], [address], [place], [place] [address]  Valproic acid level     Status: Abnormal   Collection Time: 08/20/22  8:56 PM  Result Value Ref Range  Valproic Acid Lvl 11 (L) 50.0 - 100.0 ug/mL    Comment: Performed at [facility], [address], [place], [place] [address]    Current Facility-Administered Medications  Medication Dose Route Frequency Provider Last Rate Last Admin   acetaminophen (TYLENOL) tablet 650 mg  650 mg Oral Q4H PRN Pricilla Loveless, MD       amLODipine (NORVASC) tablet 5 mg  5 mg Oral Daily Pricilla Loveless, MD   5 mg at 08/21/22  1059   apixaban (ELIQUIS) tablet 5 mg  5 mg Oral BID Pricilla Loveless, MD   5 mg at 08/21/22 0140   atorvastatin (LIPITOR) tablet 40 mg  40 mg Oral Daily Pricilla Loveless, MD   40 mg at 08/21/22 1059   cholecalciferol (VITAMIN D3) 25 MCG (1000 UNIT) tablet 2,000 Units  2,000 Units Oral Daily Pricilla Loveless, MD   2,000 Units at 08/21/22 1059   divalproex (DEPAKOTE SPRINKLE) capsule 125 mg  125 mg Oral BID Motley-Mangrum, Madelline Eshbach A, PMHNP       divalproex (DEPAKOTE SPRINKLE) capsule 250 mg  250 mg Oral QHS Motley-Mangrum, Ademola Vert A, PMHNP       donepezil (ARICEPT) tablet 10 mg  10 mg Oral QHS Pricilla Loveless, MD   10 mg at 08/21/22 0141   [START ON 08/22/2022] escitalopram (LEXAPRO) tablet 10 mg  10 mg Oral Daily Motley-Mangrum, Renalda Locklin A, PMHNP       loperamide (IMODIUM) capsule 2 mg  2 mg Oral PRN Pricilla Loveless, MD       losartan (COZAAR) tablet 100 mg  100 mg Oral Daily Pricilla Loveless, MD       memantine [facility]) tablet 10 mg  10 mg Oral BID Pricilla Loveless, MD   10 mg at 08/21/22 1059   multivitamin with minerals tablet 1 tablet  1 tablet Oral Daily Pricilla Loveless, MD   1 tablet at 08/21/22 1059   QUEtiapine (SEROQUEL) tablet 25 mg  25 mg Oral TID PRN Motley-Mangrum, Ezra Sites, PMHNP       Current Outpatient Medications  Medication Sig Dispense Refill   amLODipine (NORVASC) 5 MG tablet Take 5 mg by mouth daily.     apixaban (ELIQUIS) 5 MG TABS tablet Take 1 tablet (5 mg total) by mouth 2 (two) times daily. 180 tablet 3   atorvastatin (LIPITOR) 40 MG tablet Take 1 tablet (40 mg total) by mouth daily. 90 tablet 3   BETAMETHASONE PO Take by mouth as needed.     Cholecalciferol (VITAMIN D3) 2000 UNITS capsule Take 2,000 Units by mouth daily.     donepezil (ARICEPT) 10 MG tablet Take 1 tablet daily 90 tablet 4   escitalopram (LEXAPRO) 20 MG tablet Take 1 tablet (20 mg total) by mouth daily. 90 tablet 3   loperamide (IMODIUM A-D) 2 MG tablet 1 tablet as needed     losartan (COZAAR) 100 MG tablet  Take 100 mg by mouth daily.     memantine (NAMENDA) 10 MG tablet TAKE 1 TABLET TWICE DAILY 180 tablet 0   multivitamin (THERAGRAN) per tablet Take 1 tablet by mouth daily. Takes two per week      Musculoskeletal:  Observe the patient lying in the bed.  Psychiatric Specialty Exam: Presentation  General Appearance: No data recorded Eye Contact: Good  Speech: Clear and Coherent  Speech Volume: Normal  Handedness:No data recorded  Mood and Affect  Mood: Euthymic  Affect: Appropriate   Thought Process  Thought Processes: Disorganized  Descriptions of Associations:Loose  Orientation:Partial  Thought Content:Other (comment) (UTA)  History of Schizophrenia/Schizoaffective disorder:No  Duration of Psychotic Symptoms:No data recorded Hallucinations:Hallucinations: Other (comment) (UTA)  Ideas of Reference:Other (comment) (UTA)  Suicidal Thoughts:Suicidal Thoughts: -- (UTA)  Homicidal Thoughts:Homicidal Thoughts: -- (UTA)   Sensorium  Memory: Other (comment) (UTA)  Judgment: Impaired  Insight: Other (comment) (UTA)   Executive Functions  Concentration: Fair  Attention Span: Fair  Recall: Poor  Fund of Knowledge: Poor  Language: Fair   Psychomotor Activity  Psychomotor Activity: Psychomotor Activity: Normal   Assets  Assets: Housing; Social Support    Sleep  Sleep: Sleep: Poor   Physical Exam: Physical Exam Vitals and nursing note reviewed. Exam conducted with a chaperone present.  Neurological:     Mental Status: He is alert.  Psychiatric:        Attention and Perception: Attention normal.        Mood and Affect: Mood normal.        Speech: Speech normal.        Behavior: Behavior is agitated.        Cognition and Memory: Memory is impaired.     Comments: Unable to assess patient's thought content and judgment due to diagnosis of dementia.    Review of Systems  Psychiatric/Behavioral:  Positive for memory loss.     Blood pressure 139/86, pulse 68, temperature 97.8 F (36.6 C), temperature source Oral, resp. rate 18, height 6\' 1"  (1.854 m), weight 108.9 kg, SpO2 100 %. Body mass index is 31.66 kg/m.   Medical Decision Making: After careful discussion with Dr. Viviano Simas and Bjorn Pippin (daughter) , it was determined that patient may benefit from a combination of Valproic Acid and Quetiapine to manage agitation, combativeness and reduce demented behaviors in a person with dementia.  Lab review shows his LFTs and albumin are within normal limits. Will order Depakote sprinkles 125 mg daily, and 1600, 250 mg at at bedtime.  Seroquel 25 mg p.o. 3 times daily as needed for agitation.  Continue his Lexapro, but will decrease to 10 mg, then will discontinue in 1 week on Aug 29, 2022.   Discontinued Ativan and Zoloft. Will order VAL on 08/24/2022, and repeat cmp on 08/24/2022 to continue close monitoring of LFTs. TTS will continue to follow from a distance. Patient remains psychiatric cleared at this time.    Thatcher Doberstein MOTLEY-MANGRUM, PMHNP 08/21/2022 11:41 AM

## 2022-08-21 NOTE — ED Notes (Signed)
Pt exited the room insisted staff help him locate something. Security at bedside and instructed pt to walk back into room. Pt increased agitation. Security again instructed pt to walk back to room. Pt began to yell out, verbally attacking staff. Pt then balled fist and swung directly towards security officer's face. [employer] and security at bedside to restrain pt as means to protect staff. MD notified, orders pending.

## 2022-08-21 NOTE — ED Notes (Signed)
Pt resting comfortable in bed at this time. All comfort and safety measures in place in room with bed in lowest position.   ?

## 2022-08-21 NOTE — ED Provider Notes (Signed)
Emergency Medicine Observation Re-evaluation Note  [facility] is a 77 y.o. male, seen on rounds today.  Pt initially presented to the ED for complaints of Aggressive Behavior Currently, the patient is awaiting reevaluation by psychiatric team this morning.  Physical Exam  BP 139/86 (BP Location: Left Arm)   Pulse 68   Temp 97.8 F (36.6 C) (Oral)   Resp 18   Ht 6\' 1"  (1.854 m)   Wt 108.9 kg   SpO2 100%   BMI 31.66 kg/m  Physical Exam Alert in no acute distress  ED Course / MDM  EKG:EKG Interpretation  Date/Time:  Saturday Aug 20 2022 40:98:11 EDT Ventricular Rate:  54 PR Interval:    QRS Duration: 116 QT Interval:  476 QTC Calculation: 452 R Axis:   18 Text Interpretation: Atrial fibrillation Ventricular premature complex Incomplete right bundle branch block Low voltage, precordial leads Confirmed by Pricilla Loveless [phone]) on 08/20/2022 8:32:47 PM  I have reviewed the labs performed to date as well as medications administered while in observation.  Recent changes in the last 24 hours include none.  Plan  Current plan is for have psychiatric team reevaluate patient who is demented aggressive at nursing home.    Bethann Berkshire, MD 08/21/22 [phone]

## 2022-08-21 NOTE — ED Notes (Addendum)
Pt assisted with opening up meal tray. Pt needs assistance with opening items and has to be reminded to eat. Pt is redirectable at this time

## 2022-08-21 NOTE — BH Assessment (Signed)
Comprehensive Clinical Assessment (CCA) Note    08/21/2022 John Buckley [id number]   Disposition: Cecilio Asper, NP recommends continuous observation and pt will be reassessed by psychiatry in the AM.     The patient demonstrates the following risk factors for suicide: Chronic risk factors for suicide include: demographic factors (male, >78 y/o). Acute risk factors for suicide include: social withdrawal/isolation. Protective factors for this patient include: positive social support. Considering these factors, the overall suicide risk at this point appears to be low. Patient is appropriate for outpatient follow up.   Per chart review: Pt is a 77 yo male who  presents to [facility] voluntarily by EMS. Pt was brought  from his living facility [facility]) due to  aggressive behavior.  Pt has hx of dementia.  Pt became aggressive at the facility by trying to lay hands on staff and residents. Pt was seen throwing things on the floor. Staff has been giving his prn ativan without much affect.  Daughter notes he's had dementia for years that's progressive. No previous psychiatric issues.  Pt also threaten to harm staff members yesterday. Pt currently denies SI, Hi, AVH.   Pt was casually dressed and groomed appropriately. Pt was alert and somewhat cooperative. Pt appeared to be anxious throughout the assessment. Pt's had rambling speech and disorganized thought process.     Chief Complaint:  Chief Complaint  Patient presents with   Aggressive Behavior   Visit Diagnosis:   Dementia with behavioral disturbance   CCA Screening, Triage and Referral (STR)  Patient Reported Information How did you hear about [place]? -- (WLED)  What Is the Reason for Your Visit/Call Today? Per chart review: Pt is a 77 yo male who  presents to [facility] voluntarily by EMS. Pt was brought  from his living facility [facility]) due to  aggressive behavior.  Pt has hx of dementia.  Pt became aggressive at  the facility by trying to lay hands on staff and residents. Pt was seen throwing things on the floor. Staff has been giving his prn ativan without much affect.  Daughter notes he's had dementia for years that's progressive. No previous psychiatric issues.  Pt also threaten to harm staff members yesterday. Pt currently denies SI, Hi, AVH.  How Long Has This Been Causing You Problems? > than 6 months  What Do You Feel Would Help You the Most Today? Treatment for Depression or other mood problem; Medication(s)   Have You Recently Had Any Thoughts About Hurting Yourself? No  Are You Planning to Commit Suicide/Harm Yourself At This time? No   Flowsheet Row ED from 08/20/2022 in [facility] Emergency Department at [facility]  C-SSRS RISK CATEGORY No Risk        Have you Recently Had Thoughts About Hurting Someone Karolee Ohs? No  Are You Planning to Harm Someone at This Time? No  Explanation: Pt denies HI at this time.   Have You Used Any Alcohol or Drugs in the Past 24 Hours? No  What Did You Use and How Much? n/a   Do You Currently Have a Therapist/Psychiatrist? -- (UTA)  Name of Therapist/Psychiatrist: Name of Therapist/Psychiatrist: UTA   Have You Been Recently Discharged From Any Office Practice or Programs? No  Explanation of Discharge From Practice/Program: n/a     CCA Screening Triage Referral Assessment Type of Contact: Tele-Assessment  Telemedicine Service Delivery: Telemedicine service delivery: This service was provided via telemedicine using a 2-way, interactive audio and video  technology  Is this Initial or Reassessment? Is this Initial or Reassessment?: Initial Assessment  Date Telepsych consult ordered in [facility]:  Date Telepsych consult ordered in [facility]: 08/20/22  Time Telepsych consult ordered in [facility]:  Time Telepsych consult ordered in [facility]: 2151  Location of Assessment: [facility] ED  Provider Location: [facility] Assessment Services   Collateral Involvement:  None   Does Patient Have a Automotive engineer Guardian? No  Legal Guardian Contact Information: n/a  Copy of Legal Guardianship Form: -- (n/a)  Legal Guardian Notified of Arrival: -- (n/a)  Legal Guardian Notified of Pending Discharge: -- (n/a)  If Minor and Not Living with Parent(s), Who has Custody? n/a  Is CPS involved or ever been involved? Never  Is APS involved or ever been involved? -- (UTA)   Patient Determined To Be At Risk for Harm To Self or Others Based on Review of Patient Reported Information or Presenting Complaint? No  Method: No Plan  Availability of Means: No access or NA  Intent: Vague intent or NA  Notification Required: No need or identified person  Additional Information for Danger to Others Potential: -- (n/a)  Additional Comments for Danger to Others Potential: n/a  Are There Guns or Other Weapons in Your Home? No  Types of Guns/Weapons: n/a  Are These Weapons Safely Secured?                            Yes  Who Could Verify You Are Able To Have These Secured: n/a  Do You Have any Outstanding Charges, Pending Court Dates, Parole/Probation? n/a  Contacted To Inform of Risk of Harm To Self or Others: -- (n/a)    Does Patient Present under Involuntary Commitment? No    [place] of Residence: [place]   Patient Currently Receiving the Following Services: -- (UTA)   Determination of Need: Urgent (48 hours)   Options For Referral: -- (observation overnight and reassed in the AM)     CCA Biopsychosocial Patient Reported Schizophrenia/Schizoaffective Diagnosis in Past: No   Strengths: UTA   Mental Health Symptoms Depression:   None   Duration of Depressive symptoms:    Mania:   Racing thoughts   Anxiety:    Irritability   Psychosis:   None   Duration of Psychotic symptoms:    Trauma:   None   Obsessions:   None   Compulsions:   None   Inattention:   None   Hyperactivity/Impulsivity:   None    Oppositional/Defiant Behaviors:   None   Emotional Irregularity:   None   Other Mood/Personality Symptoms:   UTA    Mental Status Exam Appearance and self-care  Stature:   Average   Weight:   Average weight   Clothing:   Casual   Grooming:   Normal   Cosmetic use:   None   Posture/gait:   Normal   Motor activity:   Repetitive   Sensorium  Attention:   Inattentive   Concentration:   Scattered   Orientation:   Time; Situation; Place; Person   Recall/memory:   Defective in Remote; Defective in Recent; Defective in Short-term   Affect and Mood  Affect:   Anxious; Labile   Mood:   Anxious; Irritable   Relating  Eye contact:   Fleeting   Facial expression:   Anxious   Attitude toward examiner:   Uninterested   Thought and Language  Speech flow:  Flight of Ideas  Thought content:   Appropriate to Mood and Circumstances   Preoccupation:   -- (n/a)   Hallucinations:   None   Organization:   Disorganized   Company secretary of Knowledge:   Poor   Intelligence:   Average   Abstraction:   Normal   Judgement:   Impaired   Reality Testing:   Distorted   Insight:   Lacking   Decision Making:   Impulsive   Social Functioning  Social Maturity:   Impulsive; Irresponsible   Social Judgement:   Normal   Stress  Stressors:   Housing   Coping Ability:   Overwhelmed; Exhausted   Skill Deficits:   Activities of daily living; Decision making; Self-control   Supports:   Family     Religion: Religion/Spirituality Are You A Religious Person?: No How Might This Affect Treatment?: n/a  Leisure/Recreation: Leisure / Recreation Do You Have Hobbies?: No  Exercise/Diet: Exercise/Diet Have You Gained or Lost A Significant Amount of Weight in the Past Six Months?: No Do You Follow a Special Diet?: No Do You Have Any Trouble Sleeping?: No   CCA Employment/Education Employment/Work Situation: Employment  / Work Academic librarian Situation: Retired Passenger transport manager has Been Impacted by Current Illness: No Has Patient ever Been in Equities trader?: No  Education: Education Is Patient Currently Attending School?: No Last Grade Completed: 12 Did You Product manager?: No Did You Have An Individualized Education Program (IIEP): No Did You Have Any Difficulty At [employer]?: No Patient's Education Has Been Impacted by Current Illness: No   CCA Family/Childhood History Family and Relationship History: Family history Marital status: Single Does patient have children?: Yes How many children?: 1 How is patient's relationship with their children?: UTA  Childhood History:  Childhood History By whom was/is the patient raised?:  (UTA) Did patient suffer any verbal/emotional/physical/sexual abuse as a child?:  (UTA) Did patient suffer from severe childhood neglect?:  (UTA) Has patient ever been sexually abused/assaulted/raped as an adolescent or adult?:  (UTA) Was the patient ever a victim of a crime or a disaster?:  (UTA) Witnessed domestic violence?:  (UTA) Has patient been affected by domestic violence as an adult?:  (UTA)       CCA Substance Use Alcohol/Drug Use: Alcohol / Drug Use Pain Medications: See MAR Prescriptions: See MAR Over the Counter: See MAR History of alcohol / drug use?: No history of alcohol / drug abuse Longest period of sobriety (when/how long): n/a Negative Consequences of Use:  (n/a) Withdrawal Symptoms:  (n/a)                         ASAM's:  Six Dimensions of Multidimensional Assessment  Dimension 1:  Acute Intoxication and/or Withdrawal Potential:      Dimension 2:  Biomedical Conditions and Complications:      Dimension 3:  Emotional, Behavioral, or Cognitive Conditions and Complications:     Dimension 4:  Readiness to Change:     Dimension 5:  Relapse, Continued use, or Continued Problem Potential:     Dimension 6:  Recovery/Living  Environment:     ASAM Severity Score:    ASAM Recommended Level of Treatment: ASAM Recommended Level of Treatment:  (n/a)   Substance use Disorder (SUD) Substance Use Disorder (SUD)  Checklist Symptoms of Substance Use:  (n/a)  Recommendations for Services/Supports/Treatments: Recommendations for Services/Supports/Treatments Recommendations For Services/Supports/Treatments: Medication Management  Discharge Disposition:    DSM5 Diagnoses: Patient Active Problem List   Diagnosis  Date Noted   Encounter for therapeutic drug monitoring 01/10/2017   Obesity 11/05/2010   HTN (hypertension) 11/05/2010   Hyperlipidemia 11/05/2010   Atrial fibrillation (HCC) 07/13/2010     Referrals to Alternative Service(s): Referred to Alternative Service(s):   Place:   Date:   Time:    Referred to Alternative Service(s):   Place:   Date:   Time:    Referred to Alternative Service(s):   Place:   Date:   Time:    Referred to Alternative Service(s):   Place:   Date:   Time:     Dava Najjar, MA,LCMHCA,NCC

## 2022-08-22 DIAGNOSIS — F03918 Unspecified dementia, unspecified severity, with other behavioral disturbance: Secondary | ICD-10-CM

## 2022-08-22 MED ORDER — DIVALPROEX SODIUM 125 MG PO CSDR: 250.0000 mg | DELAYED_RELEASE_CAPSULE | Freq: Every day | ORAL | 0 refills | Status: DC

## 2022-08-22 MED ORDER — QUETIAPINE FUMARATE 25 MG PO TABS: 25.0000 mg | ORAL_TABLET | Freq: Two times a day (BID) | ORAL | 0 refills | Status: DC | PRN

## 2022-08-22 MED ORDER — DIVALPROEX SODIUM 125 MG PO CSDR: 125.0000 mg | DELAYED_RELEASE_CAPSULE | Freq: Two times a day (BID) | ORAL | 0 refills | Status: DC

## 2022-08-22 MED ORDER — MEMANTINE HCL 10 MG PO TABS: 10.0000 mg | ORAL_TABLET | Freq: Two times a day (BID) | ORAL | 0 refills | Status: AC

## 2022-08-22 MED ORDER — DONEPEZIL HCL 10 MG PO TABS: 10.0000 mg | ORAL_TABLET | Freq: Every day | ORAL | 0 refills | Status: AC

## 2022-08-22 MED ORDER — ESCITALOPRAM OXALATE 10 MG PO TABS: 10.0000 mg | ORAL_TABLET | Freq: Every day | ORAL | 0 refills | Status: DC

## 2022-08-22 NOTE — Discharge Summary (Signed)
[facility] Psych ED Discharge  08/22/2022 11:33 AM MAZON SMEDLEY  MRN:  [id number]  Principal Problem: Dementia with behavioral disturbance [facility]) Discharge Diagnoses: Principal Problem:   Dementia with behavioral disturbance [facility])  Clinical Impression:  Final diagnoses:  None   Subjective:  Per Triage Note: EMS reports-Patient comes from [facility] facility.  As per staff patient was combative to staff. They would like patient to have psych eval. Ems was not able to get vitals. However, EMS stated patient was calm.  Patient had medication changes made since arrival-Depakote and Seroquel.  His Lexapro is decreased from 20 mg to 10 mg to avoid over stimulating him.  Seroquel is for agitation/Aggression which so far we have not witnessed any.  He is eating well, feeds himself.  He had shower this morning with limited assistance.  Patient is alert and oriented to his name.  His memory is impaired and he does not remain on topic.  Patient does not know how and why he came to the ER.  Patient smiles when asked any question.   Provider called his daughter and we discussed discharge plan, father's improved mood and Medication changes made.  She is in agreement to send patient back to the facility. Patient sees a mental health provider at the facility.  Daughter and facility will arrange transportation back to facility today.   ED Assessment Time Calculation: Start Time: 1047 Stop Time: 1115 Total Time in Minutes (Assessment Completion): 28   Past Psychiatric History: see initial psychiatry evaluation note  Past Medical History:  Past Medical History:  Diagnosis Date   Atrial fibrillation (HCC)    Hyperlipidemia    Hypertension    Long-term (current) use of anticoagulants    Tobacco abuse     Past Surgical History:  Procedure Laterality Date   CARDIAC CATHETERIZATION  01/15/2003   Est. EF is around 65% --  Relatively smooth coronary arteries.  He has a few minor luminal irregularities --  Normal left ventricular systolic function -- Vesta Mixer, M.D.   CARDIOVERSION  08/04/20006   was successful   COLONOSCOPY W/ POLYPECTOMY  12/02/2003   A moderate-sized rectal polyp at 15 cm -- [name], M.D.   NOSE SURGERY     TONSILLECTOMY     Family History:  Family History  Problem Relation Age of Onset   Alzheimer's disease Mother    Colon cancer Other        family history of    Family Psychiatric  History: see initial Psychiatry evaluation note Social History:  Social History   Substance and Sexual Activity  Alcohol Use Yes   Comment: once in a while      Social History   Substance and Sexual Activity  Drug Use No    Social History   Socioeconomic History   Marital status: Married    Spouse name: Not on file   Number of children: 2   Years of education: 14   Highest education level: Not on file  Occupational History   Occupation: retired  Tobacco Use   Smoking status: Former    Packs/day: 0.50    Years: 39.00    Additional pack years: 0.00    Total pack years: 19.50    Types: Cigarettes   Smokeless tobacco: Never  Vaping Use   Vaping Use: Never used  Substance and Sexual Activity   Alcohol use: Yes    Comment: once in a while    Drug use: No  Sexual activity: Not on file  Other Topics Concern   Not on file  Social History Narrative   Lives with wife in two story home      Takes frequent walks with dog      Right handed      Highest level of edu- certificate program   Social Determinants of Health   Financial Resource Strain: Not on file  Food Insecurity: Not on file  Transportation Needs: Not on file  Physical Activity: Not on file  Stress: Not on file  Social Connections: Not on file    Tobacco Cessation:  N/A, patient does not currently use tobacco products  Current Medications: Current Facility-Administered Medications  Medication Dose Route Frequency Provider Last Rate Last Admin   acetaminophen (TYLENOL) tablet 650  mg  650 mg Oral Q4H PRN Pricilla Loveless, MD   650 mg at 08/21/22 1819   amLODipine (NORVASC) tablet 5 mg  5 mg Oral Daily Pricilla Loveless, MD   5 mg at 08/22/22 1028   apixaban (ELIQUIS) tablet 5 mg  5 mg Oral BID Pricilla Loveless, MD   5 mg at 08/22/22 1028   atorvastatin (LIPITOR) tablet 40 mg  40 mg Oral Daily Pricilla Loveless, MD   40 mg at 08/22/22 1028   cholecalciferol (VITAMIN D3) 25 MCG (1000 UNIT) tablet 2,000 Units  2,000 Units Oral Daily Pricilla Loveless, MD   2,000 Units at 08/22/22 1028   divalproex (DEPAKOTE SPRINKLE) capsule 125 mg  125 mg Oral BID Motley-Mangrum, Jadeka A, PMHNP   125 mg at 08/22/22 1028   divalproex (DEPAKOTE SPRINKLE) capsule 250 mg  250 mg Oral QHS Motley-Mangrum, Jadeka A, PMHNP   250 mg at 08/21/22 2128   donepezil (ARICEPT) tablet 10 mg  10 mg Oral QHS Pricilla Loveless, MD   10 mg at 08/21/22 2129   escitalopram (LEXAPRO) tablet 10 mg  10 mg Oral Daily Motley-Mangrum, Jadeka A, PMHNP   10 mg at 08/22/22 1028   loperamide (IMODIUM) capsule 2 mg  2 mg Oral PRN Pricilla Loveless, MD       losartan (COZAAR) tablet 100 mg  100 mg Oral Daily Pricilla Loveless, MD   100 mg at 08/22/22 1028   memantine (NAMENDA) tablet 10 mg  10 mg Oral BID Pricilla Loveless, MD   10 mg at 08/22/22 1028   multivitamin with minerals tablet 1 tablet  1 tablet Oral Daily Pricilla Loveless, MD   1 tablet at 08/22/22 1028   QUEtiapine (SEROQUEL) tablet 25 mg  25 mg Oral TID PRN Motley-Mangrum, Jadeka A, PMHNP   25 mg at 08/21/22 1819   Current Outpatient Medications  Medication Sig Dispense Refill   amLODipine (NORVASC) 5 MG tablet Take 5 mg by mouth daily.     apixaban (ELIQUIS) 5 MG TABS tablet Take 1 tablet (5 mg total) by mouth 2 (two) times daily. 180 tablet 3   atorvastatin (LIPITOR) 40 MG tablet Take 1 tablet (40 mg total) by mouth daily. 90 tablet 3   CALMOSEPTINE 0.44-20.6 % OINT Apply 1 Application topically 2 (two) times daily.     hydrocortisone cream 1 % Apply 1 Application topically  2 (two) times daily.     multivitamin (THERAGRAN) per tablet Take 1 tablet by mouth daily. Takes two per week     BETAMETHASONE PO Take by mouth as needed. (Patient not taking: Reported on 08/21/2022)     Cholecalciferol (VITAMIN D3) 2000 UNITS capsule Take 2,000 Units by mouth daily. (Patient not taking:  Reported on 08/21/2022)     divalproex (DEPAKOTE SPRINKLE) 125 MG capsule Take 1 capsule (125 mg total) by mouth 2 (two) times daily at 10 am and 4 pm. 60 capsule 0   divalproex (DEPAKOTE SPRINKLE) 125 MG capsule Take 2 capsules (250 mg total) by mouth at bedtime. 60 capsule 0   donepezil (ARICEPT) 10 MG tablet Take 1 tablet (10 mg total) by mouth at bedtime. 30 tablet 0   [START ON 08/23/2022] escitalopram (LEXAPRO) 10 MG tablet Take 1 tablet (10 mg total) by mouth daily. 30 tablet 0   loperamide (IMODIUM A-D) 2 MG tablet 1 tablet as needed (Patient not taking: Reported on 08/21/2022)     losartan (COZAAR) 100 MG tablet Take 100 mg by mouth daily. (Patient not taking: Reported on 08/21/2022)     memantine (NAMENDA) 10 MG tablet Take 1 tablet (10 mg total) by mouth 2 (two) times daily. 60 tablet 0   QUEtiapine (SEROQUEL) 25 MG tablet Take 1 tablet (25 mg total) by mouth 2 (two) times daily as needed (dementia with behavioral disturbances). 45 tablet 0   PTA Medications: (Not in a hospital admission)   [place] Scale:  Flowsheet Row ED from 08/20/2022 in [facility] Emergency Department at [facility]  C-SSRS RISK CATEGORY No Risk       Musculoskeletal: Strength & Muscle Tone: within normal limits Gait & Station: normal Patient leans: Front  Psychiatric Specialty Exam: Presentation  General Appearance: Casual; Well Groomed; Neat  Eye Contact: Good  Speech: Clear and Coherent  Speech Volume: Normal  Handedness:No data recorded  Mood and Affect  Mood: Euthymic  Affect: Congruent; Appropriate   Thought Process  Thought Processes: Disorganized  Descriptions of  Associations:Loose  Orientation:Partial  Thought Content:-- (UTA-Memory/cognitive impaired due to Dementia)  History of Schizophrenia/Schizoaffective disorder:No  Duration of Psychotic Symptoms:No data recorded Hallucinations:Hallucinations: -- Rich Reining)  Ideas of Reference:-- Rich Reining)  Suicidal Thoughts:Suicidal Thoughts: No  Homicidal Thoughts:Homicidal Thoughts: No   Sensorium  Memory: Other (comment) (uta-Impaired memory)  Judgment: Impaired  Insight: Lacking   Executive Functions  Concentration: Fair  Attention Span: Fair  Recall: Poor  Fund of Knowledge: Poor  Language: Fair   Psychomotor Activity  Psychomotor Activity: Psychomotor Activity: Normal   Assets  Assets: Housing; Social Support   Sleep  Sleep: Sleep: Fair    Physical Exam: Physical Exam Vitals and nursing note reviewed.  Constitutional:      Appearance: Normal appearance.  HENT:     Head: Normocephalic and atraumatic.     Nose: Nose normal.  Cardiovascular:     Rate and Rhythm: Normal rate and regular rhythm.  Pulmonary:     Effort: Pulmonary effort is normal.  Musculoskeletal:        General: Normal range of motion.     Cervical back: Normal range of motion.  Skin:    General: Skin is warm and dry.  Neurological:     General: No focal deficit present.     Mental Status: He is alert.  Psychiatric:        Attention and Perception: Attention and perception normal.        Mood and Affect: Mood and affect normal.        Speech: Speech normal.        Behavior: Behavior normal. Behavior is cooperative.        Cognition and Memory: Cognition is impaired. Memory is impaired.    Review of Systems  Constitutional: Negative.   HENT: Negative.  Eyes: Negative.   Cardiovascular: Negative.   Genitourinary: Negative.   Musculoskeletal: Negative.   Skin: Negative.   Neurological: Negative.   Psychiatric/Behavioral:  Positive for memory loss.    Blood pressure (!)  152/82, pulse 61, temperature 98.1 F (36.7 C), temperature source Axillary, resp. rate 15, height 6\' 1"  (1.854 m), weight 108.9 kg, SpO2 98 %. Body mass index is 31.66 kg/m.   Demographic Factors:  Male, Age 20 or older, and Caucasian  Loss Factors: NA  Historical Factors: NA  Risk Reduction Factors:   Living with another person, especially a relative and lives in a memory care unit.  Spose and daughter involved in his care.  Continued Clinical Symptoms:  Previous Psychiatric Diagnoses and Treatments  Cognitive Features That Contribute To Risk:  None    Suicide Risk:  Minimal: No identifiable suicidal ideation.  Patients presenting with no risk factors but with morbid ruminations; may be classified as minimal risk based on the severity of the depressive symptoms    Plan Of Care/Follow-up recommendations:  Activity:  as tolerated Diet:  Regular  Medical Decision Making: Patient was seen this morning alert and oriented to his name only.  Friendly, calm amd cooperative.  He denies SI/HI and did npt appear to be responding to internal Stimuli.  With Medication changes made patient is calm and no longer agitated so far.  He is Cleared to go back to the facility he came from. Patient will continue taking Seroquel 25 mg po bid as needed for agitation/aggression Depakote Sprinkle 125 mg po bid-10 am and 4 pm for mood control Donepezil 10 mg at bed time for memory Namenda 10 mg po bid for Memory Lexapro 10 mg for depression and anxiety.  Disposition: Psychiatrically cleared.  Earney Navy, NP-PMHNP-BC 08/22/2022, 11:33 AM

## 2022-08-22 NOTE — ED Provider Notes (Addendum)
  Physical Exam  BP (!) 152/82 (BP Location: Right Arm)   Pulse 61   Temp 98.1 F (36.7 C) (Axillary)   Resp 15   Ht 6\' 1"  (1.854 m)   Wt 108.9 kg   SpO2 98%   BMI 31.66 kg/m   Physical Exam  Procedures  Procedures  ED Course / MDM    Medical Decision Making Amount and/or Complexity of Data Reviewed Labs: ordered. Radiology: ordered.  Risk OTC drugs. Prescription drug management.   Patient with dementia.  Worsening activity.  Has been seen by psychiatry.  Has adjustment of some medications.  Reportedly reeval today.  Seen by psychiatry and cleared for discharge.  Medications ordered by psychiatry.       Benjiman Core, MD 08/22/22 1610    Benjiman Core, MD 08/22/22 1251

## 2022-08-22 NOTE — Discharge Instructions (Addendum)
Follow-up with your doctors as needed. 

## 2022-09-01 ENCOUNTER — Ambulatory Visit: Payer: Medicare Other | Admitting: Cardiovascular Disease

## 2022-09-01 ENCOUNTER — Emergency Department (HOSPITAL_COMMUNITY)
Admission: EM | Admit: 2022-09-01 | Discharge: 2022-09-03 | Disposition: A | Discharge status: Other Acute Inpatient Hospital | Payer: Medicare Other | Attending: Emergency Medicine | Admitting: Emergency Medicine

## 2022-09-01 ENCOUNTER — Encounter (HOSPITAL_COMMUNITY): Payer: Self-pay

## 2022-09-01 ENCOUNTER — Encounter: Payer: Self-pay | Admitting: Physician Assistant

## 2022-09-01 DIAGNOSIS — Z7901 Long term (current) use of anticoagulants: Secondary | ICD-10-CM | POA: Insufficient documentation

## 2022-09-01 DIAGNOSIS — F03918 Unspecified dementia, unspecified severity, with other behavioral disturbance: Secondary | ICD-10-CM | POA: Diagnosis not present

## 2022-09-01 DIAGNOSIS — R2689 Other abnormalities of gait and mobility: Secondary | ICD-10-CM | POA: Insufficient documentation

## 2022-09-01 DIAGNOSIS — F29 Unspecified psychosis not due to a substance or known physiological condition: Secondary | ICD-10-CM | POA: Diagnosis not present

## 2022-09-01 DIAGNOSIS — R456 Violent behavior: Secondary | ICD-10-CM | POA: Diagnosis present

## 2022-09-01 DIAGNOSIS — R4689 Other symptoms and signs involving appearance and behavior: Secondary | ICD-10-CM | POA: Diagnosis present

## 2022-09-01 DIAGNOSIS — F03911 Unspecified dementia, unspecified severity, with agitation: Secondary | ICD-10-CM | POA: Diagnosis not present

## 2022-09-01 LAB — CBC WITH DIFFERENTIAL/PLATELET
Abs Immature Granulocytes: 0.03 10*3/uL (ref 0.00–0.07)
Basophils Absolute: 0 10*3/uL (ref 0.0–0.1)
Basophils Relative: 0 %
Eosinophils Absolute: 0 10*3/uL (ref 0.0–0.5)
Eosinophils Relative: 0 %
HCT: 40.4 % (ref 39.0–52.0)
Hemoglobin: 13.5 g/dL (ref 13.0–17.0)
Immature Granulocytes: 0 %
Lymphocytes Relative: 26 %
Lymphs Abs: 1.9 10*3/uL (ref 0.7–4.0)
MCH: 31.3 pg (ref 26.0–34.0)
MCHC: 33.4 g/dL (ref 30.0–36.0)
MCV: 93.5 fL (ref 80.0–100.0)
Monocytes Absolute: 0.9 10*3/uL (ref 0.1–1.0)
Monocytes Relative: 12 %
Neutro Abs: 4.4 10*3/uL (ref 1.7–7.7)
Neutrophils Relative %: 62 %
Platelets: 178 10*3/uL (ref 150–400)
RBC: 4.32 MIL/uL (ref 4.22–5.81)
RDW: 13.5 % (ref 11.5–15.5)
WBC: 7.3 10*3/uL (ref 4.0–10.5)
nRBC: 0 % (ref 0.0–0.2)

## 2022-09-01 LAB — COMPREHENSIVE METABOLIC PANEL
ALT: 33 U/L (ref 0–44)
AST: 45 U/L — ABNORMAL HIGH (ref 15–41)
Albumin: 3.8 g/dL (ref 3.5–5.0)
Alkaline Phosphatase: 70 U/L (ref 38–126)
Anion gap: 10 (ref 5–15)
BUN: 19 mg/dL (ref 8–23)
CO2: 25 mmol/L (ref 22–32)
Calcium: 8.8 mg/dL — ABNORMAL LOW (ref 8.9–10.3)
Chloride: 102 mmol/L (ref 98–111)
Creatinine, Ser: 1.07 mg/dL (ref 0.61–1.24)
GFR, Estimated: >60 mL/min (ref >60)
Glucose, Bld: 119 mg/dL — ABNORMAL HIGH (ref 70–99)
Potassium: 3.4 mmol/L — ABNORMAL LOW (ref 3.5–5.1)
Sodium: 137 mmol/L (ref 135–145)
Total Bilirubin: 1.3 mg/dL — ABNORMAL HIGH (ref 0.3–1.2)
Total Protein: 6.8 g/dL (ref 6.5–8.1)

## 2022-09-01 LAB — ETHANOL: Alcohol, Ethyl (B): <10 mg/dL (ref <10)

## 2022-09-01 MED ORDER — DIVALPROEX SODIUM 125 MG PO CSDR
125.0000 mg | DELAYED_RELEASE_CAPSULE | ORAL | Status: DC
  Administered 2022-09-02: 125 mg via ORAL
  Filled 2022-09-01: qty 1

## 2022-09-01 MED ORDER — MEMANTINE HCL 5 MG PO TABS
10.0000 mg | ORAL_TABLET | Freq: Two times a day (BID) | ORAL | Status: DC
  Administered 2022-09-01 – 2022-09-03 (×4): 10 mg via ORAL
  Filled 2022-09-01 (×4): qty 2

## 2022-09-01 MED ORDER — QUETIAPINE FUMARATE 25 MG PO TABS
25.0000 mg | ORAL_TABLET | Freq: Two times a day (BID) | ORAL | Status: DC | PRN
  Administered 2022-09-01 – 2022-09-02 (×3): 25 mg via ORAL
  Filled 2022-09-01 (×3): qty 1

## 2022-09-01 MED ORDER — DIVALPROEX SODIUM 125 MG PO CSDR: 250.0000 mg | DELAYED_RELEASE_CAPSULE | Freq: Every day | ORAL | Status: DC

## 2022-09-01 MED ORDER — HALOPERIDOL LACTATE 5 MG/ML IJ SOLN
5.0000 mg | Freq: Once | INTRAMUSCULAR | Status: AC
  Administered 2022-09-01: 5 mg via INTRAMUSCULAR
  Filled 2022-09-01: qty 1

## 2022-09-01 MED ORDER — DONEPEZIL HCL 5 MG PO TABS
10.0000 mg | ORAL_TABLET | Freq: Every day | ORAL | Status: DC
  Administered 2022-09-01 – 2022-09-02 (×2): 10 mg via ORAL
  Filled 2022-09-01 (×2): qty 2

## 2022-09-01 MED ORDER — ZIPRASIDONE MESYLATE 20 MG IM SOLR
10.0000 mg | Freq: Once | INTRAMUSCULAR | Status: AC
  Administered 2022-09-01: 10 mg via INTRAMUSCULAR
  Filled 2022-09-01: qty 20

## 2022-09-01 MED ORDER — APIXABAN 5 MG PO TABS
5.0000 mg | ORAL_TABLET | Freq: Two times a day (BID) | ORAL | Status: DC
  Administered 2022-09-01 – 2022-09-03 (×4): 5 mg via ORAL
  Filled 2022-09-01 (×4): qty 1

## 2022-09-01 MED ORDER — POTASSIUM CHLORIDE CRYS ER 20 MEQ PO TBCR: 20.0000 meq | EXTENDED_RELEASE_TABLET | Freq: Once | ORAL | Status: DC

## 2022-09-01 MED ORDER — DIVALPROEX SODIUM 125 MG PO CSDR
250.0000 mg | DELAYED_RELEASE_CAPSULE | Freq: Every day | ORAL | Status: DC
  Administered 2022-09-01: 250 mg via ORAL
  Filled 2022-09-01: qty 2

## 2022-09-01 MED ORDER — STERILE WATER FOR INJECTION IJ SOLN
INTRAMUSCULAR | Status: AC
  Filled 2022-09-01: qty 10

## 2022-09-01 NOTE — ED Triage Notes (Signed)
Pt was sent from [facility] for increased aggressive behavior today. No complaints. Pt is calm and cooperative at triage and redirectable.

## 2022-09-01 NOTE — ED Provider Notes (Addendum)
[facility] AT [facility] Provider Note   CSN: [id number] Arrival date & time: 09/01/22  2026     History  Chief Complaint  Patient presents with   Aggressive Behavior    John Buckley is a 77 y.o. male with a past medical history of dementia with behavioral disturbance presenting today from his memory care facility.  He allegedly became aggressive with staff and other patients and they said they can no longer take care of him and sent him to the emergency department.  Patient very pleasant and in the emergency department at this time.  Oriented to self only.  Patient presented similarly 2 weeks ago and was ultimately cleared by psychiatry to return to his living facility.  Today they will not take him back.  HPI     Home Medications Prior to Admission medications   Medication Sig Start Date End Date Taking? Authorizing Provider  amLODipine (NORVASC) 5 MG tablet Take 5 mg by mouth daily.   Yes [provider]  apixaban (ELIQUIS) 5 MG TABS tablet Take 1 tablet (5 mg total) by mouth 2 (two) times daily. 06/21/22  Yes Nahser, Deloris Ping, MD  atorvastatin (LIPITOR) 40 MG tablet Take 1 tablet (40 mg total) by mouth daily. 05/10/11  Yes Nahser, Deloris Ping, MD  CALPROTECT 0.44-20.6 % OINT Apply 1 application  topically See admin instructions. Apply a liberal amount of ointment to areas of redness on the buttocks 2 times a day   Yes [provider]  Cholecalciferol (VITAMIN D3) 50 MCG (2000 UT) TABS Take 2,000 Units by mouth daily.   Yes [provider]  divalproex (DEPAKOTE SPRINKLE) 125 MG capsule Take 2 capsules (250 mg total) by mouth at bedtime. Patient taking differently: Take 250 mg by mouth 3 (three) times daily. 08/22/22 09/21/22 Yes Earney Navy, NP  donepezil (ARICEPT) 10 MG tablet Take 1 tablet (10 mg total) by mouth at bedtime. 08/22/22 09/21/22 Yes Earney Navy, NP  loperamide (IMODIUM A-D) 2 MG tablet Take 2 mg by  mouth 4 (four) times daily as needed for diarrhea or loose stools (not to exceed 4 tablets in 24 hours).   Yes [provider]  LORazepam (ATIVAN) 0.5 MG tablet Take 0.5 mg by mouth every 8 (eight) hours as needed (for agitation).   Yes [provider]  LORazepam (ATIVAN) 1 MG tablet Take 1 mg by mouth in the morning and at bedtime.   Yes [provider]  losartan (COZAAR) 100 MG tablet Take 100 mg by mouth daily.   Yes [provider]  memantine (NAMENDA) 10 MG tablet Take 1 tablet (10 mg total) by mouth 2 (two) times daily. 08/22/22 09/21/22 Yes Earney Navy, NP  Multiple Vitamin (DAILY VITE) TABS Take 1 tablet by mouth daily with breakfast.   Yes [provider]  OLANZapine (ZYPREXA) 2.5 MG tablet Take 2.5 mg by mouth in the morning and at bedtime.   Yes [provider]  sertraline (ZOLOFT) 25 MG tablet Take 25 mg by mouth in the morning.   Yes [provider]  sertraline (ZOLOFT) 50 MG tablet Take 50 mg by mouth in the morning.   Yes [provider]  divalproex (DEPAKOTE SPRINKLE) 125 MG capsule Take 1 capsule (125 mg total) by mouth 2 (two) times daily at 10 am and 4 pm. Patient not taking: Reported on 09/01/2022 08/22/22 09/21/22  Earney Navy, NP  escitalopram (LEXAPRO) 10 MG tablet Take 1  tablet (10 mg total) by mouth daily. Patient not taking: Reported on 09/01/2022 08/23/22 09/22/22  Earney Navy, NP  QUEtiapine (SEROQUEL) 25 MG tablet Take 1 tablet (25 mg total) by mouth 2 (two) times daily as needed (dementia with behavioral disturbances). Patient not taking: Reported on 09/01/2022 08/22/22 09/21/22  Earney Navy, NP      Allergies    Lisinopril    Review of Systems   Review of Systems  Physical Exam Updated Vital Signs BP 131/79   Pulse 85   Temp 97.9 F (36.6 C) (Oral)   Resp 19   Ht 6\' 1"  (1.854 m)   Wt 108.9 kg   SpO2 95%   BMI 31.67 kg/m  Physical Exam Vitals and nursing note  reviewed.  Constitutional:      Appearance: Normal appearance.  HENT:     Head: Normocephalic and atraumatic.  Eyes:     General: No scleral icterus.    Conjunctiva/sclera: Conjunctivae normal.  Pulmonary:     Effort: Pulmonary effort is normal. No respiratory distress.  Skin:    Findings: No rash.  Neurological:     Mental Status: He is alert.     Comments: Oriented to self only  Psychiatric:        Mood and Affect: Mood normal.     ED Results / Procedures / Treatments   Labs (all labs ordered are listed, but only abnormal results are displayed) Labs Reviewed  COMPREHENSIVE METABOLIC PANEL - Abnormal; Notable for the following components:      Result Value   Potassium 3.4 (*)    Glucose, Bld 119 (*)    Calcium 8.8 (*)    AST 45 (*)    Total Bilirubin 1.3 (*)    All other components within normal limits  ETHANOL  CBC WITH DIFFERENTIAL/PLATELET  URINALYSIS, ROUTINE W REFLEX MICROSCOPIC    EKG None  Radiology No results found.  Procedures Procedures   Medications Ordered in ED Medications  apixaban (ELIQUIS) tablet 5 mg (5 mg Oral Given 09/01/22 2244)  donepezil (ARICEPT) tablet 10 mg (10 mg Oral Given 09/01/22 2244)  memantine (NAMENDA) tablet 10 mg (10 mg Oral Given 09/01/22 2244)  QUEtiapine (SEROQUEL) tablet 25 mg (25 mg Oral Given 09/01/22 2244)  divalproex (DEPAKOTE SPRINKLE) capsule 125 mg (has no administration in time range)  divalproex (DEPAKOTE SPRINKLE) capsule 250 mg (250 mg Oral Given 09/01/22 2244)  potassium chloride SA (KLOR-CON M) CR tablet 20 mEq (has no administration in time range)  haloperidol lactate (HALDOL) injection 5 mg (5 mg Intramuscular Given 09/01/22 2130)  ziprasidone (GEODON) injection 10 mg (10 mg Intramuscular Given 09/01/22 2155)  sterile water (preservative free) injection (  Given 09/01/22 2130)    ED Course/ Medical Decision Making/ A&P Clinical Course as of 09/01/22 2235  Thu Sep 01, 2022  2137 Attempted to call the  patient's wife with no answer. [MR]  2139 I spoke with the patient's daughter on the phone.  She reports that the aggression and behavior that her father is displaying now is relatively new.  He does have an outpatient neurologist however he does not often see that neurologist and mostly follows with the neuro and memory care team at his facility.  He has had multiple instances of aggression and they notified the family that the facility was no longer comfortable taking care of him.  I asked the patient's daughter if there were anyone else in the family who would be willing to take care  of him and tell further placement could take place and she said that nobody felt comfortable caring for him due to his aggression. [MR]    Clinical Course User Index [MR] Lary Eckardt, Gabriel Cirri, PA-C                             Medical Decision Making Amount and/or Complexity of Data Reviewed Labs: ordered.  Risk Prescription drug management.   77 year old male presenting today with concern for dementia with behavioral disturbance.  He was sent here from his memory care facility who will no longer accept the patient due to his aggression towards staff members and other residents.  Family also is not comfortable having the patient at home.  I had a long conversation with the patient's daughter about the course of trying to find placement from the emergency department.  She understands that the patient may become a border for several days, potentially weeks waiting for placement in a facility.  She was encouraged to speak with the patient's outpatient providers as well so that we all may work to find a safe living situation for the patient.  Patient originally was very pleasant however about an hour into his stay he started to become agitated.  He was pacing the halls and picking up equipment and yelling at different staff members. Originally tried Haldol and redirection however patient continued to be agitated reviewed  patient's chart and he was recently here for similar complaint.  He required Geodon at that time, will discharge on now.  10:30pm: Patient reevaluated Geodon and is resting comfortably.  Protecting his airway.  Urine pending at this time.  Night shift PA, Army Melia, will follow-up on urinalysis.  Ultimately patient will likely become a border in the emergency department.  Psych, PT/OT and social work have been consulted and hopefully will weigh and tomorrow.  Final Clinical Impression(s) / ED Diagnoses Final diagnoses:  Dementia with agitation, unspecified dementia severity, unspecified dementia type [facility])    Rx / DC Orders ED Discharge Orders     None        Erryn Dickison, Gabriel Cirri, PA-C 09/01/22 2257    Benjiman Core, MD 09/02/22 2345
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
## 2022-09-02 DIAGNOSIS — F03918 Unspecified dementia, unspecified severity, with other behavioral disturbance: Secondary | ICD-10-CM | POA: Diagnosis not present

## 2022-09-02 LAB — URINALYSIS, ROUTINE W REFLEX MICROSCOPIC
Bilirubin Urine: NEGATIVE
Glucose, UA: NEGATIVE mg/dL
Hgb urine dipstick: NEGATIVE
Ketones, ur: 5 mg/dL — AB
Leukocytes,Ua: NEGATIVE
Nitrite: NEGATIVE
Protein, ur: NEGATIVE mg/dL
Specific Gravity, Urine: 1.014 (ref 1.005–1.030)
pH: 6 (ref 5.0–8.0)

## 2022-09-02 LAB — VALPROIC ACID LEVEL: Valproic Acid Lvl: 68 ug/mL (ref 50.0–100.0)

## 2022-09-02 MED ORDER — HALOPERIDOL LACTATE 5 MG/ML IJ SOLN
5.0000 mg | Freq: Once | INTRAMUSCULAR | Status: AC
  Administered 2022-09-02: 5 mg via INTRAMUSCULAR
  Filled 2022-09-02: qty 1

## 2022-09-02 MED ORDER — ADULT MULTIVITAMIN W/MINERALS CH
1.0000 | ORAL_TABLET | Freq: Every day | ORAL | Status: DC
  Administered 2022-09-03: 1 via ORAL
  Filled 2022-09-02: qty 1

## 2022-09-02 MED ORDER — AMLODIPINE BESYLATE 5 MG PO TABS
5.0000 mg | ORAL_TABLET | Freq: Every day | ORAL | Status: DC
  Administered 2022-09-02 – 2022-09-03 (×2): 5 mg via ORAL
  Filled 2022-09-02 (×2): qty 1

## 2022-09-02 MED ORDER — ATORVASTATIN CALCIUM 40 MG PO TABS
40.0000 mg | ORAL_TABLET | Freq: Every day | ORAL | Status: DC
  Administered 2022-09-02 – 2022-09-03 (×2): 40 mg via ORAL
  Filled 2022-09-02 (×2): qty 1

## 2022-09-02 MED ORDER — LOSARTAN POTASSIUM 50 MG PO TABS
100.0000 mg | ORAL_TABLET | Freq: Every day | ORAL | Status: DC
  Administered 2022-09-02 – 2022-09-03 (×2): 100 mg via ORAL
  Filled 2022-09-02: qty 4
  Filled 2022-09-02: qty 2

## 2022-09-02 MED ORDER — SERTRALINE HCL 50 MG PO TABS
50.0000 mg | ORAL_TABLET | Freq: Every morning | ORAL | Status: DC
  Administered 2022-09-02 – 2022-09-03 (×2): 50 mg via ORAL
  Filled 2022-09-02 (×2): qty 1

## 2022-09-02 MED ORDER — DIVALPROEX SODIUM 125 MG PO CSDR
250.0000 mg | DELAYED_RELEASE_CAPSULE | Freq: Three times a day (TID) | ORAL | Status: DC
  Administered 2022-09-02 – 2022-09-03 (×3): 250 mg via ORAL
  Filled 2022-09-02 (×3): qty 2

## 2022-09-02 MED ORDER — OLANZAPINE 2.5 MG PO TABS
2.5000 mg | ORAL_TABLET | Freq: Two times a day (BID) | ORAL | Status: DC
  Administered 2022-09-02 – 2022-09-03 (×3): 2.5 mg via ORAL
  Filled 2022-09-02 (×3): qty 1

## 2022-09-02 MED ORDER — DAILY VITE PO TABS: 1.0000 | ORAL_TABLET | Freq: Every day | ORAL | Status: DC

## 2022-09-02 MED ORDER — LORAZEPAM 0.5 MG PO TABS
0.5000 mg | ORAL_TABLET | Freq: Three times a day (TID) | ORAL | Status: DC | PRN
  Administered 2022-09-02 (×2): 0.5 mg via ORAL
  Filled 2022-09-02 (×2): qty 1

## 2022-09-02 NOTE — Consult Note (Signed)
BH ED ASSESSMENT   Reason for Consult:  behavioral disturbance  Referring Physician:  Barnie Alderman, PA Patient Identification: TEGH BRYS MRN:  [id number] ED Chief Complaint: Dementia with behavioral disturbance [facility])  Diagnosis:  Principal Problem:   Dementia with behavioral disturbance (HCC) Active Problems:   Aggression   ED Assessment Time Calculation: Start Time: 0945 Stop Time: 1020 Total Time in Minutes (Assessment Completion): 35   HPI:  Per Triage Note: Pt was sent from [facility] for increased aggressive behavior today. No complaints. Pt is calm and cooperative at triage and redirectable.    Subjective: John Buckley, 77 y.o., male patient seen face to face by this provider, consulted with Dr. Lucianne Muss; and chart reviewed on 09/02/22.  On approach patient is laying in hospital bed. During evaluation John Buckley is laying in hospital bed in no acute distress. He is alert, oriented to self only, calm and cooperative. His mood is euthymic with congruent affect. He has normal speech, and appropriate behavior. Thought process is disorganized. Unable to assess thought content, as patient is so disorganized when speaking.  Unable to assess SI/HI/AVH, as he is very disorganized and incoherent also unable to assess how his appetite and sleep were from patient will consult with staff.    Spoke with John Buckley she is the Librarian, academic of [employer], given permission by daughter John Buckley to speak with John Buckley. [name] stated that patient has attempted to strangle staff members, and has made threats that he would kill staff and other residents, he also has been showing his "penis" to male patients per John Buckley. She states that it has become challenging to keep patient at the facility, due to his aggression and his size. Provider discussed with her the medication changes that have been made and how  Ativan can have a disinhibiting effect on patient.  Discussed with her that the patient may benefit from a combination of Valproic Acid and Quetiapine to manage agitation, combativeness and reduce demented behaviors in a person with dementia. [name] stated that she is in agreement with the combination, and asked if we could observe his behavior for 24 hrs while on medication combination.    Spoke with patient's daughter John Buckley, she is in agreement with provider on placing patient on Depakote and Seroquel. Currently the patient is able to return to [facility], in the morning 09/03/22.   Per Chart Review patient has not shown any  aggressive behaviors at this time.    Past Psychiatric History: Dementia with aggressive behavior.   Risk to Self or Others: Is the patient at risk to self? Yes Has the patient been a risk to self in the past 6 months? No Has the patient been a risk to self within the distant past? No Is the patient a risk to others? Yes Has the patient been a risk to others in the past 6 months? No Has the patient been a risk to others within the distant past? No  [place] Scale:  Flowsheet Row ED from 09/01/2022 in [facility] Emergency Department at [facility] ED from 08/20/2022 in [facility] Emergency Department at [facility]  C-SSRS RISK CATEGORY No Risk No Risk       AIMS:  , , ,  ,   ASAM:    Substance Abuse:     Past Medical History:  Past Medical History:  Diagnosis Date   Atrial fibrillation (HCC)    Hyperlipidemia  Hypertension    Long-term (current) use of anticoagulants    Tobacco abuse     Past Surgical History:  Procedure Laterality Date   CARDIAC CATHETERIZATION  01/15/2003   Est. EF is around 65% --  Relatively smooth coronary arteries.  He has a few minor luminal irregularities -- Normal left ventricular systolic function -- Vesta Mixer, M.D.   CARDIOVERSION  08/04/20006   was successful   COLONOSCOPY W/ POLYPECTOMY  12/02/2003   A moderate-sized rectal polyp  at 15 cm -- [name], M.D.   NOSE SURGERY     TONSILLECTOMY     Family History:  Family History  Problem Relation Age of Onset   Alzheimer's disease Mother    Colon cancer Other        family history of     Social History:  Social History   Substance and Sexual Activity  Alcohol Use Yes   Comment: once in a while      Social History   Substance and Sexual Activity  Drug Use No    Social History   Socioeconomic History   Marital status: Widowed    Spouse name: Not on file   Number of children: 2   Years of education: 14   Highest education level: Not on file  Occupational History   Occupation: retired  Tobacco Use   Smoking status: Former    Packs/day: 0.50    Years: 39.00    Additional pack years: 0.00    Total pack years: 19.50    Types: Cigarettes   Smokeless tobacco: Never  Vaping Use   Vaping Use: Never used  Substance and Sexual Activity   Alcohol use: Yes    Comment: once in a while    Drug use: No   Sexual activity: Not on file  Other Topics Concern   Not on file  Social History Narrative   Lives with wife in two story home      Takes frequent walks with dog      Right handed      Highest level of edu- certificate program   Social Determinants of Health   Financial Resource Strain: Not on file  Food Insecurity: Not on file  Transportation Needs: Not on file  Physical Activity: Not on file  Stress: Not on file  Social Connections: Not on file    Allergies:   Allergies  Allergen Reactions   Lisinopril Cough    Labs:  Results for orders placed or performed during the hospital encounter of 09/01/22 (from the past 48 hour(s))  Comprehensive metabolic panel     Status: Abnormal   Collection Time: 09/01/22 10:10 PM  Result Value Ref Range   Sodium 137 135 - 145 mmol/L   Potassium 3.4 (L) 3.5 - 5.1 mmol/L   Chloride 102 98 - 111 mmol/L   CO2 25 22 - 32 mmol/L   Glucose, Bld 119 (H) 70 - 99 mg/dL    Comment: Glucose reference  range applies only to samples taken after fasting for at least 8 hours.   BUN 19 8 - 23 mg/dL   Creatinine, Ser 5.40 0.61 - 1.24 mg/dL   Calcium 8.8 (L) 8.9 - 10.3 mg/dL   Total Protein 6.8 6.5 - 8.1 g/dL   Albumin 3.8 3.5 - 5.0 g/dL   AST 45 (H) 15 - 41 U/L   ALT 33 0 - 44 U/L   Alkaline Phosphatase 70 38 - 126 U/L   Total Bilirubin  1.3 (H) 0.3 - 1.2 mg/dL   GFR, Estimated >11 >91 mL/min    Comment: (NOTE) Calculated using the CKD-EPI Creatinine Equation (2021)    Anion gap 10 5 - 15    Comment: Performed at [facility], [address], [place], [place] [address]  Ethanol     Status: None   Collection Time: 09/01/22 10:10 PM  Result Value Ref Range   Alcohol, Ethyl (B) <10 <10 mg/dL    Comment: (NOTE) Lowest detectable limit for serum alcohol is 10 mg/dL.  For medical purposes only. Performed at [facility], [address] [place], [place] [address]   CBC with Diff     Status: None   Collection Time: 09/01/22 10:10 PM  Result Value Ref Range   WBC 7.3 4.0 - 10.5 K/uL   RBC 4.32 4.22 - 5.81 MIL/uL   Hemoglobin 13.5 13.0 - 17.0 g/dL   HCT 08.6 57.8 - 46.9 %   MCV 93.5 80.0 - 100.0 fL   MCH 31.3 26.0 - 34.0 pg   MCHC 33.4 30.0 - 36.0 g/dL   RDW 62.9 52.8 - 41.3 %   Platelets 178 150 - 400 K/uL   nRBC 0.0 0.0 - 0.2 %   Neutrophils Relative % 62 %   Neutro Abs 4.4 1.7 - 7.7 K/uL   Lymphocytes Relative 26 %   Lymphs Abs 1.9 0.7 - 4.0 K/uL   Monocytes Relative 12 %   Monocytes Absolute 0.9 0.1 - 1.0 K/uL   Eosinophils Relative 0 %   Eosinophils Absolute 0.0 0.0 - 0.5 K/uL   Basophils Relative 0 %   Basophils Absolute 0.0 0.0 - 0.1 K/uL   Immature Granulocytes 0 %   Abs Immature Granulocytes 0.03 0.00 - 0.07 K/uL    Comment: Performed at [facility], [address], [place], [place] [address]  Valproic acid level     Status: None   Collection Time: 09/01/22 10:10 PM  Result Value Ref Range   Valproic Acid  Lvl 68 50.0 - 100.0 ug/mL    Comment: Performed at [facility], [address] [place], [place] [address]  Urinalysis, Routine w reflex microscopic -Urine, Clean Catch     Status: Abnormal   Collection Time: 09/02/22  9:41 AM  Result Value Ref Range   Color, Urine YELLOW YELLOW   APPearance CLEAR CLEAR   Specific Gravity, Urine 1.014 1.005 - 1.030   pH 6.0 5.0 - 8.0   Glucose, UA NEGATIVE NEGATIVE mg/dL   Hgb urine dipstick NEGATIVE NEGATIVE   Bilirubin Urine NEGATIVE NEGATIVE   Ketones, ur 5 (A) NEGATIVE mg/dL   Protein, ur NEGATIVE NEGATIVE mg/dL   Nitrite NEGATIVE NEGATIVE   Leukocytes,Ua NEGATIVE NEGATIVE    Comment: Performed at [facility], [address], [place], [place] [address]    Current Facility-Administered Medications  Medication Dose Route Frequency Provider Last Rate Last Admin   amLODipine (NORVASC) tablet 5 mg  5 mg Oral Daily Tanda Rockers A, DO   5 mg at 09/02/22 1100   apixaban (ELIQUIS) tablet 5 mg  5 mg Oral BID Redwine, Madison A, PA-C   5 mg at 09/02/22 0914   atorvastatin (LIPITOR) tablet 40 mg  40 mg Oral Daily Tanda Rockers A, DO   40 mg at 09/02/22 1059   divalproex (DEPAKOTE SPRINKLE) capsule 250 mg  250 mg Oral Q8H Tanda Rockers A, DO   250 mg at 09/02/22 1350  donepezil (ARICEPT) tablet 10 mg  10 mg Oral QHS Redwine, Madison A, PA-C   10 mg at 09/01/22 2244   LORazepam (ATIVAN) tablet 0.5 mg  0.5 mg Oral Q8H PRN Tanda Rockers A, DO   0.5 mg at 09/02/22 1059   losartan (COZAAR) tablet 100 mg  100 mg Oral Daily Tanda Rockers A, DO   100 mg at 09/02/22 1059   memantine (NAMENDA) tablet 10 mg  10 mg Oral BID Redwine, Madison A, PA-C   10 mg at 09/02/22 0914   [START ON 09/03/2022] multivitamin with minerals tablet 1 tablet  1 tablet Oral Q breakfast Sloan Leiter, DO       OLANZapine (ZYPREXA) tablet 2.5 mg  2.5 mg Oral BID Tanda Rockers A, DO   2.5 mg at 09/02/22 1100   potassium chloride SA (KLOR-CON M) CR tablet 20 mEq   20 mEq Oral Once Redwine, Madison A, PA-C       QUEtiapine (SEROQUEL) tablet 25 mg  25 mg Oral BID PRN Redwine, Madison A, PA-C   25 mg at 09/02/22 0944   sertraline (ZOLOFT) tablet 50 mg  50 mg Oral q AM Tanda Rockers A, DO   50 mg at 09/02/22 1100   Current Outpatient Medications  Medication Sig Dispense Refill   amLODipine (NORVASC) 5 MG tablet Take 5 mg by mouth daily.     apixaban (ELIQUIS) 5 MG TABS tablet Take 1 tablet (5 mg total) by mouth 2 (two) times daily. 180 tablet 3   atorvastatin (LIPITOR) 40 MG tablet Take 1 tablet (40 mg total) by mouth daily. 90 tablet 3   CALPROTECT 0.44-20.6 % OINT Apply 1 application  topically See admin instructions. Apply a liberal amount of ointment to areas of redness on the buttocks 2 times a day     Cholecalciferol (VITAMIN D3) 50 MCG (2000 UT) TABS Take 2,000 Units by mouth daily.     divalproex (DEPAKOTE SPRINKLE) 125 MG capsule Take 2 capsules (250 mg total) by mouth at bedtime. (Patient taking differently: Take 250 mg by mouth 3 (three) times daily.) 60 capsule 0   donepezil (ARICEPT) 10 MG tablet Take 1 tablet (10 mg total) by mouth at bedtime. 30 tablet 0   loperamide (IMODIUM A-D) 2 MG tablet Take 2 mg by mouth 4 (four) times daily as needed for diarrhea or loose stools (not to exceed 4 tablets in 24 hours).     LORazepam (ATIVAN) 0.5 MG tablet Take 0.5 mg by mouth every 8 (eight) hours as needed (for agitation).     LORazepam (ATIVAN) 1 MG tablet Take 1 mg by mouth in the morning and at bedtime.     losartan (COZAAR) 100 MG tablet Take 100 mg by mouth daily.     memantine (NAMENDA) 10 MG tablet Take 1 tablet (10 mg total) by mouth 2 (two) times daily. 60 tablet 0   Multiple Vitamin (DAILY VITE) TABS Take 1 tablet by mouth daily with breakfast.     OLANZapine (ZYPREXA) 2.5 MG tablet Take 2.5 mg by mouth in the morning and at bedtime.     sertraline (ZOLOFT) 25 MG tablet Take 25 mg by mouth in the morning.     sertraline (ZOLOFT) 50 MG tablet  Take 50 mg by mouth in the morning.     divalproex (DEPAKOTE SPRINKLE) 125 MG capsule Take 1 capsule (125 mg total) by mouth 2 (two) times daily at 10 am and 4 pm. (Patient not taking: Reported on 09/01/2022)  60 capsule 0   escitalopram (LEXAPRO) 10 MG tablet Take 1 tablet (10 mg total) by mouth daily. (Patient not taking: Reported on 09/01/2022) 30 tablet 0   QUEtiapine (SEROQUEL) 25 MG tablet Take 1 tablet (25 mg total) by mouth 2 (two) times daily as needed (dementia with behavioral disturbances). (Patient not taking: Reported on 09/01/2022) 45 tablet 0    Musculoskeletal:  Patient observed resting in the bed.   Psychiatric Specialty Exam: Presentation  General Appearance:  Appropriate for Environment  Eye Contact: Fleeting  Speech: Clear and Coherent  Speech Volume: Normal  Handedness:No data recorded  Mood and Affect  Mood: Anxious  Affect: Appropriate   Thought Process  Thought Processes: Disorganized  Descriptions of Associations:Loose  Orientation:Other (comment) (unable to assess, patient has dementia)  Thought Content:Scattered  History of Schizophrenia/Schizoaffective disorder:No  Duration of Psychotic Symptoms:No data recorded Hallucinations:Hallucinations: Other (comment) (unable to assess, patient has dementia)  Ideas of Reference:-- (unable to assess, patient has dementia)  Suicidal Thoughts:Suicidal Thoughts: -- (unable to assess, patient has dementia)  Homicidal Thoughts:Homicidal Thoughts: -- (unable to assess, patient has dementia)   Sensorium  Memory: Immediate Poor; Recent Poor  Judgment: Impaired (unable to assess, patient has dementia)  Insight: Lacking   Executive Functions  Concentration: Fair  Attention Span: Fair  Recall: Poor  Fund of Knowledge: Poor  Language: Poor   Psychomotor Activity  Psychomotor Activity: Psychomotor Activity: Restlessness   Assets  Assets: Manufacturing systems engineer; Social Support;  Housing    Sleep  Sleep: Sleep: Poor   Physical Exam: Physical Exam Cardiovascular:     Rate and Rhythm: Normal rate.  Musculoskeletal:        General: Normal range of motion.  Neurological:     Mental Status: He is alert.  Psychiatric:        Attention and Perception: He is inattentive.        Mood and Affect: Mood is anxious.        Speech: Speech normal.        Behavior: Behavior normal.        Cognition and Memory: Memory is impaired.        Judgment: Judgment is inappropriate.     Comments: Unable to assess thought content, patient has dementia.     Review of Systems  Constitutional: Negative.   HENT: Negative.    Respiratory: Negative.    Psychiatric/Behavioral:  Positive for memory loss.    Blood pressure 130/80, pulse 64, temperature 97.8 F (36.6 C), temperature source Oral, resp. rate 18, height 6\' 1"  (1.854 m), weight 108.9 kg, SpO2 95 %. Body mass index is 31.67 kg/m.   Medical Decision Making: After careful discussion with Dr. Sharen Hones, John Buckley (daughter) and John Buckley staff member at [facility], it was determined that patient may benefit from a combination of Valproic Acid and Quetiapine to manage agitation, combativeness and reduce demented behaviors in a person with dementia.  Lab review shows his LFTs and albumin are within normal limits. Will order Depakote sprinkles 125 mg daily, and 1600, 250 mg at at bedtime.  Seroquel 25 mg p.o. 3 times daily as needed for agitation.    Will monitor overnight and re-evalute in am.       John Buckley, PMHNP 09/02/2022 1:56 PM

## 2022-09-02 NOTE — ED Notes (Signed)
Patient has been restless and trying to leave. He has not exhibited any aggression however. Though redirection is needed often, he is redirectable.

## 2022-09-02 NOTE — ED Notes (Signed)
Patient very restless, pacing room, in and out of bed, trying to hit staff despite night meds and prns.

## 2022-09-02 NOTE — ED Notes (Signed)
Patient agitated fighting staff, security and [employer] called. Patient trying to leave unit, escorted back to room.

## 2022-09-02 NOTE — ED Notes (Signed)
Encouraged patient to drink some fluids and offered him some apple juice. He sips a little and falls back asleep. I woke him up again and asked him to drink some fluids.

## 2022-09-02 NOTE — BH Assessment (Signed)
Clinician reached out to RN Judyann Munson to inquire on patient engaging in an assessment. RN reports patient was heavily medicated and unable to engage in an assessment. RN states he will notify when patient is able to engage.  Manfred Arch, MSW, LCSW Triage Specialist [phone]

## 2022-09-02 NOTE — Evaluation (Addendum)
Physical Therapy Evaluation Patient Details Name: John Buckley MRN: [id number] DOB: 1945/10/13 Today's Date: 09/02/2022  History of Present Illness  77 y.o. male with a past medical history of dementia with behavioral disturbance presenting today from his memory care facility.  He allegedly became aggressive with staff and other patients and they said they can no longer take care of him and sent him to the emergency department.  Clinical Impression  Pt ambulated 200' in the hall with very mild unsteadiness at times, but no overt loss of balance, occasional min hand held assist to steady . Per RN, pt has been ambulating independently in his room without loss of balance.  Pt is pleasantly confused, he is oriented to self only. He was able to follow one step commands with verbal/manual cues, but he was not able to provide his prior level of function. Supervision for safety recommended, assist for medication and ADLs recommended.  No further PT indicated, will sign off. Mobility specialist to follow.      Recommendations for follow up therapy are one component of a multi-disciplinary discharge planning process, led by the attending physician.  Recommendations may be updated based on patient status, additional functional criteria and insurance authorization.  Follow Up Recommendations Can patient physically be transported by private vehicle: Yes     Assistance Recommended at Discharge Intermittent Supervision/Assistance  Patient can return home with the following  A little help with bathing/dressing/bathroom;Assistance with cooking/housework;Direct supervision/assist for medications management;Direct supervision/assist for financial management;Assist for transportation;Help with stairs or ramp for entrance    Equipment Recommendations None recommended by PT  Recommendations for Other Services       Functional Status Assessment Patient has not had a recent decline in their functional status      Precautions / Restrictions Precautions Precautions: Fall Restrictions Weight Bearing Restrictions: No      Mobility  Bed Mobility Overal bed mobility: Independent                  Transfers Overall transfer level: Independent                      Ambulation/Gait Ambulation/Gait assistance: Supervision, Min guard Gait Distance (Feet): 250 Feet Assistive device: 1 person hand held assist, None Gait Pattern/deviations: Decreased stride length, Narrow base of support Gait velocity: WFL     General Gait Details: very mild unsteadiness with no overt loss of balance, occaisional single hand held assist for balance  Stairs            Wheelchair Mobility    Modified Rankin (Stroke Patients Only)       Balance Overall balance assessment: Needs assistance Sitting-balance support: Feet supported, No upper extremity supported Sitting balance-Leahy Scale: Good       Standing balance-Leahy Scale: Good                               Pertinent Vitals/Pain Pain Assessment Pain Assessment: PAINAD Faces Pain Scale: No hurt Breathing: normal Negative Vocalization: none Facial Expression: smiling or inexpressive Body Language: relaxed Consolability: no need to console PAINAD Score: 0    Home Living Family/patient expects to be discharged to:: Unsure                   Additional Comments: pt admitted from memory care unit at ALF per chart    Prior Function Prior Level of Function : Patient poor historian/Family not  available                     Hand Dominance        Extremity/Trunk Assessment   Upper Extremity Assessment Upper Extremity Assessment: Overall WFL for tasks assessed    Lower Extremity Assessment Lower Extremity Assessment: Overall WFL for tasks assessed    Cervical / Trunk Assessment Cervical / Trunk Assessment: Normal;Other exceptions Cervical / Trunk Exceptions: forward head  Communication    Communication: No difficulties  Cognition Arousal/Alertness: Awake/alert Behavior During Therapy: WFL for tasks assessed/performed Overall Cognitive Status: No family/caregiver present to determine baseline cognitive functioning                                 General Comments: pt oriented to self only, can follow 1 step commands with multimodal cues, pt's statements are generally off topic        General Comments      Exercises     Assessment/Plan    PT Assessment Patient does not need any further PT services  PT Problem List         PT Treatment Interventions      PT Goals (Current goals can be found in the Care Plan section)  Acute Rehab PT Goals PT Goal Formulation: All assessment and education complete, DC therapy    Frequency       Co-evaluation               AM-PAC PT "6 Clicks" Mobility  Outcome Measure Help needed turning from your back to your side while in a flat bed without using bedrails?: None Help needed moving from lying on your back to sitting on the side of a flat bed without using bedrails?: None Help needed moving to and from a bed to a chair (including a wheelchair)?: None Help needed standing up from a chair using your arms (e.g., wheelchair or bedside chair)?: None Help needed to walk in hospital room?: None Help needed climbing 3-5 steps with a railing? : A Little 6 Click Score: 23    End of Session Equipment Utilized During Treatment: Gait belt Activity Tolerance: Patient tolerated treatment well Patient left: in bed;with call bell/phone within reach Nurse Communication: Mobility status      Time: 1610-9604 PT Time Calculation (min) (ACUTE ONLY): 11 min   Charges:   PT Evaluation $PT Eval Low Complexity: 1 Low         Tamala Ser PT 09/02/2022  Acute Rehabilitation Services  Office [phone]

## 2022-09-02 NOTE — ED Provider Notes (Signed)
77 year old male here for aggressive behavior. Provided with medication. Counselor attempted assessment however patient was not able to participate secondary to medication. Plan is to reassess in the morning. Physical Exam  BP 131/79   Pulse 85   Temp 97.9 F (36.6 C) (Oral)   Resp 19   Ht 6\' 1"  (1.854 m)   Wt 108.9 kg   SpO2 95%   BMI 31.67 kg/m   Physical Exam  Procedures  Procedures  ED Course / MDM   Clinical Course as of 09/02/22 0553  Thu Sep 01, 2022  2137 Attempted to call the patient's wife with no answer. [MR]  2139 I spoke with the patient's daughter on the phone.  She reports that the aggression and behavior that her father is displaying now is relatively new.  He does have an outpatient neurologist however he does not often see that neurologist and mostly follows with the neuro and memory care team at his facility.  He has had multiple instances of aggression and they notified the family that the facility was no longer comfortable taking care of him.  I asked the patient's daughter if there were anyone else in the family who would be willing to take care of him and tell further placement could take place and she said that nobody felt comfortable caring for him due to his aggression. [MR]    Clinical Course User Index [MR] Redwine, Gabriel Cirri, PA-C   Medical Decision Making Amount and/or Complexity of Data Reviewed Labs: ordered.  Risk Prescription drug management.   Labs without significant findings, pending UA to evaluate for UTI related to behavior change. Care will be signed out to day team pending psych eval, UA and disposition planning with BH and TOC teams.        Jeannie Fend, PA-C 09/02/22 0553    Nira Conn, MD 09/02/22 [phone]

## 2022-09-02 NOTE — Telephone Encounter (Signed)
Pt's daughter Misty Stanley called in wanting to speak with someone about her father. The pt's memory care unit is hesitant to take the pt back with his aggressive behaviors. She would like a call back today. They are trying to discharge him, but this is becoming an issue with the memory care place.

## 2022-09-02 NOTE — ED Notes (Signed)
Family took patient belongings.

## 2022-09-02 NOTE — ED Notes (Signed)
Pt changed into burgundy scrubs and belongings placed in a bag with pt stickers on it and placed in cabinet with 23-25 on it

## 2022-09-02 NOTE — ED Provider Notes (Signed)
Pending UA for medical clearance then psych eval, SW, CM, PT/OT.   From facility that will not take him d/t aggressive behaviors.   Physical Exam  BP (!) 161/102 (BP Location: Left Arm)   Pulse 64   Temp (!) 97.3 F (36.3 C) (Oral)   Resp (!) 22   Ht 6\' 1"  (1.854 m)   Wt 108.9 kg   SpO2 96%   BMI 31.67 kg/m   Physical Exam Vitals and nursing note reviewed.  Constitutional:      General: He is not in acute distress.    Appearance: He is well-developed.  HENT:     Head: Normocephalic and atraumatic.  Eyes:     Conjunctiva/sclera: Conjunctivae normal.  Cardiovascular:     Rate and Rhythm: Normal rate and regular rhythm.     Heart sounds: No murmur heard. Pulmonary:     Effort: Pulmonary effort is normal. No respiratory distress.     Breath sounds: Normal breath sounds.  Abdominal:     Palpations: Abdomen is soft.     Tenderness: There is no abdominal tenderness.  Musculoskeletal:        General: No swelling.     Cervical back: Neck supple.  Skin:    General: Skin is warm and dry.     Capillary Refill: Capillary refill takes less than 2 seconds.  Neurological:     Mental Status: He is alert.  Psychiatric:        Mood and Affect: Mood normal.     Procedures  Procedures  ED Course / MDM   Clinical Course as of 09/02/22 0644  Thu Sep 01, 2022  2137 Attempted to call the patient's wife with no answer. [MR]  2139 I spoke with the patient's daughter on the phone.  She reports that the aggression and behavior that her father is displaying now is relatively new.  He does have an outpatient neurologist however he does not often see that neurologist and mostly follows with the neuro and memory care team at his facility.  He has had multiple instances of aggression and they notified the family that the facility was no longer comfortable taking care of him.  I asked the patient's daughter if there were anyone else in the family who would be willing to take care of him and tell  further placement could take place and she said that nobody felt comfortable caring for him due to his aggression. [MR]    Clinical Course User Index [MR] Redwine, Gabriel Cirri, PA-C   Medical Decision Making Patient is a 77 year old male, here for aggressive behavior at the facility.  Pending urinalysis for medical clearance.  Reviewed urinalysis, unremarkable Hu, we will add TTS CM for further evaluation.  Will likely need placement.  I spoke with the manager of the facility, and she is requesting that someone call her, specifically psychiatry once they have their recommendations or if they need collateral.  Phone # is [phone].  Patient to be seen by TTS.   Amount and/or Complexity of Data Reviewed Labs: ordered.  Risk Prescription drug management.        Pete Pelt, [place] 09/02/22 1006    Tanda Rockers A, DO 09/03/22 1927

## 2022-09-02 NOTE — Progress Notes (Signed)
This CSW was informed by Pysch NP that she has spoken with the executive director at [facility] and they are willing to accept pt back in the morning. Pysch NP informed ED of medication adjustments and will outreach to pt's daughter to do the same. TOC will coordinate d/c in the morning.

## 2022-09-03 DIAGNOSIS — F03918 Unspecified dementia, unspecified severity, with other behavioral disturbance: Secondary | ICD-10-CM

## 2022-09-03 MED ORDER — OLANZAPINE 2.5 MG PO TABS: 2.5000 mg | ORAL_TABLET | Freq: Two times a day (BID) | ORAL | 0 refills | Status: AC | PRN

## 2022-09-03 MED ORDER — LORAZEPAM 0.5 MG PO TABS: 0.5000 mg | ORAL_TABLET | Freq: Three times a day (TID) | ORAL | 0 refills | Status: AC | PRN

## 2022-09-03 MED ORDER — OLANZAPINE 5 MG PO TBDP: 15.0000 mg | ORAL_TABLET | Freq: Two times a day (BID) | ORAL | Status: DC | PRN

## 2022-09-03 MED ORDER — OLANZAPINE 5 MG PO TBDP: 5.0000 mg | ORAL_TABLET | Freq: Two times a day (BID) | ORAL | Status: DC | PRN

## 2022-09-03 MED ORDER — DIVALPROEX SODIUM 125 MG PO CSDR: 250.0000 mg | DELAYED_RELEASE_CAPSULE | Freq: Three times a day (TID) | ORAL | 0 refills | Status: AC

## 2022-09-03 MED ORDER — OLANZAPINE 2.5 MG PO TABS: 2.5000 mg | ORAL_TABLET | Freq: Two times a day (BID) | ORAL | 0 refills | Status: AC

## 2022-09-03 MED ORDER — SERTRALINE HCL 50 MG PO TABS: 50.0000 mg | ORAL_TABLET | Freq: Every day | ORAL | 0 refills | Status: AC

## 2022-09-03 NOTE — Progress Notes (Addendum)
This CSW outreached to [employer] and spoke with Diplomatic Services operational officer. This CSW inquired about any information needed other than AVS prior to pt's discharge. Secretary reported that Librarian, academic, Henreitta Cea, was not in at the moment but she will have the manager, Kelton Pillar, give this CSW a call back.

## 2022-09-03 NOTE — ED Notes (Signed)
PTAR able to collect and deliver belongings to pt.

## 2022-09-03 NOTE — Discharge Summary (Signed)
[facility] Psych ED Discharge  09/03/2022 11:16 AM John Buckley  MRN:  [id number]  Principal Problem: Dementia with behavioral disturbance [facility]) Discharge Diagnoses: Principal Problem:   Dementia with behavioral disturbance (HCC) Active Problems:   Aggression  Clinical Impression:  Final diagnoses:  Dementia with agitation, unspecified dementia severity, unspecified dementia type [facility])    ED Assessment Time Calculation: Start Time: 1030 Stop Time: 1050 Total Time in Minutes (Assessment Completion): 20   Subjective: On evaluation today, the patient laying in bed. He is calm and slightly drowsy during this assessment. His appearance is appropriate for environment. Her eye contact is good.  Speech is clear and coherent, normal pace and normal volume.  She reports her mood is euthymic. Affect is congruent with mood.  Thought process is coherent and disorganized. Thought content is slightly scattered. He is alert, oriented to self only. His mood is euthymic with congruent affect. He has normal speech, and appropriate behavior. Thought process is disorganized. Unable to assess thought content, as patient is so disorganized when speaking. Unable to assess SI/HI/AVH, as he is very disorganized and incoherent.  Support, encouragement and reassurance provided about ongoing stressors and patient provided with opportunity for questions.   Spoke with [facility] the Interior and spatial designer at [facility] and she asked about patient's behavior during her stay at the emergency department.  She stated that she spoke with patient's family's daughter John Buckley, they stated that we will have the patient a 1-1 to help manage patient's behavior.  Provider discussed the medication changes that were made for patient, Zyprexa 2.5 mg twice daily as needed has been added for increased agitation, informed Ms. Charity that if  Ativan is used please make the last resort and use be Zyprexa as needed first.  Discussed with Charity that  Ativan disinhibits geriatric and increases the chances of falls, discussed patient having routine lab work done while being on antipsychotic, informed her that antipsychotic are more tolerated in geriatric with behavioral disturbances associated with dementia.  Spoke with patient's daughter John Buckley, informed her that patient will be returning to [facility] today and she asked that she be called once EMS has picked patient up to return to [facility], so that she may visit patient today at [facility].    Past Psychiatric History: Dementia with aggressive behavior.     Past Medical History:  Past Medical History:  Diagnosis Date   Atrial fibrillation (HCC)    Hyperlipidemia    Hypertension    Long-term (current) use of anticoagulants    Tobacco abuse     Past Surgical History:  Procedure Laterality Date   CARDIAC CATHETERIZATION  01/15/2003   Est. EF is around 65% --  Relatively smooth coronary arteries.  He has a few minor luminal irregularities -- Normal left ventricular systolic function -- Vesta Mixer, M.D.   CARDIOVERSION  08/04/20006   was successful   COLONOSCOPY W/ POLYPECTOMY  12/02/2003   A moderate-sized rectal polyp at 15 cm -- [name], M.D.   NOSE SURGERY     TONSILLECTOMY     Family History:  Family History  Problem Relation Age of Onset   Alzheimer's disease Mother    Colon cancer Other        family history of     Social History:  Social History   Substance and Sexual Activity  Alcohol Use Yes   Comment: once in a while      Social History   Substance and Sexual  Activity  Drug Use No    Social History   Socioeconomic History   Marital status: Widowed    Spouse name: Not on file   Number of children: 2   Years of education: 14   Highest education level: Not on file  Occupational History   Occupation: retired  Tobacco Use   Smoking status: Former    Packs/day: 0.50    Years: 39.00    Additional pack years: 0.00     Total pack years: 19.50    Types: Cigarettes   Smokeless tobacco: Never  Vaping Use   Vaping Use: Never used  Substance and Sexual Activity   Alcohol use: Yes    Comment: once in a while    Drug use: No   Sexual activity: Not on file  Other Topics Concern   Not on file  Social History Narrative   Lives with wife in two story home      Takes frequent walks with dog      Right handed      Highest level of edu- certificate program   Social Determinants of Health   Financial Resource Strain: Not on file  Food Insecurity: Not on file  Transportation Needs: Not on file  Physical Activity: Not on file  Stress: Not on file  Social Connections: Not on file    Tobacco Cessation:  N/A, patient does not currently use tobacco products  Current Medications: Current Facility-Administered Medications  Medication Dose Route Frequency Provider Last Rate Last Admin   amLODipine (NORVASC) tablet 5 mg  5 mg Oral Daily Tanda Rockers A, DO   5 mg at 09/03/22 1031   apixaban (ELIQUIS) tablet 5 mg  5 mg Oral BID Redwine, Madison A, PA-C   5 mg at 09/03/22 1032   atorvastatin (LIPITOR) tablet 40 mg  40 mg Oral Daily Tanda Rockers A, DO   40 mg at 09/03/22 1030   divalproex (DEPAKOTE SPRINKLE) capsule 250 mg  250 mg Oral Q8H Tanda Rockers A, DO   250 mg at 09/03/22 0635   donepezil (ARICEPT) tablet 10 mg  10 mg Oral QHS Redwine, Madison A, PA-C   10 mg at 09/02/22 2151   LORazepam (ATIVAN) tablet 0.5 mg  0.5 mg Oral Q8H PRN Tanda Rockers A, DO   0.5 mg at 09/02/22 2016   losartan (COZAAR) tablet 100 mg  100 mg Oral Daily Tanda Rockers A, DO   100 mg at 09/03/22 1031   memantine (NAMENDA) tablet 10 mg  10 mg Oral BID Redwine, Madison A, PA-C   10 mg at 09/03/22 1031   multivitamin with minerals tablet 1 tablet  1 tablet Oral Q breakfast Tanda Rockers A, DO   1 tablet at 09/03/22 0756   OLANZapine (ZYPREXA) tablet 2.5 mg  2.5 mg Oral BID Tanda Rockers A, DO   2.5 mg at 09/03/22 1030   OLANZapine zydis  (ZYPREXA) disintegrating tablet 5 mg  5 mg Oral BID PRN Motley-Mangrum, Ellinor Test A, PMHNP       potassium chloride SA (KLOR-CON M) CR tablet 20 mEq  20 mEq Oral Once Redwine, Madison A, PA-C       sertraline (ZOLOFT) tablet 50 mg  50 mg Oral q AM Tanda Rockers A, DO   50 mg at 09/03/22 0757   Current Outpatient Medications  Medication Sig Dispense Refill   amLODipine (NORVASC) 5 MG tablet Take 5 mg by mouth daily.     apixaban (ELIQUIS) 5 MG TABS tablet Take  1 tablet (5 mg total) by mouth 2 (two) times daily. 180 tablet 3   atorvastatin (LIPITOR) 40 MG tablet Take 1 tablet (40 mg total) by mouth daily. 90 tablet 3   CALPROTECT 0.44-20.6 % OINT Apply 1 application  topically See admin instructions. Apply a liberal amount of ointment to areas of redness on the buttocks 2 times a day     Cholecalciferol (VITAMIN D3) 50 MCG (2000 UT) TABS Take 2,000 Units by mouth daily.     donepezil (ARICEPT) 10 MG tablet Take 1 tablet (10 mg total) by mouth at bedtime. 30 tablet 0   loperamide (IMODIUM A-D) 2 MG tablet Take 2 mg by mouth 4 (four) times daily as needed for diarrhea or loose stools (not to exceed 4 tablets in 24 hours).     LORazepam (ATIVAN) 0.5 MG tablet Take 1 tablet (0.5 mg total) by mouth every 8 (eight) hours as needed for up to 5 days (agitation, please use as last resort, please try Zyprexa 2.5 mg 1st). 5 tablet 0   losartan (COZAAR) 100 MG tablet Take 100 mg by mouth daily.     memantine (NAMENDA) 10 MG tablet Take 1 tablet (10 mg total) by mouth 2 (two) times daily. 60 tablet 0   Multiple Vitamin (DAILY VITE) TABS Take 1 tablet by mouth daily with breakfast.     OLANZapine (ZYPREXA) 2.5 MG tablet Take 1 tablet (2.5 mg total) by mouth 2 (two) times daily as needed for up to 14 days. 14 tablet 0   sertraline (ZOLOFT) 50 MG tablet Take 1 tablet (50 mg total) by mouth daily for 14 days. 14 tablet 0   divalproex (DEPAKOTE SPRINKLE) 125 MG capsule Take 2 capsules (250 mg total) by mouth every 8  (eight) hours. 14 capsule 0   OLANZapine (ZYPREXA) 2.5 MG tablet Take 1 tablet (2.5 mg total) by mouth in the morning and at bedtime for 4 days. 7 tablet 0   PTA Medications: (Not in a hospital admission)   [place] Scale:  Flowsheet Row ED from 09/01/2022 in [facility] Emergency Department at [facility] ED from 08/20/2022 in [facility] Emergency Department at [facility]  C-SSRS RISK CATEGORY No Risk No Risk       Musculoskeletal: Strength & Muscle Tone: within normal limits Gait & Station: normal Patient leans: N/A  Psychiatric Specialty Exam: Presentation  General Appearance:  Disheveled  Eye Contact: Fleeting  Speech: Clear and Coherent  Speech Volume: Normal  Handedness: Right   Mood and Affect  Mood: Anxious  Affect: Appropriate   Thought Process  Thought Processes: Disorganized  Descriptions of Associations:Loose  Orientation:Other (comment) (oriented to self only)  Thought Content:Scattered; Tangential  History of Schizophrenia/Schizoaffective disorder:No  Duration of Psychotic Symptoms:No data recorded Hallucinations:Hallucinations: Other (comment) (unable to assess)  Ideas of Reference:Other (comment) (unable to assess)  Suicidal Thoughts:Suicidal Thoughts: -- (unable to assess)  Homicidal Thoughts:Homicidal Thoughts: -- (unable to assess)   Sensorium  Memory: Immediate Fair; Recent Fair; Remote Fair  Judgment: Fair  Insight: Fair   Art therapist  Concentration: Poor  Attention Span: Poor  Recall: Poor  Fund of Knowledge: Fair  Language: Fair   Psychomotor Activity  Psychomotor Activity: Psychomotor Activity: Normal   Assets  Assets: Manufacturing systems engineer; Housing; Social Support   Sleep  Sleep: Sleep: Fair    Physical Exam: Physical Exam Vitals and nursing note reviewed. Exam conducted with a chaperone present.  Musculoskeletal:        General:  Normal range of motion.      Cervical back: Normal range of motion.  Neurological:     Mental Status: He is alert.  Psychiatric:        Attention and Perception: He is inattentive.        Mood and Affect: Mood normal.        Speech: Speech normal.        Behavior: Behavior is cooperative.        Cognition and Memory: Memory is impaired.        Judgment: Judgment is inappropriate.    Review of Systems  Constitutional: Negative.   HENT: Negative.    Respiratory: Negative.    Psychiatric/Behavioral:  Positive for memory loss.    Blood pressure (!) 154/90, pulse 75, temperature 97.7 F (36.5 C), resp. rate 13, height 6\' 1"  (1.854 m), weight 108.9 kg, SpO2 99 %. Body mass index is 31.67 kg/m.   Demographic Factors:  Male, Age 47 or older, Divorced or widowed, and Caucasian  Loss Factors: Loss of significant relationship  Historical Factors: Impulsivity  Risk Reduction Factors:   Positive social support  Continued Clinical Symptoms:  Depression:   Anhedonia  Cognitive Features That Contribute To Risk:  Thought constriction (tunnel vision)    Suicide Risk:  Minimal: No identifiable suicidal ideation.  Patients presenting with no risk factors but with morbid ruminations; may be classified as minimal risk based on the severity of the depressive symptoms    Medical Decision Making: Patient observed to be laying in his bed comfortably on morning rounds. It is felt that patient may need higher level of care due to worsening aggression, depression, isolation, and dementia. Patient behaviors are consistent with a person with dementia and other memory loss. memory care unit would be appropriate as they are well equipped to help elderly persons with dementia, maintain cognitive skills and help with quality of life.  Prescription sent with patient for Zyprexa 2.5 mg twice daily as needed for agitation, Zyprexa 2.5 mg twice daily morning and night for behavioral disturbances associated with dementia,  and  sertraline 50 mg p.o. daily for depression. Patient not recommended for psychiatric inpatient admission but for a memory care facility.  Patient is psychiatrically cleared.    Disposition: Patient does not meet criteria for psychiatric inpatient admission. Supportive therapy provided about ongoing stressors.    Azazel Franze MOTLEY-MANGRUM, PMHNP 09/03/2022, 11:16 AM

## 2022-09-03 NOTE — ED Notes (Signed)
PT belongings were left at hospital, [facility] dispatch called, told they would try to contact truck and return to pick up pts belongings.

## 2022-09-03 NOTE — ED Notes (Signed)
Patie

## 2022-09-03 NOTE — ED Notes (Signed)
Patient is currently still sleeping and has slept for 4 hours

## 2022-09-03 NOTE — ED Notes (Signed)
Multiple attempts made to contact receiving nurse at [facility] memory care without success. PTAR informed that I was unable to make contact, contact number and my name was given to PTAR to pass to receiving staff if they would like to follow up.

## 2022-09-03 NOTE — Discharge Instructions (Signed)

## 2022-09-03 NOTE — Progress Notes (Addendum)
This CSW received a call back from [employer], who is the Director of Memory Care. Informed Charity of conversation between Psych NP and Librarian, academic. Charity was unaware but is fine with the pt returning as long as orders are sent for medications. This CSW requested that Psych NP contact [facility] and she has done so. PTAR has been contacted. Daughter is aware that pt will be returning. No further TOC needs.   Addend @ 12:03 PM Notified pt's daughter that pt has discharged.

## 2022-09-13 ENCOUNTER — Emergency Department (HOSPITAL_COMMUNITY): Payer: Medicare Other

## 2022-09-13 ENCOUNTER — Emergency Department (HOSPITAL_COMMUNITY)
Admission: EM | Admit: 2022-09-13 | Discharge: 2022-09-13 | Disposition: A | Discharge status: Home / Self Care | Payer: Medicare Other | Attending: Emergency Medicine | Admitting: Emergency Medicine

## 2022-09-13 ENCOUNTER — Other Ambulatory Visit: Payer: Self-pay

## 2022-09-13 ENCOUNTER — Encounter (HOSPITAL_COMMUNITY): Payer: Self-pay | Admitting: Pharmacy Technician

## 2022-09-13 DIAGNOSIS — S0990XA Unspecified injury of head, initial encounter: Secondary | ICD-10-CM | POA: Diagnosis not present

## 2022-09-13 DIAGNOSIS — W19XXXA Unspecified fall, initial encounter: Secondary | ICD-10-CM | POA: Diagnosis not present

## 2022-09-13 DIAGNOSIS — Z7901 Long term (current) use of anticoagulants: Secondary | ICD-10-CM | POA: Insufficient documentation

## 2022-09-13 DIAGNOSIS — Z79899 Other long term (current) drug therapy: Secondary | ICD-10-CM | POA: Insufficient documentation

## 2022-09-13 DIAGNOSIS — I4891 Unspecified atrial fibrillation: Secondary | ICD-10-CM | POA: Diagnosis not present

## 2022-09-13 DIAGNOSIS — F0390 Unspecified dementia without behavioral disturbance: Secondary | ICD-10-CM | POA: Diagnosis not present

## 2022-09-13 LAB — COMPREHENSIVE METABOLIC PANEL
ALT: 21 U/L (ref 0–44)
AST: 33 U/L (ref 15–41)
Albumin: 3.4 g/dL — ABNORMAL LOW (ref 3.5–5.0)
Alkaline Phosphatase: 70 U/L (ref 38–126)
Anion gap: 8 (ref 5–15)
BUN: 15 mg/dL (ref 8–23)
CO2: 25 mmol/L (ref 22–32)
Calcium: 8.5 mg/dL — ABNORMAL LOW (ref 8.9–10.3)
Chloride: 104 mmol/L (ref 98–111)
Creatinine, Ser: 0.88 mg/dL (ref 0.61–1.24)
GFR, Estimated: >60 mL/min (ref >60)
Glucose, Bld: 103 mg/dL — ABNORMAL HIGH (ref 70–99)
Potassium: 3.5 mmol/L (ref 3.5–5.1)
Sodium: 137 mmol/L (ref 135–145)
Total Bilirubin: 0.9 mg/dL (ref 0.3–1.2)
Total Protein: 5.8 g/dL — ABNORMAL LOW (ref 6.5–8.1)

## 2022-09-13 LAB — RAPID URINE DRUG SCREEN, HOSP PERFORMED
Amphetamines: NOT DETECTED
Barbiturates: NOT DETECTED
Benzodiazepines: POSITIVE — AB
Cocaine: NOT DETECTED
Opiates: NOT DETECTED
Tetrahydrocannabinol: NOT DETECTED

## 2022-09-13 LAB — ETHANOL: Alcohol, Ethyl (B): <10 mg/dL (ref <10)

## 2022-09-13 LAB — URINALYSIS, ROUTINE W REFLEX MICROSCOPIC
Bilirubin Urine: NEGATIVE
Glucose, UA: NEGATIVE mg/dL
Ketones, ur: NEGATIVE mg/dL
Leukocytes,Ua: NEGATIVE
Nitrite: NEGATIVE
Protein, ur: NEGATIVE mg/dL
RBC / HPF: >50 RBC/hpf (ref 0–5)
Specific Gravity, Urine: 1.01 (ref 1.005–1.030)
pH: 7 (ref 5.0–8.0)

## 2022-09-13 LAB — CBC
HCT: 38.9 % — ABNORMAL LOW (ref 39.0–52.0)
Hemoglobin: 12.8 g/dL — ABNORMAL LOW (ref 13.0–17.0)
MCH: 30.8 pg (ref 26.0–34.0)
MCHC: 32.9 g/dL (ref 30.0–36.0)
MCV: 93.7 fL (ref 80.0–100.0)
Platelets: 191 10*3/uL (ref 150–400)
RBC: 4.15 MIL/uL — ABNORMAL LOW (ref 4.22–5.81)
RDW: 13.6 % (ref 11.5–15.5)
WBC: 6.8 10*3/uL (ref 4.0–10.5)
nRBC: 0 % (ref 0.0–0.2)

## 2022-09-13 MED ORDER — DROPERIDOL 2.5 MG/ML IJ SOLN
5.0000 mg | Freq: Once | INTRAMUSCULAR | Status: AC
  Administered 2022-09-13: 5 mg via INTRAVENOUS
  Filled 2022-09-13: qty 2

## 2022-09-13 MED ORDER — HALOPERIDOL LACTATE 5 MG/ML IJ SOLN
5.0000 mg | Freq: Once | INTRAMUSCULAR | Status: AC
  Administered 2022-09-13: 5 mg via INTRAMUSCULAR
  Filled 2022-09-13: qty 1

## 2022-09-13 MED ORDER — LORAZEPAM 2 MG/ML IJ SOLN
2.0000 mg | Freq: Once | INTRAMUSCULAR | Status: AC
  Administered 2022-09-13: 2 mg via INTRAVENOUS
  Filled 2022-09-13: qty 1

## 2022-09-13 MED ORDER — DIPHENHYDRAMINE HCL 50 MG/ML IJ SOLN
25.0000 mg | Freq: Once | INTRAMUSCULAR | Status: AC
  Administered 2022-09-13: 25 mg via INTRAVENOUS
  Filled 2022-09-13: qty 1

## 2022-09-13 MED ORDER — MIDAZOLAM HCL 2 MG/2ML IJ SOLN
2.0000 mg | Freq: Once | INTRAMUSCULAR | Status: AC
  Administered 2022-09-13: 2 mg via INTRAMUSCULAR
  Filled 2022-09-13: qty 2

## 2022-09-13 NOTE — ED Notes (Signed)
Pt trying to put legs over railing to bed, pt redirected to lie back on mattress by RN and techs.

## 2022-09-13 NOTE — ED Notes (Signed)
Pt brought back to hallway in front of nurse's station for visualization after being cleaned.  Pt given new warm blankets and redirected to stay in bed.  Pt resting comfortably.  Secretary informed to call PTAR for transport back to facility for discharge.

## 2022-09-13 NOTE — ED Notes (Signed)
Pt needs redirecting multiple times but is able to be redirected briefly and lying comfortably in bed with blankets in place.  Pt in stretcher by nurse's station for direct visualization.

## 2022-09-13 NOTE — ED Notes (Signed)
[place] placed 

## 2022-09-13 NOTE — ED Notes (Signed)
Non-violent restraints never used on patient.  Patient able to be redirected to lie in bed when attempting to get up.

## 2022-09-13 NOTE — ED Notes (Signed)
Pts CT delayed due to pt agitation and x2 CTs being shut down.

## 2022-09-13 NOTE — ED Notes (Signed)
Attempted to call report x3 to [facility] without answer.

## 2022-09-13 NOTE — ED Triage Notes (Signed)
Pt bib ems from [facility] after unwitnessed fall. Staff did witness patient was able to get up on his own. Pt more agitated than normal per staff. GCS 14 which is baseline, hx dementia. No obvious injury or sign of trauma. Pt placed in ccollar.  114/64 HR 70 97% RA 115 CBG

## 2022-09-13 NOTE — ED Provider Notes (Signed)
EMERGENCY DEPARTMENT AT [facility] Provider Note   CSN: [id number] Arrival date & time: 09/13/22  1454     History {Add pertinent medical, surgical, social history, OB history to HPI:1} Chief Complaint  Patient presents with   John Buckley is a 77 y.o. male.  77 yo M with hx of dementia and atrial fibrillation on eliquis who presents to the ED after a fall.  Patient had an unwitnessed fall at his facility.  Unsure if head strike occurred or not.  Patient was able to walk after the incident.  Is on Eliquis.  Has been more agitated than usual recently and has been getting frequent Ativan.  Patient denies any pain to me aside from some mild neck pain.  Placed in c-collar by EMS on arrival to the facility.       Home Medications Prior to Admission medications   Medication Sig Start Date End Date Taking? Authorizing Provider  amLODipine (NORVASC) 5 MG tablet Take 5 mg by mouth daily.    [provider]  apixaban (ELIQUIS) 5 MG TABS tablet Take 1 tablet (5 mg total) by mouth 2 (two) times daily. 06/21/22   Nahser, Deloris Ping, MD  atorvastatin (LIPITOR) 40 MG tablet Take 1 tablet (40 mg total) by mouth daily. 05/10/11   Nahser, Deloris Ping, MD  CALPROTECT 0.44-20.6 % OINT Apply 1 application  topically See admin instructions. Apply a liberal amount of ointment to areas of redness on the buttocks 2 times a day    [provider]  Cholecalciferol (VITAMIN D3) 50 MCG (2000 UT) TABS Take 2,000 Units by mouth daily.    [provider]  divalproex (DEPAKOTE SPRINKLE) 125 MG capsule Take 2 capsules (250 mg total) by mouth every 8 (eight) hours. 09/03/22   Motley-Mangrum, Geralynn Ochs A, PMHNP  donepezil (ARICEPT) 10 MG tablet Take 1 tablet (10 mg total) by mouth at bedtime. 08/22/22 09/21/22  Earney Navy, NP  loperamide (IMODIUM A-D) 2 MG tablet Take 2 mg by mouth 4 (four) times daily as needed for diarrhea or loose stools (not to exceed 4 tablets  in 24 hours).    [provider]  losartan (COZAAR) 100 MG tablet Take 100 mg by mouth daily.    [provider]  memantine (NAMENDA) 10 MG tablet Take 1 tablet (10 mg total) by mouth 2 (two) times daily. 08/22/22 09/21/22  Earney Navy, NP  Multiple Vitamin (DAILY VITE) TABS Take 1 tablet by mouth daily with breakfast.    [provider]  OLANZapine (ZYPREXA) 2.5 MG tablet Take 1 tablet (2.5 mg total) by mouth in the morning and at bedtime for 4 days. 09/03/22 09/07/22  Motley-Mangrum, Ezra Sites, PMHNP  OLANZapine (ZYPREXA) 2.5 MG tablet Take 1 tablet (2.5 mg total) by mouth 2 (two) times daily as needed for up to 14 days. 09/03/22 09/17/22  Motley-Mangrum, Ezra Sites, PMHNP  sertraline (ZOLOFT) 50 MG tablet Take 1 tablet (50 mg total) by mouth daily for 14 days. 09/03/22 09/17/22  Motley-Mangrum, Ezra Sites, PMHNP      Allergies    Lisinopril    Review of Systems   Review of Systems  Physical Exam Updated Vital Signs BP 120/77   Pulse 72   Temp 97.9 F (36.6 C)   Resp 16   Ht 6\' 1"  (1.854 m)   Wt 108.9 kg   SpO2 100%   BMI 31.66 kg/m  Physical Exam Constitutional:  General: He is not in acute distress.    Appearance: Normal appearance. He is not ill-appearing.  HENT:     Head: Normocephalic and atraumatic.     Right Ear: External ear normal.     Left Ear: External ear normal.     Mouth/Throat:     Mouth: Mucous membranes are moist.     Pharynx: Oropharynx is clear.  Eyes:     Extraocular Movements: Extraocular movements intact.     Conjunctiva/sclera: Conjunctivae normal.     Pupils: Pupils are equal, round, and reactive to light.  Neck:     Comments: C-collar in place.  Midline C-spine tenderness to palpation Cardiovascular:     Rate and Rhythm: Normal rate and regular rhythm.     Pulses: Normal pulses.     Heart sounds: Normal heart sounds.  Pulmonary:     Effort: Pulmonary effort is normal. No respiratory distress.     Breath sounds: Normal  breath sounds.  Abdominal:     General: Abdomen is flat. There is no distension.     Palpations: Abdomen is soft. There is no mass.     Tenderness: There is no abdominal tenderness. There is no guarding.  Musculoskeletal:        General: No deformity. Normal range of motion.     Comments: No tenderness to palpation of midline thoracic or lumbar spine.  No step-offs palpated.  No tenderness to palpation of chest wall.  No bruising noted.  No tenderness to palpation of bilateral clavicles.  No tenderness to palpation, bruising, or deformities noted of bilateral shoulders, elbows, wrists, hips, knees, or ankles.  Neurological:     General: No focal deficit present.     Mental Status: He is alert and oriented to person, place, and time. Mental status is at baseline.     Cranial Nerves: No cranial nerve deficit.     Sensory: No sensory deficit.     Motor: No weakness.     ED Results / Procedures / Treatments   Labs (all labs ordered are listed, but only abnormal results are displayed) Labs Reviewed  COMPREHENSIVE METABOLIC PANEL  CBC  ETHANOL  URINALYSIS, ROUTINE W REFLEX MICROSCOPIC  RAPID URINE DRUG SCREEN, HOSP PERFORMED    EKG None  Radiology No results found.  Procedures Procedures  {Document cardiac monitor, telemetry assessment procedure when appropriate:1}  Medications Ordered in ED Medications  midazolam (VERSED) injection 2 mg (2 mg Intramuscular Given 09/13/22 1520)  haloperidol lactate (HALDOL) injection 5 mg (5 mg Intramuscular Given 09/13/22 1520)    ED Course/ Medical Decision Making/ A&P   {   Click here for ABCD2, HEART and other calculatorsREFRESH Note before signing :1}                          Medical Decision Making Amount and/or Complexity of Data Reviewed Labs: ordered. Radiology: ordered.  Risk Prescription drug management.   ***  {Document critical care time when appropriate:1} {Document review of labs and clinical decision tools ie  heart score, Chads2Vasc2 etc:1}  {Document your independent review of radiology images, and any outside records:1} {Document your discussion with family members, caretakers, and with consultants:1} {Document social determinants of health affecting pt's care:1} {Document your decision making why or why not admission, treatments were needed:1} Final Clinical Impression(s) / ED Diagnoses Final diagnoses:  None    Rx / DC Orders ED Discharge Orders     None

## 2022-09-13 NOTE — Discharge Instructions (Addendum)
You were seen for your fall in the emergency department.  No traumatic injuries were found.  At home, please continue your medications.    Check your MyChart online for the results of any tests that had not resulted by the time you left the emergency department.   Follow-up with your primary doctor in 2-3 days regarding your visit.    Return immediately to the emergency department if you experience any of the following: Recurrent falls, or any other concerning symptoms.    Thank you for visiting our Emergency Department. It was a pleasure taking care of you today.

## 2022-09-13 NOTE — ED Notes (Signed)
Ptar called. No eta 

## 2022-10-17 ENCOUNTER — Ambulatory Visit: Payer: Medicare Other | Admitting: Physician Assistant

## 2022-11-15 ENCOUNTER — Ambulatory Visit: Payer: Self-pay | Admitting: Cardiovascular Disease

## 2023-09-17 DEATH — deceased
